# Patient Record
Sex: Male | Born: 1975 | Race: White | Hispanic: No | Marital: Single | Smoking: Former smoker
Health system: Southern US, Community
[De-identification: ages and names within clinical notes are randomized; demographics above are authoritative.]

## PROBLEM LIST (undated history)

## (undated) DIAGNOSIS — T07XXXA Unspecified multiple injuries, initial encounter: Secondary | ICD-10-CM

## (undated) HISTORY — DX: Unspecified multiple injuries, initial encounter: T07.XXXA

---

## 2004-05-05 ENCOUNTER — Ambulatory Visit (HOSPITAL_COMMUNITY): Admission: RE | Admit: 2004-05-05 | Discharge: 2004-05-05 | Payer: Self-pay | Admitting: Neurosurgery

## 2004-05-26 ENCOUNTER — Ambulatory Visit: Payer: Self-pay | Admitting: Cardiovascular Disease

## 2004-06-03 ENCOUNTER — Ambulatory Visit: Payer: Self-pay

## 2004-06-05 ENCOUNTER — Encounter: Admission: RE | Admit: 2004-06-05 | Discharge: 2004-06-05 | Payer: Self-pay | Admitting: Neurosurgery

## 2004-07-02 ENCOUNTER — Ambulatory Visit: Payer: Self-pay | Admitting: Cardiovascular Disease

## 2004-08-05 ENCOUNTER — Ambulatory Visit: Payer: Self-pay | Admitting: Cardiovascular Disease

## 2004-08-05 ENCOUNTER — Encounter: Admission: RE | Admit: 2004-08-05 | Discharge: 2004-08-05 | Payer: Self-pay | Admitting: Neurosurgery

## 2006-11-24 IMAGING — CR DG CERVICAL SPINE 1V
1 series · 1 of 1 positions shown · non-contrast
Comparison: 06/05/04.

CLINICAL DATA: Postop cervical fusion for follow up.
 CERVICAL SPINE, ONE VIEW:

[w c-spine lat]
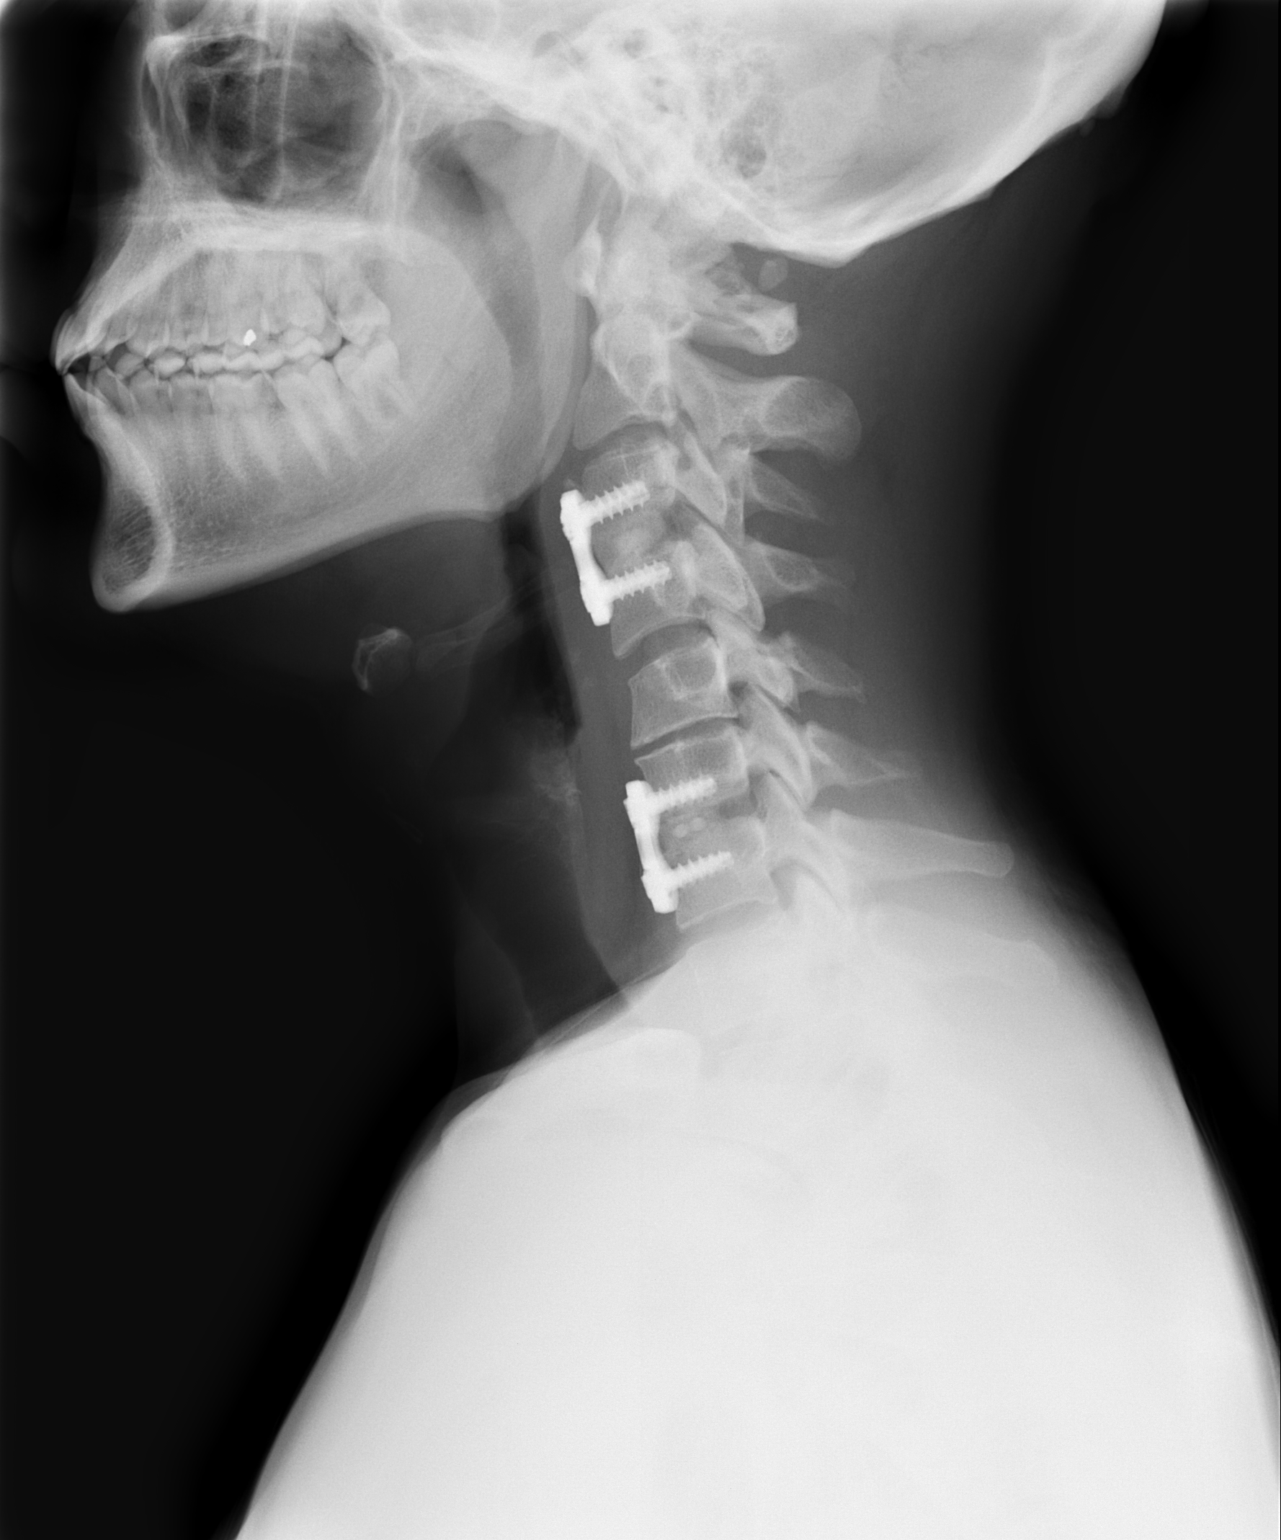

[1 of 1 positions shown; findings below may reference images not displayed]

FINDINGS: The patient is status post anterior cervical diskectomy and fusion at C3-4 and C6-7 with anterior plates and screws and intervertebral bone plugs.  The hardware is intact and remains in stable position.  There is stable straightening of the usual cervical lordosis and stable degenerative disk disease at C5-6.  No acute findings are demonstrated.
IMPRESSION: Intact hardware and stable alignment C3-4 and C6-7 anterior cervical diskectomy and fusion.

## 2013-12-13 ENCOUNTER — Encounter (HOSPITAL_COMMUNITY): Payer: Self-pay

## 2013-12-13 ENCOUNTER — Emergency Department (INDEPENDENT_AMBULATORY_CARE_PROVIDER_SITE_OTHER)
Admission: EM | Admit: 2013-12-13 | Discharge: 2013-12-13 | Disposition: A | Discharge status: Home / Self Care | Payer: PRIVATE HEALTH INSURANCE | Source: Home / Self Care | Attending: Family Medicine | Admitting: Family Medicine

## 2013-12-13 DIAGNOSIS — R109 Unspecified abdominal pain: Secondary | ICD-10-CM

## 2013-12-13 NOTE — Discharge Instructions (Signed)
Dietary changes as discussed, miralax to try, increase fluids and fiber. Return to ER if problem worsens.

## 2013-12-13 NOTE — ED Notes (Signed)
C/o has felt bloated and has had general sensation of discomfort since 11-1. Denies n/v/d/trauma. No one else in home ill. Last BM 2 days ago (normal ). Used pepto and alkaseltzer w minimal relief. Denies changes in stool

## 2013-12-13 NOTE — ED Provider Notes (Signed)
CSN: [id number]     Arrival date & time 12/13/13  1518 History   First MD Initiated Contact with Patient 12/13/13 1604     Chief Complaint  Patient presents with  . Bloated   (Consider location/radiation/quality/duration/timing/severity/associated sxs/prior Treatment) Patient is a 38 y.o. male presenting with abdominal pain. The history is provided by the patient.  Abdominal Pain Pain location:  RLQ Pain quality: bloating   Pain radiates to:  Does not radiate Pain severity:  Mild Onset quality:  Gradual Duration:  3 days Progression:  Unchanged Chronicity:  New Context: diet changes   Relieved by:  None tried Worsened by:  Nothing tried Associated symptoms: no anorexia, no diarrhea, no fever, no nausea and no vomiting     History reviewed. No pertinent past medical history. History reviewed. No pertinent past surgical history. History reviewed. No pertinent family history. History  Substance Use Topics  . Smoking status: Current Every Day Smoker  . Smokeless tobacco: Not on file  . Alcohol Use: Yes    Review of Systems  Constitutional: Positive for appetite change. Negative for fever.  HENT: Negative.   Respiratory: Negative.   Cardiovascular: Negative.   Gastrointestinal: Positive for abdominal pain. Negative for nausea, vomiting, diarrhea and anorexia.    Allergies  Review of patient's allergies indicates no known allergies.  Home Medications   Prior to Admission medications   Not on File   BP 165/93 mmHg  Pulse 90  Temp(Src) 98.3 F (36.8 C) (Oral)  SpO2 100% Physical Exam  Constitutional: He is oriented to person, place, and time. He appears well-developed and well-nourished.  Abdominal: Soft. Bowel sounds are normal. He exhibits no distension and no mass. There is no hepatosplenomegaly. There is tenderness in the right lower quadrant. There is no rigidity, no rebound, no guarding, no CVA tenderness and no tenderness at McBurney's point.     Neurological: He is alert and oriented to person, place, and time.  Skin: Skin is warm and dry.  Nursing note and vitals reviewed.   ED Course  Procedures (including critical care time) Labs Review Labs Reviewed - No data to display  Imaging Review No results found.   MDM   1. Abdominal pain in male        Billy Fischer, MD 12/13/13 [phone]

## 2014-04-14 DIAGNOSIS — T07XXXA Unspecified multiple injuries, initial encounter: Secondary | ICD-10-CM

## 2014-04-14 HISTORY — DX: Unspecified multiple injuries, initial encounter: T07.XXXA

## 2014-04-14 HISTORY — PX: SPLENECTOMY: SUR1306

## 2014-04-15 HISTORY — PX: SPINE SURGERY: SHX786

## 2014-06-04 ENCOUNTER — Encounter: Payer: Self-pay | Admitting: Rehabilitative and Restorative Service Providers"

## 2014-06-04 ENCOUNTER — Ambulatory Visit: Payer: PRIVATE HEALTH INSURANCE | Attending: Psychiatry | Admitting: Rehabilitative and Restorative Service Providers"

## 2014-06-04 ENCOUNTER — Ambulatory Visit: Payer: PRIVATE HEALTH INSURANCE | Admitting: Occupational Therapy

## 2014-06-04 VITALS — BP 138/87 | HR 92

## 2014-06-04 DIAGNOSIS — S22068G Other fracture of T7-T8 thoracic vertebra, subsequent encounter for fracture with delayed healing: Secondary | ICD-10-CM | POA: Diagnosis present

## 2014-06-04 DIAGNOSIS — M6281 Muscle weakness (generalized): Secondary | ICD-10-CM

## 2014-06-04 DIAGNOSIS — R5381 Other malaise: Secondary | ICD-10-CM

## 2014-06-04 DIAGNOSIS — S24153D Other incomplete lesion at T7-T10 level of thoracic spinal cord, subsequent encounter: Secondary | ICD-10-CM | POA: Diagnosis not present

## 2014-06-04 DIAGNOSIS — R269 Unspecified abnormalities of gait and mobility: Secondary | ICD-10-CM

## 2014-06-04 DIAGNOSIS — Z7409 Other reduced mobility: Secondary | ICD-10-CM

## 2014-06-04 DIAGNOSIS — S32485D Nondisplaced dome fracture of left acetabulum, subsequent encounter for fracture with routine healing: Secondary | ICD-10-CM
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
## 2014-06-04 NOTE — Patient Instructions (Signed)
Clam Shell 45 Degrees   Lying with hips and knees bent 45, one pillow between knees and ankles. Lift knee. Be sure pelvis does not roll backward. Do not arch back. Do _10__ times, rest and repeat 10 more on each leg, _1-2__ times per day.  http://ss.exer.us/74   Copyright  VHI. All rights reserved.  Gastroc / Heel Cord Stretch - Seated With Towel   Sit in wheelchair and prop leg on another chair or ottoman.  Place a towel around ball of foot. Gently pull foot in toward body, stretching heel cord and calf. Hold for _20__ seconds. Repeat on both legs. Repeat _3__ times. Do _1-2__ times per day.  Copyright  VHI. All rights reserved.   Continue using leg bike to tolerance 1x each day for activity.
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
## 2014-06-08 ENCOUNTER — Encounter: Payer: Self-pay | Admitting: Rehabilitative and Restorative Service Providers"

## 2014-06-08 ENCOUNTER — Ambulatory Visit: Payer: PRIVATE HEALTH INSURANCE | Admitting: Rehabilitative and Restorative Service Providers"

## 2014-06-08 DIAGNOSIS — S22068G Other fracture of T7-T8 thoracic vertebra, subsequent encounter for fracture with delayed healing: Secondary | ICD-10-CM | POA: Diagnosis not present

## 2014-06-08 DIAGNOSIS — M6281 Muscle weakness (generalized): Secondary | ICD-10-CM

## 2014-06-08 DIAGNOSIS — R5381 Other malaise: Secondary | ICD-10-CM

## 2014-06-08 DIAGNOSIS — Z7409 Other reduced mobility: Secondary | ICD-10-CM

## 2014-06-08 DIAGNOSIS — S24153D Other incomplete lesion at T7-T10 level of thoracic spinal cord, subsequent encounter: Secondary | ICD-10-CM

## 2014-06-08 DIAGNOSIS — R269 Unspecified abnormalities of gait and mobility: Secondary | ICD-10-CM

## 2014-06-08 NOTE — Therapy (Signed)
[facility] [address] [place], [place], [address] Phone: [phone]   Fax:  [phone]  Physical Therapy Treatment  Patient Details  Name: Richard Bridges MRN: [id number] Date of Birth: December 19, 1975 Referring Provider:  Terese Door, MD  Encounter Date: 06/08/2014      PT End of Session - 06/08/14 0948    Visit Number 2   Number of Visits 16   Date for PT Re-Evaluation 08/04/14   PT Start Time 0850   PT Stop Time 0930   PT Time Calculation (min) 40 min   Equipment Utilized During Treatment Gait belt   Activity Tolerance Patient tolerated treatment well   Behavior During Therapy [facility] for tasks assessed/performed      Past Medical History  Diagnosis Date  . Fractures involving multiple body regions 04/14/2014    s/p multi-trauma with pelvic and spinal fractures    Past Surgical History  Procedure Laterality Date  . Spine surgery  04/15/2014    laminectomy s/p multi-trauma  . 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 pt states he can feel the the exercises from his HEP strengthening his hips.    Patient is accompained by: Family member   Currently in Pain? No/denies      Therapeutic Exercise:  Hamstring and plantarflexor passive stretch, holding for 20 seconds X 3 Circle sitting "butterfly stretch" holding 20 seconds with trunk upright Heel slides on sliding  board, requiring assist to lower R LE slowly x 10 reps Hip abduction exercises on sliding board x 10 reps Pelvic tilts x 5 reps with assist to maintain stable knee flexion positon  Gait Training:  10 feet X 3 with +2 mod assist using parallel bars   Therapeutic Activity/Transfer Mobility: WC to mat <>mat to WC with supervison WC to standing at raised mat with +2 assist for safety, performing mini squats in stance x 5 reps                       PT Education - 06/08/14 0946    Education provided Yes   Education Details verbally reviewed HEP, heel slides with sliding board emphasizing slow eccentric lowering of R LE, circle sitting hip abductor stretch with back straight   Person(s) Educated Patient;Caregiver(s)   Methods Explanation;Demonstration;Tactile cues;Verbal cues;Handout   Comprehension Verbalized understanding;Returned demonstration          PT Short Term Goals - 06/04/14 2032    PT SHORT TERM GOAL #1   Title The patient will perform HEP with supervision from family for safety.   Baseline Target date 07/04/2014   Time 4   Period Weeks   PT SHORT TERM GOAL #2   Title The patient will move sit<>stand with CGA to RW.   Baseline Target date 07/04/2014   Time 4   Period Weeks   PT SHORT TERM GOAL #3   Title The patient will ambulate with RW and min A  x 50 feet.   Baseline Target date 07/04/2014   Time 4   Period Weeks   PT SHORT TERM GOAL #4   Title The patient will transfer w/c<>mat modified indep with boost transfer.   Baseline Target date 07/04/2014   Time 4   Period Weeks   PT SHORT TERM GOAL #5   Title The patient will maintain standing x 5 minutes for improved tolerance to LE weight bearing with contact guard assist.   Baseline Target date 07/04/2014   Time 4   Period Weeks   Additional Short Term Goals   Additional Short Term Goals Yes   PT SHORT TERM GOAL #6   Title The patient will perform car transfers without sliding board with CGA.    Baseline Target date 07/04/2014   Time 4   Period Weeks           PT Long Term Goals - 06/04/14 2037    PT LONG TERM GOAL #1   Title The patient will ambulate with RW x 200 ft with supervision for household ambulation.   Baseline Target date 08/04/2014   Time 8   Period Weeks   PT LONG TERM GOAL #2   Title The patient will perform standing activities x 10 minutes nonstop for return to ADL activities.   Baseline Target date 08/04/2014   Time 8   Period Weeks   PT LONG TERM GOAL #3   Title The patient will transfer sit<>stand to RW with supervsion.   Baseline Target date 08/04/2014   Time 8   Period Weeks   PT LONG TERM GOAL #4   Title The patient will have L LE strength 3/5 and R LE strength 2/5 (for hip flexion, bilat knee flexion/ext).    Baseline Target date 08/04/2014   Time 8   Period Weeks               Plan - 06/08/14 0950    Clinical Impression Statement pt tolerated treatment well today and demonstrated improvement with advancing R LE during gait at parallel bars. pt continues to have significant tightness in bilateral plantarflexors, so emphasis was placed on pt lowering heels to ground during stance and gait. During session pt attempted to stand at counter with mod assist for safety, however pt had difficulty fully extending hips at counter to maintain stance due to weakness. pt demonstrated better control with stance using elevated mat and mod assist.  Bilat LE muscles spasms were observed towards end of treatment due to fatigue.    Pt will benefit from skilled therapeutic intervention in order to improve on the following deficits Abnormal gait;Decreased balance;Decreased mobility;Decreased activity tolerance;Decreased range of motion;Difficulty walking;Impaired flexibility;Impaired sensation;Impaired tone;Increased muscle spasms;Decreased strength;Pain   Rehab Potential Good   PT Frequency 2x / week   PT Duration 8 weeks   PT Treatment/Interventions ADLs/Self Care  Home Management;Electrical Stimulation;Functional mobility training;Therapeutic activities;Patient/family education;Passive range of motion;Wheelchair mobility training;Therapeutic exercise;DME Instruction;Gait training;Balance training;Neuromuscular re-education;Stair training   PT Next Visit Plan continue with standing tolerance and  mini squats with assist at elevated mat, gait training +2 for safety, LE streching and strengthening exercises emphasizing heelcord flexibility   Consulted and Agree with Plan of Care Patient;Family member/caregiver        Problem List There are no active problems to display for this patient.  This entire session was performed under direct supervision and direction of a licensed therapist/therapist assistant . I have personally read, edited and approve of the note as  written. WEAVER,CHRISTINA, PT   Fanny Dance, 06/08/2014, 10:01 AM  [facility] [address] [place], [place], [address] Phone: [phone]   Fax:  [phone]

## 2014-06-08 NOTE — Patient Instructions (Signed)
Butterfly, Sitting   Sit straight or with back against wall. Gently push knees toward floor. Hold __20_ seconds. Repeat _3__ times per session. Do _1-2__ sessions per day.  Copyright  VHI. All rights reserved.

## 2014-06-12 ENCOUNTER — Ambulatory Visit: Payer: PRIVATE HEALTH INSURANCE | Attending: Psychiatry | Admitting: Occupational Therapy

## 2014-06-12 ENCOUNTER — Encounter: Payer: Self-pay | Admitting: Occupational Therapy

## 2014-06-12 DIAGNOSIS — M6281 Muscle weakness (generalized): Secondary | ICD-10-CM | POA: Diagnosis present

## 2014-06-12 DIAGNOSIS — R5381 Other malaise: Secondary | ICD-10-CM | POA: Diagnosis not present

## 2014-06-12 NOTE — Patient Instructions (Signed)
  (  Clinic) Retraction: Row - Bilateral (Pulley)   Facing pulley, arms reaching forward, pull hands toward stomach, pinching shoulder blades together. Repeat _10___ times per set. Do __1__ sets per session. Do __2__ sessions per day, every other day.   (Home) Extension / Flexion (Assist)   Face anchor, both arms as far forward and up as is pain free. Pull arms down toward side. Repeat _10___ times per set. Do ___1_ sets per session. Do _2___ sessions per day, every other day.    Resisted Horizontal Abduction: Bilateral   Sit or stand, tubing in both hands, arms out in front. Keeping arms straight, pinch shoulder blades together and stretch arms out. Repeat _10___ times per set. Do _1-2___ sessions per day, every other day.   SHOULDER: Flexion Unilateral (Band)   Start with arm at side. Holding band, raise arm forward and up. Keep elbow straight. Hold ___ seconds. Use ___red_____ band. _10__ reps per set, _2__ sets per day, every other day     Elbow Flexion: Resisted   With tubing held in ______ hand(s) and other end secured under foot, curl arm up as far as possible. Repeat _10___ times per set. Do _1-2___ sessions per day, every other day.    Elbow Extension: Resisted   Sit in chair with resistive band secured at armrest (or hold with other hand) and _______ elbow bent. Straighten elbow. Repeat _10___ times per set.  Do _1-2___ sessions per day, every other day.

## 2014-06-12 NOTE — Therapy (Signed)
[facility] [address] [place], [place], [address] Phone: [phone]   Fax:  [phone]  Occupational Therapy Treatment  Patient Details  Name: Richard Bridges MRN: [id number] Date of Birth: August 25, 1975 Referring Provider:  Terese Door, MD  Encounter Date: 06/12/2014      OT End of Session - 06/12/14 1300    Visit Number 2   Number of Visits 17   Date for OT Re-Evaluation 08/04/14   Authorization Type [employer]   Authorization Time Period no visit limit - week 1/8   OT Start Time 1145   OT Stop Time 1230   OT Time Calculation (min) 45 min   Activity Tolerance Patient tolerated treatment well      Past Medical History  Diagnosis Date  . Fractures involving multiple body regions 04/14/2014    s/p multi-trauma with pelvic and spinal fractures    Past Surgical History  Procedure Laterality Date  . Spine surgery  04/15/2014    laminectomy s/p multi-trauma  . 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  Visit Diagnosis:  Generalized muscle weakness  Physical deconditioning      Subjective Assessment - 06/12/14 1208    Patient is accompained by: Family member  sister   Pertinent History multi-trauma and T11-12 Laminectomy from accident on 04/14/14, spleenectomy   Currently in Pain? No/denies                      OT Treatments/Exercises (OP) - 06/12/14 1258    Exercises   Exercises Shoulder   Shoulder Exercises: ROM/Strengthening   UBE (Upper Arm Bike) UBE x 10 min. Level 3 for strength/endurance   Other ROM/Strengthening Exercises Theraband HEP issued (see pt instructions) to maintain shoulder integrity bilaterally. Pt performed each x 10 reps bilateral UE's                OT Education - 06/12/14 1226    Education provided Yes   Education Details UE Theraband HEP    Person(s) Educated Patient;Caregiver(s)   Methods  Explanation;Demonstration;Handout   Comprehension Verbalized understanding;Returned demonstration          OT Short Term Goals - 06/04/14 1348    OT SHORT TERM GOAL #1   Title independent w/ UE HEP (DUE 07/04/14)   Time 4   Period Weeks   Status New   OT SHORT TERM GOAL #2   Title Independent w/ LE dressing (circle and long sitting to don pants and socks, sitting EOB to don shoes w/ A/E)   Time 4   Period Weeks   Status New   OT SHORT TERM GOAL #3   Title Pt to perform simulated shower transfer with necessary DME with supervison only   Time 4   Period Weeks   Status New           OT Long Term Goals - 06/04/14 1351    OT LONG TERM GOAL #1   Title Pt to perform grooming activities in standing at sink for 10 minutes w/o rest (due 08/04/14 unless placed on hold)   Time 8   Period Weeks   Status New   OT LONG TERM GOAL #2   Title Pt to stand consistently to retrieve objects out of cabinets and stand at stove for cooking tasks using w/c only as needed and countertop support   Time 8   Period Weeks  Status New   OT LONG TERM GOAL #3   Title Pt to perform laundry tasks mod I level in standing   Time 8   Period Weeks   Status New               Plan - 06/12/14 1300    Clinical Impression Statement Pt tolerating UE HEP well with no pain. Pt with increased bilateral LE movement for transfers and LB ADLS   Plan Long sitting and circle sitting for LE dressing in bed (pants and socks), don shoes EOB, simulate shower transfer using tub bench        Problem List There are no active problems to display for this patient.   Carey Bullocks, OTR/L 06/12/2014, 1:04 PM  [facility] [address] [place], [place], [address] Phone: [phone]   Fax:  [phone]

## 2014-06-13 ENCOUNTER — Ambulatory Visit: Payer: PRIVATE HEALTH INSURANCE | Admitting: Physical Therapy

## 2014-06-13 DIAGNOSIS — M6281 Muscle weakness (generalized): Secondary | ICD-10-CM

## 2014-06-13 DIAGNOSIS — Z7409 Other reduced mobility: Secondary | ICD-10-CM

## 2014-06-13 DIAGNOSIS — R269 Unspecified abnormalities of gait and mobility: Secondary | ICD-10-CM

## 2014-06-13 NOTE — Therapy (Addendum)
[facility] [address] [place], [place], [address] Phone: [phone]   Fax:  [phone]  Physical Therapy Treatment  Patient Details  Name: Richard Bridges MRN: [id number] Date of Birth: 08-27-1975 Referring Provider:  Terese Door, MD  Encounter Date: 06/13/2014      PT End of Session - 06/13/14 1408    Visit Number 3   Number of Visits 16   Date for PT Re-Evaluation 08/04/14   Authorization Type $35 copay, no visit limit   PT Start Time 1230   PT Stop Time 1315   PT Time Calculation (min) 45 min   Equipment Utilized During Treatment Gait belt   Activity Tolerance Patient tolerated treatment well   Behavior During Therapy [facility] for tasks assessed/performed      Past Medical History  Diagnosis Date  . Fractures involving multiple body regions 04/14/2014    s/p multi-trauma with pelvic and spinal fractures    Past Surgical History  Procedure Laterality Date  . Spine surgery  04/15/2014    laminectomy s/p multi-trauma  . 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  Visit Diagnosis:  Generalized muscle weakness  Abnormality of gait  Decreased functional mobility and endurance      Subjective Assessment - 06/13/14 1400    Subjective pt presents to clinic today in no pain and reports he has been compliant with HEP. pt states he is excited to stand and walk today during session.    Patient is accompained by: Family member   Patient Stated Goals Getting back to walking.   Currently in Pain? No/denies       TREATMENTS  Gait Training:   Parallel bar walking x 3 laps with +1 Min assist for safety and to keep knees locked into extension. Pt demonstrated increased ability to swing right hip into flexion during gait.     Therapeutic Ex: -PROM supine hamstring/heel cord stretch on mat to improve LE flexibility. 30 second holds x 2 bilaterally. -Heel slides X  10 bilaterally supine on mat with AAROM.  -peanut physioball exercises on mat with CGA. Pt performed maintaining balance in quadriped position with peanut ball under trunk and alternating extending UEs into flexion x 10 reps. Pt also practiced tall kneeling against peanut ball and performing trunk extensions with lateral weight shifting.  -prone AAROM knee flexion exercises x 10 reps bilaterally   Therapeutic Activity: Sit to stand transfer to sink with +1 min assist to build standing endurance and increase LE strength. Pt maintained stance for approx 1 min X 2 reps.         PT Education - 06/13/14 1406    Education provided Yes   Education Details standing at kitchen sink added to HEP- pt and family instructed on how to perform in safe manner for an appropriate amount of time    Person(s) Educated Patient;Caregiver(s)   Methods Explanation;Demonstration;Verbal cues   Comprehension Verbalized understanding;Returned demonstration          PT Short Term Goals - 06/04/14 2032    PT SHORT TERM GOAL #1   Title The patient will perform HEP with supervision from family for safety.   Baseline Target date 07/04/2014   Time 4   Period Weeks   PT SHORT TERM GOAL #2   Title The patient will move sit<>stand with CGA to RW.   Baseline Target date 07/04/2014   Time 4  Period Weeks   PT SHORT TERM GOAL #3   Title The patient will ambulate with RW and min A x 50 feet.   Baseline Target date 07/04/2014   Time 4   Period Weeks   PT SHORT TERM GOAL #4   Title The patient will transfer w/c<>mat modified indep with boost transfer.   Baseline Target date 07/04/2014   Time 4   Period Weeks   PT SHORT TERM GOAL #5   Title The patient will maintain standing x 5 minutes for improved tolerance to LE weight bearing with contact guard assist.   Baseline Target date 07/04/2014   Time 4   Period Weeks   Additional Short Term Goals   Additional Short Term Goals Yes   PT SHORT TERM GOAL #6   Title The  patient will perform car transfers without sliding board with CGA.   Baseline Target date 07/04/2014   Time 4   Period Weeks           PT Long Term Goals - 06/04/14 2037    PT LONG TERM GOAL #1   Title The patient will ambulate with RW x 200 ft with supervision for household ambulation.   Baseline Target date 08/04/2014   Time 8   Period Weeks   PT LONG TERM GOAL #2   Title The patient will perform standing activities x 10 minutes nonstop for return to ADL activities.   Baseline Target date 08/04/2014   Time 8   Period Weeks   PT LONG TERM GOAL #3   Title The patient will transfer sit<>stand to RW with supervsion.   Baseline Target date 08/04/2014   Time 8   Period Weeks   PT LONG TERM GOAL #4   Title The patient will have L LE strength 3/5 and R LE strength 2/5 (for hip flexion, bilat knee flexion/ext).    Baseline Target date 08/04/2014   Time 8   Period Weeks           Plan - 06/13/14 1409    Clinical Impression Statement pt is progressing towards goals. pt demonstrated further improvement with advancing R LE during gait at parallel bars, and only +1 assist was required for stabilization. pt demonstrated decreased difficulty with standing to sink, requiring only +1 assist this time and maintaing stance for approximately 1 minute. pt tolerated strengthening exercises well. Bilat LE muscle spasms still present during treatment session from fatigue.    Pt will benefit from skilled therapeutic intervention in order to improve on the following deficits Abnormal gait;Decreased balance;Decreased mobility;Decreased activity tolerance;Decreased range of motion;Difficulty walking;Impaired flexibility;Impaired sensation;Impaired tone;Increased muscle spasms;Decreased strength;Pain   Rehab Potential Good   PT Frequency 2x / week   PT Duration 8 weeks   PT Treatment/Interventions ADLs/Self Care Home Management;Electrical Stimulation;Functional mobility training;Therapeutic  activities;Patient/family education;Passive range of motion;Wheelchair mobility training;Therapeutic exercise;DME Instruction;Gait training;Balance training;Neuromuscular re-education;Stair training   PT Next Visit Plan continue increasing standing tolerance at sink, gait training +1 at parallel bars, LE flexibility and strengthening exercises, core/trunk stability exercises    Consulted and Agree with Plan of Care Patient;Family member/caregiver   Family Member Consulted sister        Problem List There are no active problems to display for this patient.   Fanny Dance 06/13/2014, 2:15 PM  This entire session was performed under direct supervision and direction of a licensed therapist/therapist assistant . I have personally read, edited and approve of the note as written.  Willow Ora, PTA, CLT  [facility] [address] [place], [place] [address] [phone] 06/14/2014, 2:03 PM

## 2014-06-14 ENCOUNTER — Encounter: Payer: Self-pay | Admitting: Occupational Therapy

## 2014-06-14 ENCOUNTER — Ambulatory Visit: Payer: PRIVATE HEALTH INSURANCE | Admitting: Occupational Therapy

## 2014-06-14 DIAGNOSIS — M6281 Muscle weakness (generalized): Secondary | ICD-10-CM

## 2014-06-14 DIAGNOSIS — Z7409 Other reduced mobility: Secondary | ICD-10-CM

## 2014-06-14 DIAGNOSIS — R5381 Other malaise: Secondary | ICD-10-CM

## 2014-06-14 NOTE — Therapy (Signed)
[facility] [address] [place], [place], [address] Phone: [phone]   Fax:  [phone]  Occupational Therapy Treatment  Patient Details  Name: Richard Bridges MRN: [id number] Date of Birth: 05-19-1975 Referring Provider:  Terese Door, MD  Encounter Date: 06/14/2014      OT End of Session - 06/14/14 0927    Visit Number 3   Number of Visits 17   Date for OT Re-Evaluation 08/04/14   Authorization Type [employer]   Authorization Time Period no visit limit - week 1/8   OT Start Time 0845   OT Stop Time 0930   OT Time Calculation (min) 45 min   Activity Tolerance Patient tolerated treatment well      Past Medical History  Diagnosis Date  . Fractures involving multiple body regions 04/14/2014    s/p multi-trauma with pelvic and spinal fractures    Past Surgical History  Procedure Laterality Date  . Spine surgery  04/15/2014    laminectomy s/p multi-trauma  . 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  Visit Diagnosis:  Generalized muscle weakness  Decreased functional mobility and endurance  Physical deconditioning      Subjective Assessment - 06/14/14 0848    Subjective  "My legs are improving"   Patient is accompained by: Family member  brother-n-law   Pertinent History multi-trauma and T11-12 Laminectomy from accident on 04/14/14, spleenectomy   Currently in Pain? No/denies                      OT Treatments/Exercises (OP) - 06/14/14 8527    Transfers   Comments Simulated shower transfer with set up similar to what he will have to do. Pt able to transfer to bench and back to w/c without assist, but did recommend supervision secondary to space required b/t w/c and bench for home set up   ADLs   LB Dressing Pt able to don/doff shoes, socks and pants at EOB! Pt did not need to perform long sitting or circle sitting (but he has been doing at  home this way independently)!   Shoulder Exercises: ROM/Strengthening   UBE (Upper Arm Bike) UBE x 10 min. Level 5 for strength/endurance   Other ROM/Strengthening Exercises Reviewed all theraband ex's from HEP issued previous session for BUE's. Pt performed each ex x 10 reps bilaterally with occasional min cues for clarification   Other ROM/Strengthening Exercises Wheelchair push ups x 10 reps                  OT Short Term Goals - 06/14/14 0928    OT SHORT TERM GOAL #1   Title independent w/ UE HEP (DUE 07/04/14)   Time 4   Period Weeks   Status Achieved  met 06/14/14   OT SHORT TERM GOAL #2   Title Independent w/ LE dressing (circle and long sitting to don pants and socks, sitting EOB to don shoes w/ A/E)   Time 4   Period Weeks   Status Achieved  Pt can now do all EOB!! (as of 06/14/14)   OT SHORT TERM GOAL #3   Title Pt to perform simulated shower transfer with necessary DME with supervison only   Time 4   Period Weeks   Status Achieved  met 06/14/14           OT Long Term Goals - 06/04/14 1351  OT LONG TERM GOAL #1   Title Pt to perform grooming activities in standing at sink for 10 minutes w/o rest (due 08/04/14 unless placed on hold)   Time 8   Period Weeks   Status New   OT LONG TERM GOAL #2   Title Pt to stand consistently to retrieve objects out of cabinets and stand at stove for cooking tasks using w/c only as needed and countertop support   Time 8   Period Weeks   Status New   OT LONG TERM GOAL #3   Title Pt to perform laundry tasks mod I level in standing   Time 8   Period Weeks   Status New               Plan - 06/14/14 0929    Clinical Impression Statement Pt met all STG's today! Pt with improved mobility, leg management for transfers and LB ADLS!   Plan Begin working on static standing in prep for standing ADLS. May place on hold after next week until further progression can be made with standing tolerance   Consulted and Agree with  Plan of Care Patient;Family member/caregiver        Problem List There are no active problems to display for this patient.   Carey Bullocks, OTR/L 06/14/2014, 9:34 AM  [facility] [address] [place], [place], [address] Phone: [phone]   Fax:  [phone]

## 2014-06-15 ENCOUNTER — Ambulatory Visit: Payer: PRIVATE HEALTH INSURANCE | Admitting: Physical Therapy

## 2014-06-15 DIAGNOSIS — R5381 Other malaise: Secondary | ICD-10-CM

## 2014-06-15 DIAGNOSIS — M6281 Muscle weakness (generalized): Secondary | ICD-10-CM

## 2014-06-15 DIAGNOSIS — R269 Unspecified abnormalities of gait and mobility: Secondary | ICD-10-CM

## 2014-06-15 DIAGNOSIS — Z7409 Other reduced mobility: Secondary | ICD-10-CM

## 2014-06-15 NOTE — Therapy (Signed)
[facility] [address] [place], [place], [address] Phone: [phone]   Fax:  [phone]  Physical Therapy Treatment  Patient Details  Name: Richard Bridges MRN: [id number] Date of Birth: May 01, 1975 Referring Provider:  Terese Door, MD  Encounter Date: 06/15/2014      PT End of Session - 06/15/14 1135    Visit Number 4   Number of Visits 16   Date for PT Re-Evaluation 08/04/14   Authorization Type $35 copay, no visit limit   PT Start Time 0845   PT Stop Time 0930   PT Time Calculation (min) 45 min   Equipment Utilized During Treatment Gait belt   Activity Tolerance Patient tolerated treatment well   Behavior During Therapy [facility] for tasks assessed/performed      Past Medical History  Diagnosis Date  . Fractures involving multiple body regions 04/14/2014    s/p multi-trauma with pelvic and spinal fractures    Past Surgical History  Procedure Laterality Date  . Spine surgery  04/15/2014    laminectomy s/p multi-trauma  . 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  Visit Diagnosis:  Abnormality of gait  Generalized muscle weakness  Decreased independence with transfers  Physical deconditioning  Decreased functional mobility and endurance      Subjective Assessment - 06/15/14 1126    Subjective pt presents to clinic today in no pain and reports no falls. pt states that he has not stood since last PT session due to fatigue and since he had OT yesterday.    Patient is accompained by: Family member   Patient Stated Goals Getting back to walking.   Currently in Pain? No/denies       Treatments  Gait Training: 1 lap at parallel bars with +1 assist for safety/balance emphasizing bearing weight through LEs and full advancements of LEs during swing phase.  Neuromuscular Re-education:  Static balance at parallel bars with +1 assist for safety/balance,  practicing weight shifting laterally and anterior/posteriorly, practicing coordination/weight shifting with UE movements, and practicing maintaining balance with trunk turns.   Balance training/ trunk strengthening sitting on dynamic disc on mat with feet supported. Pt practiced weight shifting laterally and anterior/posterior with CGA.   Therapeutic Exercise:  Longsitting hamstring/heelcord stretch using belt to control amount of stretch. 30 second holds bilat X 2. Pt instructed on how to complete stretch at home.  Prone hip flexor stretch with min assist. Pt's sister instructed how to complete stretch at home with pt.     Therapeutic Activity:  Transfers from raised mat <> RW x 3 with +1 min assist using hip support belt           PT Education - 06/15/14 1134    Education provided Yes   Education Details HEP reviewed: long sitting hamstring strech, prone hip flexor stretch with assist    Person(s) Educated Patient;Caregiver(s)   Methods Explanation;Demonstration;Verbal cues   Comprehension Verbalized understanding;Returned demonstration          PT Short Term Goals - 06/04/14 2032    PT SHORT TERM GOAL #1   Title The patient will perform HEP with supervision from family for safety.   Baseline Target date 07/04/2014   Time 4   Period Weeks   PT SHORT TERM GOAL #2   Title The patient will move sit<>stand with CGA to RW.   Baseline Target date 07/04/2014   Time 4  Period Weeks   PT SHORT TERM GOAL #3   Title The patient will ambulate with RW and min A x 50 feet.   Baseline Target date 07/04/2014   Time 4   Period Weeks   PT SHORT TERM GOAL #4   Title The patient will transfer w/c<>mat modified indep with boost transfer.   Baseline Target date 07/04/2014   Time 4   Period Weeks   PT SHORT TERM GOAL #5   Title The patient will maintain standing x 5 minutes for improved tolerance to LE weight bearing with contact guard assist.   Baseline Target date 07/04/2014   Time 4    Period Weeks   Additional Short Term Goals   Additional Short Term Goals Yes   PT SHORT TERM GOAL #6   Title The patient will perform car transfers without sliding board with CGA.   Baseline Target date 07/04/2014   Time 4   Period Weeks           PT Long Term Goals - 06/04/14 2037    PT LONG TERM GOAL #1   Title The patient will ambulate with RW x 200 ft with supervision for household ambulation.   Baseline Target date 08/04/2014   Time 8   Period Weeks   PT LONG TERM GOAL #2   Title The patient will perform standing activities x 10 minutes nonstop for return to ADL activities.   Baseline Target date 08/04/2014   Time 8   Period Weeks   PT LONG TERM GOAL #3   Title The patient will transfer sit<>stand to RW with supervsion.   Baseline Target date 08/04/2014   Time 8   Period Weeks   PT LONG TERM GOAL #4   Title The patient will have L LE strength 3/5 and R LE strength 2/5 (for hip flexion, bilat knee flexion/ext).    Baseline Target date 08/04/2014   Time 8   Period Weeks            Plan - 06/15/14 1139    Clinical Impression Statement pt is progressing towards goals. pt stood to RW today with +1 min assist with hip support belt and was able to maintain stance for approx 2 minutes. pt demonstrated good trunk stability at parallel bars today. Good response with new hamstring stretch in sitting position with back supported.    Pt will benefit from skilled therapeutic intervention in order to improve on the following deficits Abnormal gait;Decreased balance;Decreased mobility;Decreased activity tolerance;Decreased range of motion;Difficulty walking;Impaired flexibility;Impaired sensation;Impaired tone;Increased muscle spasms;Decreased strength;Pain   Rehab Potential Good   PT Frequency 2x / week   PT Duration 8 weeks   PT Treatment/Interventions ADLs/Self Care Home Management;Electrical Stimulation;Functional mobility training;Therapeutic activities;Patient/family  education;Passive range of motion;Wheelchair mobility training;Therapeutic exercise;DME Instruction;Gait training;Balance training;Neuromuscular re-education;Stair training   PT Next Visit Plan continue increasing standing tolerance at sink, gait training +1 at parallel bars, transfers from raised mat <> RW. LE flexibility and strengthening exercises, core/trunk stability exercises    Consulted and Agree with Plan of Care Patient;Family member/caregiver   Family Member Consulted sister        Problem List There are no active problems to display for this patient.   Fanny Dance 06/15/2014, 11:44 AM  [facility] [address] [place], [place], [address] Phone: [phone]   Fax:  [phone]

## 2014-06-18 ENCOUNTER — Ambulatory Visit: Payer: PRIVATE HEALTH INSURANCE | Admitting: Occupational Therapy

## 2014-06-18 ENCOUNTER — Ambulatory Visit: Payer: PRIVATE HEALTH INSURANCE | Admitting: Rehabilitative and Restorative Service Providers"

## 2014-06-18 DIAGNOSIS — M6281 Muscle weakness (generalized): Secondary | ICD-10-CM

## 2014-06-18 DIAGNOSIS — R269 Unspecified abnormalities of gait and mobility: Secondary | ICD-10-CM

## 2014-06-18 DIAGNOSIS — R5381 Other malaise: Secondary | ICD-10-CM

## 2014-06-18 DIAGNOSIS — Z789 Other specified health status: Secondary | ICD-10-CM

## 2014-06-18 DIAGNOSIS — Z7409 Other reduced mobility: Secondary | ICD-10-CM

## 2014-06-18 NOTE — Therapy (Signed)
[facility] [address] [place], [place], [address] Phone: [phone]   Fax:  [phone]  Physical Therapy Treatment  Patient Details  Name: Richard Bridges MRN: [id number] Date of Birth: 06/14/1975 Referring Provider:  Terese Door, MD  Encounter Date: 06/18/2014      PT End of Session - 06/18/14 1252    Visit Number 5   Number of Visits 16   Date for PT Re-Evaluation 08/04/14   Authorization Type $35 copay, no visit limit   PT Start Time 1100   PT Stop Time 1145   PT Time Calculation (min) 45 min   Equipment Utilized During Treatment Gait belt   Activity Tolerance Patient tolerated treatment well   Behavior During Therapy [facility] for tasks assessed/performed      Past Medical History  Diagnosis Date  . Fractures involving multiple body regions 04/14/2014    s/p multi-trauma with pelvic and spinal fractures    Past Surgical History  Procedure Laterality Date  . Spine surgery  04/15/2014    laminectomy s/p multi-trauma  . 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  Visit Diagnosis:  Generalized muscle weakness  Physical deconditioning  Decreased functional mobility and endurance  Abnormality of gait  Decreased independence with transfers  Impaired mobility and ADLs      Subjective Assessment - 06/18/14 1246    Subjective pt presents to clinic today in no pain and reports no recent falls. pt states he has been standing at counter at home for 30-45 second intervals. pt has been compliant with stretching HEP.   Patient is accompained by: Family member   Patient Stated Goals Getting back to walking.   Currently in Pain? No/denies       Treatment   Gait training: 10 feet with RW and min assist to keep R LE in proper alignment (focus on R knee and ankle control) and to assist with advancing RW. Wheelchair pushed behind patient for safety.   Neuro  Re-Ed: -10 reps of mini squats at counter with min A, with cues for slow, controlled eccentric lowering of hips and slow contraction of hip musculature to prevent knee from "snapping" into extension.  -10 reps of lateral weight shifts at counter with CGA with cues to keep heels fully on the ground and weight fully beared through LEs. - mat <> RW transfer with CGA and cues for correct foot and hand placement. Pt instructed to only place one hand on walker when perfoming transfer.    Therapeutic Exercise: -10 reps of bridges with pelvic tilts on mat with ball between knees and assist to keep feet stabilized on mat -10 reps of supine trunk rotations on mat with knees together -10 reps supine hip ab/adduction in hooklying -P/ROM hamstring/heelcord stretch supine on mat with 20 second holds in each stretch x 2 bilaterally  -stool to push posterior and pull anteriorly with assist working on hamstring and quad control        PT Education - 06/18/14 1249    Education provided Yes   Education Details verbally reviewed HEP   Person(s) Educated Patient;Caregiver(s)   Methods Explanation;Demonstration   Comprehension Verbalized understanding          PT Short Term Goals - 06/04/14 2032    PT SHORT TERM GOAL #1   Title The patient will perform HEP with supervision from family for safety.   Baseline Target date 07/04/2014  Time 4   Period Weeks   PT SHORT TERM GOAL #2   Title The patient will move sit<>stand with CGA to RW.   Baseline Target date 07/04/2014   Time 4   Period Weeks   PT SHORT TERM GOAL #3   Title The patient will ambulate with RW and min A x 50 feet.   Baseline Target date 07/04/2014   Time 4   Period Weeks   PT SHORT TERM GOAL #4   Title The patient will transfer w/c<>mat modified indep with boost transfer.   Baseline Target date 07/04/2014   Time 4   Period Weeks   PT SHORT TERM GOAL #5   Title The patient will maintain standing x 5 minutes for improved tolerance to  LE weight bearing with contact guard assist.   Baseline Target date 07/04/2014   Time 4   Period Weeks   Additional Short Term Goals   Additional Short Term Goals Yes   PT SHORT TERM GOAL #6   Title The patient will perform car transfers without sliding board with CGA.   Baseline Target date 07/04/2014   Time 4   Period Weeks           PT Long Term Goals - 06/04/14 2037    PT LONG TERM GOAL #1   Title The patient will ambulate with RW x 200 ft with supervision for household ambulation.   Baseline Target date 08/04/2014   Time 8   Period Weeks   PT LONG TERM GOAL #2   Title The patient will perform standing activities x 10 minutes nonstop for return to ADL activities.   Baseline Target date 08/04/2014   Time 8   Period Weeks   PT LONG TERM GOAL #3   Title The patient will transfer sit<>stand to RW with supervsion.   Baseline Target date 08/04/2014   Time 8   Period Weeks   PT LONG TERM GOAL #4   Title The patient will have L LE strength 3/5 and R LE strength 2/5 (for hip flexion, bilat knee flexion/ext).    Baseline Target date 08/04/2014   Time 8   Period Weeks               Plan - 06/18/14 1253    Clinical Impression Statement pt is making steady progress towards goals. pt was able to ambulate approximately 10 feet with RW and min assist to keep knees in proper alignment and a chair readily available. pt fatigued with treatment session today, as today's treatment focused on LE strengthening and endurance with stance and gait.    Pt will benefit from skilled therapeutic intervention in order to improve on the following deficits Abnormal gait;Decreased balance;Decreased mobility;Decreased activity tolerance;Decreased range of motion;Difficulty walking;Impaired flexibility;Impaired sensation;Impaired tone;Increased muscle spasms;Decreased strength;Pain   Rehab Potential Good   PT Frequency 2x / week   PT Duration 8 weeks   PT Treatment/Interventions ADLs/Self Care Home  Management;Electrical Stimulation;Functional mobility training;Therapeutic activities;Patient/family education;Passive range of motion;Wheelchair mobility training;Therapeutic exercise;DME Instruction;Gait training;Balance training;Neuromuscular re-education;Stair training   PT Next Visit Plan continue increasing standing tolerance at sink, gait training with RW, transfers from raised mat <> RW. LE flexibility and strengthening exercises, core/trunk stability exercises, balance and weightshiting activities   Consulted and Agree with Plan of Care Patient;Family member/caregiver   Family Member Consulted sister     This entire session was performed under direct supervision and direction of a licensed Chiropractor . I have personally read, edited  and approve of the note as written. WEAVER,CHRISTINA, PT    Problem List There are no active problems to display for this patient.   Fanny Dance 06/18/2014, 12:59 PM  [facility] [address] [place], [place], [address] Phone: [phone]   Fax:  [phone]

## 2014-06-18 NOTE — Therapy (Signed)
[facility] [address] [place], [place], [address] Phone: [phone]   Fax:  [phone]  Occupational Therapy Treatment  Patient Details  Name: Richard Bridges MRN: [id number] Date of Birth: [birth year] Referring Provider:  Terese Door, MD  Encounter Date: 06/18/2014      OT End of Session - 06/18/14 1224    Visit Number 4   Number of Visits 17   Date for OT Re-Evaluation 08/04/14   Authorization Type [employer]   Authorization Time Period no visit limit - week 2/8   OT Start Time 1150   OT Stop Time 1220   OT Time Calculation (min) 30 min   Equipment Utilized During Treatment GAIT BELT   Activity Tolerance Patient tolerated treatment well      Past Medical History  Diagnosis Date  . Fractures involving multiple body regions 04/14/2014    s/p multi-trauma with pelvic and spinal fractures    Past Surgical History  Procedure Laterality Date  . Spine surgery  04/15/2014    laminectomy s/p multi-trauma  . 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  Visit Diagnosis:  Generalized muscle weakness  Physical deconditioning  Decreased functional mobility and endurance      Subjective Assessment - 06/18/14 1210    Patient is accompained by: Family member  sister   Pertinent History multi-trauma and T11-12 Laminectomy from accident on 04/14/14, spleenectomy   Currently in Pain? No/denies                      OT Treatments/Exercises (OP) - 06/18/14 0001    ADLs   Functional Mobility Standing at sink x 2 with fatigue in LE's and clonus (pt tired from P.T. session as well) -  1st trial: pt unable to disengage UE's, 2nd trial: able to stand slightly longer, then disengage UE alternating b/t RUE and LUE  (However cannot reach outside BOS)   Shoulder Exercises: ROM/Strengthening   UBE (Upper Arm Bike) UBE x 10 min. Level 5 for strength/endurance (5 minutes  forward, 5 minutes backwards)                  OT Short Term Goals - 06/14/14 0928    OT SHORT TERM GOAL #1   Title independent w/ UE HEP (DUE 07/04/14)   Time 4   Period Weeks   Status Achieved  met 06/14/14   OT SHORT TERM GOAL #2   Title Independent w/ LE dressing (circle and long sitting to don pants and socks, sitting EOB to don shoes w/ A/E)   Time 4   Period Weeks   Status Achieved  Pt can now do all EOB!! (as of 06/14/14)   OT SHORT TERM GOAL #3   Title Pt to perform simulated shower transfer with necessary DME with supervison only   Time 4   Period Weeks   Status Achieved  met 06/14/14           OT Long Term Goals - 06/04/14 1351    OT LONG TERM GOAL #1   Title Pt to perform grooming activities in standing at sink for 10 minutes w/o rest (due 08/04/14 unless placed on hold)   Time 8   Period Weeks   Status New   OT LONG TERM GOAL #2   Title Pt to stand consistently to retrieve objects out of cabinets and stand at stove for cooking  tasks using w/c only as needed and countertop support   Time 8   Period Weeks   Status New   OT LONG TERM GOAL #3   Title Pt to perform laundry tasks mod I level in standing   Time 8   Period Weeks   Status New               Plan - 06/18/14 1225    Clinical Impression Statement Pt just beginning to static stand at sink but still requires one hand support within BOS.    Plan Place pt on hold for O.T. until pt can perform dynamic standing and reaching outside BOS for IADLS   Consulted and Agree with Plan of Care Patient;Family member/caregiver        Problem List There are no active problems to display for this patient.   Carey Bullocks, OTR/L 06/18/2014, 12:27 PM  [facility] [address] [place], [place], [address] Phone: [phone]   Fax:  [phone]

## 2014-06-20 ENCOUNTER — Ambulatory Visit: Payer: PRIVATE HEALTH INSURANCE | Admitting: Rehabilitative and Restorative Service Providers"

## 2014-06-20 ENCOUNTER — Encounter: Payer: PRIVATE HEALTH INSURANCE | Admitting: Occupational Therapy

## 2014-06-20 DIAGNOSIS — M6281 Muscle weakness (generalized): Secondary | ICD-10-CM | POA: Diagnosis not present

## 2014-06-20 DIAGNOSIS — Z7409 Other reduced mobility: Secondary | ICD-10-CM

## 2014-06-20 DIAGNOSIS — R5381 Other malaise: Secondary | ICD-10-CM

## 2014-06-20 DIAGNOSIS — R269 Unspecified abnormalities of gait and mobility: Secondary | ICD-10-CM

## 2014-06-20 DIAGNOSIS — S24153D Other incomplete lesion at T7-T10 level of thoracic spinal cord, subsequent encounter: Secondary | ICD-10-CM

## 2014-06-20 DIAGNOSIS — S22068G Other fracture of T7-T8 thoracic vertebra, subsequent encounter for fracture with delayed healing: Secondary | ICD-10-CM

## 2014-06-20 DIAGNOSIS — S32485D Nondisplaced dome fracture of left acetabulum, subsequent encounter for fracture with routine healing: Secondary | ICD-10-CM

## 2014-06-20 NOTE — Therapy (Signed)
[facility] [address] [place], [place], [address] Phone: [phone]   Fax:  [phone]  Physical Therapy Treatment  Patient Details  Name: Richard Bridges MRN: [id number] Date of Birth: 1975-07-06 Referring Provider:  Terese Door, MD  Encounter Date: 06/20/2014      PT End of Session - 06/20/14 1618    Visit Number 6   Number of Visits 16   Date for PT Re-Evaluation 08/04/14   Authorization Type $35 copay, no visit limit   PT Start Time 1230   PT Stop Time 1315   PT Time Calculation (min) 45 min   Activity Tolerance Patient tolerated treatment well   Behavior During Therapy [facility] for tasks assessed/performed      Past Medical History  Diagnosis Date  . Fractures involving multiple body regions 04/14/2014    s/p multi-trauma with pelvic and spinal fractures    Past Surgical History  Procedure Laterality Date  . Spine surgery  04/15/2014    laminectomy s/p multi-trauma  . 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 pt has been compliant with stretching HEP, and his sister reports they purchased a better belt to perform hamstring/calf strech with as well as a back support so he can sit in long sitting position more comfortably.   Patient is accompained by: Family member   Patient Stated Goals Getting back to walking.   Currently in Pain? No/denies        Treatments:  Therapeutic Exercise:  -PROM hamstring/heelcord stretch bilaterally -Sidelying clamshells, 5 reps bilaterally  -Sidelying hip abductions with knees extended -AROM ankle exercises sitting on edge of mat. 5 reps of dorsiflexion/plantarflexion movement bilaterally.      Neuro Re-Ed:  Quadriped/supine activities with peanut ball on mat with CGA/Min assist including: -Tall kneeling emphasizing bearing weight through LEs. Pt used peanut ball to help stabilize with UEs.  -Supine with LEs supported on top of peanut ball, while pt performs bridges. 5 reps.  -Supine with LEs supported on top of peanut ball, pt instructed to flex knees to roll ball forwards and backwards for hamstring/quadriceps strengthening -Lateral and anterior/posterior weight shifts in tall kneeling positon with UEs supported on peanut ball -Quadriped UE reaching alternating x 5 reps -Quadriped hip extension x 5 reps with assist and use of mirror for visual cues on leg position           PT Education - 06/20/14 1616    Education provided Yes   Education Details pt instructed on ankle AROM exercises to improve dorsiflexion ROM at home sitting in rocking chair    Person(s) Educated Patient;Caregiver(s)   Methods Explanation;Demonstration   Comprehension Verbalized understanding          PT Short Term Goals - 06/04/14 2032    PT SHORT TERM GOAL #1   Title The patient will perform HEP with supervision from family for safety.   Baseline Target date 07/04/2014   Time 4  Period Weeks   PT SHORT TERM GOAL #2   Title The patient will move sit<>stand with CGA to RW.   Baseline Target date 07/04/2014   Time 4   Period Weeks   PT SHORT TERM GOAL #3   Title The patient will ambulate with RW and min A x 50 feet.   Baseline Target date 07/04/2014   Time 4   Period Weeks   PT SHORT TERM GOAL #4   Title The patient will transfer w/c<>mat modified indep with boost transfer.   Baseline Target date  07/04/2014   Time 4   Period Weeks   PT SHORT TERM GOAL #5   Title The patient will maintain standing x 5 minutes for improved tolerance to LE weight bearing with contact guard assist.   Baseline Target date 07/04/2014   Time 4   Period Weeks   Additional Short Term Goals   Additional Short Term Goals Yes   PT SHORT TERM GOAL #6   Title The patient will perform car transfers without sliding board with CGA.   Baseline Target date 07/04/2014   Time 4   Period Weeks           PT Long Term Goals - 06/04/14 2037    PT LONG TERM GOAL #1   Title The patient will ambulate with RW x 200 ft with supervision for household ambulation.   Baseline Target date 08/04/2014   Time 8   Period Weeks   PT LONG TERM GOAL #2   Title The patient will perform standing activities x 10 minutes nonstop for return to ADL activities.   Baseline Target date 08/04/2014   Time 8   Period Weeks   PT LONG TERM GOAL #3   Title The patient will transfer sit<>stand to RW with supervsion.   Baseline Target date 08/04/2014   Time 8   Period Weeks   PT LONG TERM GOAL #4   Title The patient will have L LE strength 3/5 and R LE strength 2/5 (for hip flexion, bilat knee flexion/ext).    Baseline Target date 08/04/2014   Time 8   Period Weeks               Plan - 06/20/14 1623    Clinical Impression Statement pt tolerated treatment today well and demonstrated improve LE strength with hip extension activities. pt is making steady progress towards goals.    Pt will benefit from skilled therapeutic intervention in order to improve on the following deficits Abnormal gait;Decreased balance;Decreased mobility;Decreased activity tolerance;Decreased range of motion;Difficulty walking;Impaired flexibility;Impaired sensation;Impaired tone;Increased muscle spasms;Decreased strength;Pain   Rehab Potential Good   PT Frequency 2x / week   PT Duration 8 weeks   PT Treatment/Interventions ADLs/Self Care Home  Management;Electrical Stimulation;Functional mobility training;Therapeutic activities;Patient/family education;Passive range of motion;Wheelchair mobility training;Therapeutic exercise;DME Instruction;Gait training;Balance training;Neuromuscular re-education;Stair training   PT Next Visit Plan aerobic exercise machine to build LE endurance and strength, gait training with RW, transfers from raised mat <> RW   Consulted and Agree with Plan of Care Patient;Family member/caregiver   Family Member Consulted sister        Problem List There are no active problems to display for this patient.  This entire session was performed under direct supervision and direction of a licensed therapist/therapist assistant . I have personally read, edited and approve of the note as written. WEAVER,CHRISTINA, PT   Fanny Dance 06/20/2014, 4:27 PM  [facility] [address]  [place], [place], [address] Phone: [phone]   Fax:  [phone]

## 2014-06-25 ENCOUNTER — Encounter: Payer: PRIVATE HEALTH INSURANCE | Admitting: Occupational Therapy

## 2014-06-25 ENCOUNTER — Ambulatory Visit: Payer: PRIVATE HEALTH INSURANCE | Admitting: Rehabilitative and Restorative Service Providers"

## 2014-06-25 DIAGNOSIS — M6281 Muscle weakness (generalized): Secondary | ICD-10-CM | POA: Diagnosis not present

## 2014-06-25 DIAGNOSIS — Z7409 Other reduced mobility: Secondary | ICD-10-CM

## 2014-06-25 DIAGNOSIS — Z789 Other specified health status: Secondary | ICD-10-CM

## 2014-06-25 DIAGNOSIS — R269 Unspecified abnormalities of gait and mobility: Secondary | ICD-10-CM

## 2014-06-25 DIAGNOSIS — R5381 Other malaise: Secondary | ICD-10-CM

## 2014-06-25 NOTE — Therapy (Signed)
[facility] [address] [place], [place], [address] Phone: [phone]   Fax:  [phone]  Physical Therapy Treatment  Patient Details  Name: Richard Bridges MRN: [id number] Date of Birth: June 08, 1975 Referring Provider:  Terese Door, MD  Encounter Date: 06/25/2014      PT End of Session - 06/25/14 1022    Visit Number 7   Number of Visits 16   Date for PT Re-Evaluation 08/04/14   Authorization Type $35 copay, no visit limit   PT Start Time 0931   PT Stop Time 1018   PT Time Calculation (min) 47 min   Equipment Utilized During Treatment Gait belt;Other (comment)  lace-up ankle stabilizer    Activity Tolerance Patient tolerated treatment well   Behavior During Therapy [facility] for tasks assessed/performed      Past Medical History  Diagnosis Date  . Fractures involving multiple body regions 04/14/2014    s/p multi-trauma with pelvic and spinal fractures    Past Surgical History  Procedure Laterality Date  . Spine surgery  04/15/2014    laminectomy s/p multi-trauma  . 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  Visit Diagnosis:  Generalized muscle weakness  Physical deconditioning  Decreased functional mobility and endurance  Abnormality of gait  Decreased independence with transfers  Impaired mobility and ADLs      Subjective Assessment - 06/25/14 0939    Subjective pt states he has been compliant with HEP, and pt is now standing for one minute at the kitchen counter at home with assist from his sister.    Patient is accompained by: Family member   Patient Stated Goals Getting back to walking.   Currently in Pain? No/denies      Treatment   Therapeutic Activity: -Transfers from raised mat <> RW x 3 with +2 min assist for safety to help with knee control, foot placement, and stability. Pt practiced weight shifting anterior/posterior and laterally in  stance, as well as performing UE movement to emphasize balance and core stability with stance.   -standing ball squeeze between knees to facilitate IR of knees (did not help with ankle positioning today)   Gait Training:  -Pt ambulated approximately 16 feet using RW and +2 min assist for safety/stability with trunk, knee, and ankle control. Pt was able to advance both LEs without assistance. Pt then ambulated another 15 ft with a lace-up ankle stabilizer and demonstrated improved stability with his R ankle.           PT Education - 06/25/14 1021    Education provided Yes   Education Details discussed patient the advantages/disadvantages of KAFOs vs AFOs with knee and ankle stability    Person(s) Educated Patient   Methods Explanation;Demonstration   Comprehension Verbalized understanding          PT Short Term Goals - 06/04/14 2032    PT SHORT TERM GOAL #1   Title The patient will perform HEP with supervision from family for safety.   Baseline Target date 07/04/2014   Time 4   Period Weeks   PT SHORT TERM GOAL #2   Title The patient will move sit<>stand with CGA to RW.   Baseline Target date 07/04/2014   Time 4   Period Weeks   PT SHORT TERM GOAL #3   Title The patient will ambulate with RW and min A x 50 feet.   Baseline Target date  07/04/2014   Time 4   Period Weeks   PT SHORT TERM GOAL #4   Title The patient will transfer w/c<>mat modified indep with boost transfer.   Baseline Target date 07/04/2014   Time 4   Period Weeks   PT SHORT TERM GOAL #5   Title The patient will maintain standing x 5 minutes for improved tolerance to LE weight bearing with contact guard assist.   Baseline Target date 07/04/2014   Time 4   Period Weeks   Additional Short Term Goals   Additional Short Term Goals Yes   PT SHORT TERM GOAL #6   Title The patient will perform car transfers without sliding board with CGA.   Baseline Target date 07/04/2014   Time 4   Period Weeks            PT Long Term Goals - 06/04/14 2037    PT LONG TERM GOAL #1   Title The patient will ambulate with RW x 200 ft with supervision for household ambulation.   Baseline Target date 08/04/2014   Time 8   Period Weeks   PT LONG TERM GOAL #2   Title The patient will perform standing activities x 10 minutes nonstop for return to ADL activities.   Baseline Target date 08/04/2014   Time 8   Period Weeks   PT LONG TERM GOAL #3   Title The patient will transfer sit<>stand to RW with supervsion.   Baseline Target date 08/04/2014   Time 8   Period Weeks   PT LONG TERM GOAL #4   Title The patient will have L LE strength 3/5 and R LE strength 2/5 (for hip flexion, bilat knee flexion/ext).    Baseline Target date 08/04/2014   Time 8   Period Weeks               Plan - 06/25/14 1024    Clinical Impression Statement pt is making progress towards goals, and is demonstrating improvement with ambulating further distances and requiring less assistance during gait. pt should increased ankle stability with lace-up ankle stabilizer/splint with gait.    Pt will benefit from skilled therapeutic intervention in order to improve on the following deficits Abnormal gait;Decreased balance;Decreased mobility;Decreased activity tolerance;Decreased range of motion;Difficulty walking;Impaired flexibility;Impaired sensation;Impaired tone;Increased muscle spasms;Decreased strength;Pain   Rehab Potential Good   PT Frequency 2x / week   PT Duration 8 weeks   PT Treatment/Interventions ADLs/Self Care Home Management;Electrical Stimulation;Functional mobility training;Therapeutic activities;Patient/family education;Passive range of motion;Wheelchair mobility training;Therapeutic exercise;DME Instruction;Gait training;Balance training;Neuromuscular re-education;Stair training   PT Next Visit Plan ankle stability/strengthening; gaiting training w/RW and ace-up ankle stabilizer; endurance on aerobic exercise machine to  increase LE endurance, transfer training from raised mat<>RW, LE strengthening    Consulted and Agree with Plan of Care Patient;Family member/caregiver   Family Member Consulted sister     This entire session was performed under direct supervision and direction of a licensed Chiropractor . I have personally read, edited and approve of the note as written. WEAVER,CHRISTINA, PT    Problem List There are no active problems to display for this patient.   Fanny Dance 06/25/2014, 10:30 AM  [facility] [address] [place], [place], [address] Phone: [phone]   Fax:  [phone]

## 2014-06-27 ENCOUNTER — Encounter: Payer: PRIVATE HEALTH INSURANCE | Admitting: Occupational Therapy

## 2014-06-27 ENCOUNTER — Telehealth: Payer: Self-pay | Admitting: Rehabilitative and Restorative Service Providers"

## 2014-06-27 ENCOUNTER — Ambulatory Visit: Payer: PRIVATE HEALTH INSURANCE | Admitting: Rehabilitative and Restorative Service Providers"

## 2014-06-27 DIAGNOSIS — M6281 Muscle weakness (generalized): Secondary | ICD-10-CM | POA: Diagnosis not present

## 2014-06-27 DIAGNOSIS — R269 Unspecified abnormalities of gait and mobility: Secondary | ICD-10-CM

## 2014-06-27 DIAGNOSIS — Z7409 Other reduced mobility: Secondary | ICD-10-CM

## 2014-06-27 DIAGNOSIS — R5381 Other malaise: Secondary | ICD-10-CM

## 2014-06-27 NOTE — Therapy (Signed)
[facility] [address] [place], [place], [address] Phone: [phone]   Fax:  [phone]  Physical Therapy Treatment  Patient Details  Name: Richard Bridges MRN: [id number] Date of Birth: 04-23-1975 Referring Provider:  Terese Door, MD  Encounter Date: 06/27/2014      PT End of Session - 06/27/14 1029    Visit Number 8   Number of Visits 16   Date for PT Re-Evaluation 08/04/14   Authorization Type $35 copay, no visit limit   PT Start Time 0932   PT Stop Time 1018   PT Time Calculation (min) 46 min   Equipment Utilized During Treatment Gait belt   Activity Tolerance Patient tolerated treatment well   Behavior During Therapy [facility] for tasks assessed/performed      Past Medical History  Diagnosis Date  . Fractures involving multiple body regions 04/14/2014    s/p multi-trauma with pelvic and spinal fractures    Past Surgical History  Procedure Laterality Date  . Spine surgery  04/15/2014    laminectomy s/p multi-trauma  . 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  Visit Diagnosis:  Generalized muscle weakness  Physical deconditioning  Decreased functional mobility and endurance  Abnormality of gait  Decreased independence with transfers  Impaired mobility and ADLs      Subjective Assessment - 06/27/14 0932    Subjective pt reports in the last few days he has been able to extend knees independently so the footplact can be retracted for standing. pt reports he stood at home last night for one minute.    Patient is accompained by: Family member   Patient Stated Goals Getting back to walking.   Currently in Pain? No/denies      Treatment:  Therapeutic Exercise:  -10 reps of bridges with pelvic tilts on mat with cues to control knees in neutral position. Assist required to keep feet stabilized on mat.  -Stool to push posterior and pull anteriorly  with assist working on hamstring and quad control -Seated on edge of mat, pt performed 10 reps of hamstring curls against green theraband resistance bilaterally. Cues for slow, controlled movements.  -Prone knee flexion with min assist on R LE to initially engage hamstrings into ROM movement. 5 reps bilaterally. -Sidelying clamshells x 10 reps bilaterally  -Reverse clamshells x 10 reps bilaterally  Neuro-Reeducation:  -Tall kneeling emphasizing bearing weight through LEs and balance control. Pt used peanut ball to help stabilize with UEs, and then encouraged to alternate flexing UEs.   -R LE PNF emphasizing knee flexion and ankle dorsiflexion ROM to help promote ROM and reduce spasticity symptoms. D1 pattern (flexion/adduction/ER to extension/abduction/IR). 10 reps passive ROM and 10 reps active assist ROM.          PT Education - 06/27/14 1024    Education provided Yes   Education Details reverse clamshells added to pt HEP to focus on hip internal rotators/hip extensors, pt encouraged to try standing for 2 minutes at home to increase endurance   Person(s) Educated Patient;Caregiver(s)   Methods Explanation;Demonstration   Comprehension Verbalized understanding;Returned demonstration          PT Short Term Goals - 06/04/14 2032    PT SHORT TERM GOAL #1   Title The patient will perform HEP with supervision from family for safety.   Baseline Target date 07/04/2014   Time 4   Period Weeks   PT SHORT  TERM GOAL #2   Title The patient will move sit<>stand with CGA to RW.   Baseline Target date 07/04/2014   Time 4   Period Weeks   PT SHORT TERM GOAL #3   Title The patient will ambulate with RW and min A x 50 feet.   Baseline Target date 07/04/2014   Time 4   Period Weeks   PT SHORT TERM GOAL #4   Title The patient will transfer w/c<>mat modified indep with boost transfer.   Baseline Target date 07/04/2014   Time 4   Period Weeks   PT SHORT TERM GOAL #5   Title The patient will  maintain standing x 5 minutes for improved tolerance to LE weight bearing with contact guard assist.   Baseline Target date 07/04/2014   Time 4   Period Weeks   Additional Short Term Goals   Additional Short Term Goals Yes   PT SHORT TERM GOAL #6   Title The patient will perform car transfers without sliding board with CGA.   Baseline Target date 07/04/2014   Time 4   Period Weeks           PT Long Term Goals - 06/04/14 2037    PT LONG TERM GOAL #1   Title The patient will ambulate with RW x 200 ft with supervision for household ambulation.   Baseline Target date 08/04/2014   Time 8   Period Weeks   PT LONG TERM GOAL #2   Title The patient will perform standing activities x 10 minutes nonstop for return to ADL activities.   Baseline Target date 08/04/2014   Time 8   Period Weeks   PT LONG TERM GOAL #3   Title The patient will transfer sit<>stand to RW with supervsion.   Baseline Target date 08/04/2014   Time 8   Period Weeks   PT LONG TERM GOAL #4   Title The patient will have L LE strength 3/5 and R LE strength 2/5 (for hip flexion, bilat knee flexion/ext).    Baseline Target date 08/04/2014   Time 8   Period Weeks               Plan - 06/27/14 1030    Clinical Impression Statement pt tolerated treatment session today well, though was fairly fatiuged towards end. pt is making progress towards goals and is demonstrating increased strength with balance and strengthening acitivites. PNF was perfromed today to help reduce symptoms of R LE spasticity.    Pt will benefit from skilled therapeutic intervention in order to improve on the following deficits Abnormal gait;Decreased balance;Decreased mobility;Decreased activity tolerance;Decreased range of motion;Difficulty walking;Impaired flexibility;Impaired sensation;Impaired tone;Increased muscle spasms;Decreased strength;Pain   Rehab Potential Good   PT Frequency 2x / week   PT Duration 8 weeks   PT Treatment/Interventions  ADLs/Self Care Home Management;Electrical Stimulation;Functional mobility training;Therapeutic activities;Patient/family education;Passive range of motion;Wheelchair mobility training;Therapeutic exercise;DME Instruction;Gait training;Balance training;Neuromuscular re-education;Stair training   PT Next Visit Plan ankle stability/strengthening; gaiting training w/RW and ace-up ankle stabilizer; endurance on aerobic exercise machine to increase LE endurance, transfer training from raised mat<>RW, LE strengthening    Consulted and Agree with Plan of Care Patient;Family member/caregiver   Family Member Consulted sister        Problem List There are no active problems to display for this patient.   Fanny Dance 06/27/2014, 10:34 AM  [facility] [address] [place], [place], [address] Phone: [phone]   Fax:  [phone]      

## 2014-06-27 NOTE — Telephone Encounter (Signed)
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  He has shown significant return of motor function in bilateral LEs and is progressing with ambulation.  He would benefit from:  Orthotics consult for bilateral KAFOs to improve functional ambulation.  *With return of motor function greater in the L LE, I am hoping we can use L AFO and R KAFO (built with option to remove knee componentry as patient progresses).  At this time, his R ankle is rolling into inverted position and he needs physical assist to control R knee and ankle to ambulate.  I feel orthotics will allow for household ambulation to get him weight bearing for functional mobility.  Please sign/return order via fax at [phone] or place order in EPIC. Our phone # is [phone] if you have any questions.   Thanks, Rudell Cobb, [place]

## 2014-07-02 ENCOUNTER — Encounter: Payer: PRIVATE HEALTH INSURANCE | Admitting: Occupational Therapy

## 2014-07-02 ENCOUNTER — Ambulatory Visit: Payer: PRIVATE HEALTH INSURANCE | Admitting: Physical Therapy

## 2014-07-02 DIAGNOSIS — M6281 Muscle weakness (generalized): Secondary | ICD-10-CM

## 2014-07-02 DIAGNOSIS — R5381 Other malaise: Secondary | ICD-10-CM

## 2014-07-02 DIAGNOSIS — Z789 Other specified health status: Secondary | ICD-10-CM

## 2014-07-02 DIAGNOSIS — R269 Unspecified abnormalities of gait and mobility: Secondary | ICD-10-CM

## 2014-07-02 DIAGNOSIS — Z7409 Other reduced mobility: Secondary | ICD-10-CM

## 2014-07-02 NOTE — Therapy (Signed)
[facility] [address] [place], [place], [address] Phone: [phone]   Fax:  [phone]  Physical Therapy Treatment  Patient Details  Name: Richard Bridges MRN: [id number] Date of Birth: 12/07/1975 Referring Provider:  Terese Door, MD  Encounter Date: 07/02/2014      PT End of Session - 07/02/14 1220    Visit Number 9   Number of Visits 16   Date for PT Re-Evaluation 08/04/14   Authorization Type $35 copay, no visit limit   PT Start Time 1016   PT Stop Time 1103   PT Time Calculation (min) 47 min   Equipment Utilized During Treatment Gait belt   Activity Tolerance Patient tolerated treatment well   Behavior During Therapy [facility] for tasks assessed/performed      Past Medical History  Diagnosis Date  . Fractures involving multiple body regions 04/14/2014    s/p multi-trauma with pelvic and spinal fractures    Past Surgical History  Procedure Laterality Date  . Spine surgery  04/15/2014    laminectomy s/p multi-trauma  . 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  Visit Diagnosis:  Generalized muscle weakness  Physical deconditioning  Decreased functional mobility and endurance  Abnormality of gait  Decreased independence with transfers  Impaired mobility and ADLs      Subjective Assessment - 07/02/14 1020    Subjective pt reports he is tolerating sitting in the wheelchair longer with appropriate pressure relief techniques. pt is now standing 1 minute and 45 seconds at home with help from his sister. pt states his HEP is going well at home.    Patient is accompained by: Family member   Patient Stated Goals Getting back to walking.   Currently in Pain? No/denies      Treatment  Gait Training: -amb 28 feet with RW and CGA for safety and stability in indoor level surfaces. Cues for appropriate maneuvering of RW  (pt tends to push RW too far out in  front of him during gait). Cues also provided for anterior weight shifting during stance. Wheel chair pushed behind patient for safety. R ankle stability brace provided. Pt required one seated rest break in [facility] after ambulating 15 feet.  Therapeutic Activity:  -5 sit<>stand transfers from mat<> RW with CGA and cues for appropriate positioning and hand placement. Pt encouraged to place one hand on mat when performing transfers for safety.   Therapeutic Exercise: -Seated hamstring curls with green theraband resistance at ankle. 2 sets of 10 reps bilaterally. Cues for appropriate pace and technique.         PT Short Term Goals - 06/04/14 2032    PT SHORT TERM GOAL #1   Title The patient will perform HEP with supervision from family for safety.   Baseline Target date 07/04/2014   Time 4   Period Weeks   PT SHORT TERM GOAL #2   Title The patient will move sit<>stand with CGA to RW.   Baseline Target date 07/04/2014   Time 4   Period Weeks   PT SHORT TERM GOAL #3   Title The patient will ambulate with RW and min A x 50 feet.   Baseline Target date 07/04/2014   Time 4   Period Weeks   PT SHORT TERM GOAL #4   Title The patient will transfer w/c<>mat modified indep with boost transfer.   Baseline Target date 07/04/2014   Time  4   Period Weeks   PT SHORT TERM GOAL #5   Title The patient will maintain standing x 5 minutes for improved tolerance to LE weight bearing with contact guard assist.   Baseline Target date 07/04/2014   Time 4   Period Weeks   Additional Short Term Goals   Additional Short Term Goals Yes   PT SHORT TERM GOAL #6   Title The patient will perform car transfers without sliding board with CGA.   Baseline Target date 07/04/2014   Time 4   Period Weeks           PT Long Term Goals - 06/04/14 2037    PT LONG TERM GOAL #1   Title The patient will ambulate with RW x 200 ft with supervision for household ambulation.   Baseline Target date 08/04/2014   Time 8   Period  Weeks   PT LONG TERM GOAL #2   Title The patient will perform standing activities x 10 minutes nonstop for return to ADL activities.   Baseline Target date 08/04/2014   Time 8   Period Weeks   PT LONG TERM GOAL #3   Title The patient will transfer sit<>stand to RW with supervsion.   Baseline Target date 08/04/2014   Time 8   Period Weeks   PT LONG TERM GOAL #4   Title The patient will have L LE strength 3/5 and R LE strength 2/5 (for hip flexion, bilat knee flexion/ext).    Baseline Target date 08/04/2014   Time 8   Period Weeks           Plan - 07/02/14 1221    Clinical Impression Statement pt is making steady progress towards goals. pt was able to ambulate 28 feet today with one seated rest break. pt demonstrated improved efficency with sit to stand transfers to RW today.    Pt will benefit from skilled therapeutic intervention in order to improve on the following deficits Abnormal gait;Decreased balance;Decreased mobility;Decreased activity tolerance;Decreased range of motion;Difficulty walking;Impaired flexibility;Impaired sensation;Impaired tone;Increased muscle spasms;Decreased strength;Pain   Rehab Potential Good   PT Frequency 2x / week   PT Duration 8 weeks   PT Treatment/Interventions ADLs/Self Care Home Management;Electrical Stimulation;Functional mobility training;Therapeutic activities;Patient/family education;Passive range of motion;Wheelchair mobility training;Therapeutic exercise;DME Instruction;Gait training;Balance training;Neuromuscular re-education;Stair training   PT Next Visit Plan Check STG's. ankle stability/strengthening; gaiting training w/RW and ace-up ankle stabilizer; endurance on aerobic exercise machine to increase LE endurance, transfer training from raised mat<>RW, LE strengthening   Consulted and Agree with Plan of Care Patient;Family member/caregiver   Family Member Consulted sister      Problem List There are no active problems to display for this  patient.   Fanny Dance 07/03/2014, 12:37 PM  [facility] [address] [place], [place], [address] Phone: [phone]   Fax:  [phone]

## 2014-07-04 ENCOUNTER — Ambulatory Visit: Payer: PRIVATE HEALTH INSURANCE | Admitting: Physical Therapy

## 2014-07-04 ENCOUNTER — Encounter: Payer: PRIVATE HEALTH INSURANCE | Admitting: Occupational Therapy

## 2014-07-04 DIAGNOSIS — R269 Unspecified abnormalities of gait and mobility: Secondary | ICD-10-CM

## 2014-07-04 DIAGNOSIS — M6281 Muscle weakness (generalized): Secondary | ICD-10-CM

## 2014-07-04 DIAGNOSIS — R5381 Other malaise: Secondary | ICD-10-CM

## 2014-07-04 DIAGNOSIS — Z7409 Other reduced mobility: Secondary | ICD-10-CM

## 2014-07-04 NOTE — Therapy (Signed)
[facility] [address] [place], [place], [address] Phone: [phone]   Fax:  [phone]  Physical Therapy Treatment  Patient Details  Name: Richard Bridges MRN: [id number] Date of Birth: 1975/10/17 Referring Provider:  Terese Door, MD  Encounter Date: 07/04/2014      PT End of Session - 07/04/14 1204    Visit Number 10   Number of Visits 16   Date for PT Re-Evaluation 08/04/14   Authorization Type $35 copay, no visit limit   PT Start Time 0847   PT Stop Time 0930   PT Time Calculation (min) 43 min   Equipment Utilized During Treatment Gait belt   Activity Tolerance Patient tolerated treatment well   Behavior During Therapy [facility] for tasks assessed/performed      Past Medical History  Diagnosis Date  . Fractures involving multiple body regions 04/14/2014    s/p multi-trauma with pelvic and spinal fractures    Past Surgical History  Procedure Laterality Date  . Spine surgery  04/15/2014    laminectomy s/p multi-trauma  . 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  Visit Diagnosis:  Generalized muscle weakness  Physical deconditioning  Decreased functional mobility and endurance  Abnormality of gait  Decreased independence with transfers  Impaired mobility and ADLs      Subjective Assessment - 07/04/14 0853    Subjective pt reports he stood yesterday 2 minutes and 15 seconds yesterday. He reports no recent falls or changes.    Patient is accompained by: Family member   Patient Stated Goals Getting back to walking.   Currently in Pain? No/denies       Treatment  Therapeutic Activity: -5 reps sit<>stand from mat to RW with CGA for safety.   Neuromuscular Re-education:  -Weight shifting with reaching to targets in stance position with RW. CGA for safety. 10 reps.  -Tall kneeling with peanut ball for support. Cues given for alternating flexing  UEs and then also maintain balance without UE support.   -Tall kneeling <> sit with peanut ball in front for stability/support. Core strengthening and control emphasized. Cues given to maintain balance for movements. Pillow placed under pts hips during movements for comfort. 10 reps with CGA.  -PNF for R LE in D1 direction, emphasizing R knee flexion/extension movement. IT band stretch incorporated.    Therapeutic Exercise: -Supine hamstring curls with peanut ball. 10 reps.  -Supine bridging on peanut ball for core and LE strengthening. 5 reps of 5 second holds.  -Seated hamstring curls with blue theraband. 10 reps bilaterally. Cues for full knee flexion ROM.  -Seated PROM hamstring/heelcord stretch.          PT Short Term Goals - 06/04/14 2032    PT SHORT TERM GOAL #1   Title The patient will perform HEP with supervision from family for safety.   Baseline Target date 07/04/2014   Time 4   Period Weeks   PT SHORT TERM GOAL #2   Title The patient will move sit<>stand with CGA to RW.   Baseline Target date 07/04/2014   Time 4   Period Weeks   PT SHORT TERM GOAL #3   Title The patient will ambulate with RW and min A x 50 feet.   Baseline Target date 07/04/2014   Time 4   Period Weeks   PT SHORT TERM GOAL #4   Title The patient will transfer w/c<>mat modified  indep with boost transfer.   Baseline Target date 07/04/2014   Time 4   Period Weeks   PT SHORT TERM GOAL #5   Title The patient will maintain standing x 5 minutes for improved tolerance to LE weight bearing with contact guard assist.   Baseline Target date 07/04/2014   Time 4   Period Weeks   Additional Short Term Goals   Additional Short Term Goals Yes   PT SHORT TERM GOAL #6   Title The patient will perform car transfers without sliding board with CGA.   Baseline Target date 07/04/2014   Time 4   Period Weeks           PT Long Term Goals - 06/04/14 2037    PT LONG TERM GOAL #1   Title The patient will ambulate  with RW x 200 ft with supervision for household ambulation.   Baseline Target date 08/04/2014   Time 8   Period Weeks   PT LONG TERM GOAL #2   Title The patient will perform standing activities x 10 minutes nonstop for return to ADL activities.   Baseline Target date 08/04/2014   Time 8   Period Weeks   PT LONG TERM GOAL #3   Title The patient will transfer sit<>stand to RW with supervsion.   Baseline Target date 08/04/2014   Time 8   Period Weeks   PT LONG TERM GOAL #4   Title The patient will have L LE strength 3/5 and R LE strength 2/5 (for hip flexion, bilat knee flexion/ext).    Baseline Target date 08/04/2014   Time 8   Period Weeks            Plan - 07/04/14 1205    Clinical Impression Statement pt challeneged today with tall kneeling exercises on peanut ball, but demonstrated good tolerance to activities. pt continues to demo improved efficency with sit<>stand at RW, only requiring CGA. pt still requires assistance to complete supine heel slide motion on left.    Pt will benefit from skilled therapeutic intervention in order to improve on the following deficits Abnormal gait;Decreased balance;Decreased mobility;Decreased activity tolerance;Decreased range of motion;Difficulty walking;Impaired flexibility;Impaired sensation;Impaired tone;Increased muscle spasms;Decreased strength;Pain   Rehab Potential Good   PT Frequency 2x / week   PT Duration 8 weeks   PT Treatment/Interventions ADLs/Self Care Home Management;Electrical Stimulation;Functional mobility training;Therapeutic activities;Patient/family education;Passive range of motion;Wheelchair mobility training;Therapeutic exercise;DME Instruction;Gait training;Balance training;Neuromuscular re-education;Stair training   PT Next Visit Plan ankle stability/strengthening; gaiting training w/RW and ace-up ankle stabilizer; endurance on aerobic exercise machine to increase LE endurance, transfer training from raised mat<>RW, LE  strengthening    Consulted and Agree with Plan of Care Patient;Family member/caregiver   Family Member Consulted sister        Problem List There are no active problems to display for this patient.   Fanny Dance 07/05/2014, 12:08 PM  [facility] [address] [place], [place], [address] Phone: [phone]   Fax:  [phone]

## 2014-07-11 ENCOUNTER — Ambulatory Visit: Payer: PRIVATE HEALTH INSURANCE | Attending: Psychiatry | Admitting: Physical Therapy

## 2014-07-11 DIAGNOSIS — R5381 Other malaise: Secondary | ICD-10-CM | POA: Insufficient documentation

## 2014-07-11 DIAGNOSIS — M6281 Muscle weakness (generalized): Secondary | ICD-10-CM

## 2014-07-11 DIAGNOSIS — Z7409 Other reduced mobility: Secondary | ICD-10-CM | POA: Diagnosis present

## 2014-07-11 DIAGNOSIS — Z658 Other specified problems related to psychosocial circumstances: Secondary | ICD-10-CM | POA: Insufficient documentation

## 2014-07-11 DIAGNOSIS — R269 Unspecified abnormalities of gait and mobility: Secondary | ICD-10-CM | POA: Insufficient documentation

## 2014-07-11 NOTE — Therapy (Signed)
[facility] [address] [place], [place], [address] Phone: [phone]   Fax:  [phone]  Physical Therapy Treatment  Patient Details  Name: Richard Bridges MRN: [id number] Date of Birth: Nov 17, 1975 Referring Provider:  Terese Door, MD  Encounter Date: 07/11/2014      PT End of Session - 07/11/14 1455    Visit Number 11   Number of Visits 16   Date for PT Re-Evaluation 08/04/14   Authorization Type $35 copay, no visit limit   PT Start Time 1102   PT Stop Time 1145   PT Time Calculation (min) 43 min   Equipment Utilized During Treatment Gait belt   Activity Tolerance Patient tolerated treatment well   Behavior During Therapy [facility] for tasks assessed/performed      Past Medical History  Diagnosis Date  . Fractures involving multiple body regions 04/14/2014    s/p multi-trauma with pelvic and spinal fractures    Past Surgical History  Procedure Laterality Date  . Spine surgery  04/15/2014    laminectomy s/p multi-trauma  . 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  Visit Diagnosis:  Generalized muscle weakness  Physical deconditioning  Decreased functional mobility and endurance  Abnormality of gait  Decreased independence with transfers      Subjective Assessment - 07/11/14 1105    Subjective pt went to MD yesterday and received baclofen prescription which he took his first dose was taken last night.  MD requested no brace/AFO for pt due to presence of spasms. pt's sister reports pt is standing for 2 min and 15 seconds at counter for two reps (second rep is usually just short of this time). No falls to report.     Patient is accompained by: Family member   Patient Stated Goals Getting back to walking.   Currently in Pain? No/denies      Treatment   Therapeutic Exercise: -Sci fit x 5 minutes to promote endurance, reciprocal motor programming, and  LE strengthening. L LE required stabilization with theraband to keep foot in place on pedal. Legs only with no resistance.   Gait Training: -amb 30 feet with RW and CGA for safety and stability in indoor level surfaces. Cues for appropriate step length with the maneuvering of RW. Cues also provided for anterior weight shifting during stance and equal weight bearing through LEs. Wheel chair pushed behind patient for safety. R ankle stability brace provided. Pt required two seated rest breaks in WC when ambulating (one after 5 feet and one after 15 feet).   Therapeutic Activity: -Lateral transfer from [facility] <> sci fit with SBA -Sit/stand transfers from [facility] <> RW with CGA and cues for hand placement, anterior weight shifting, and weightbearing equally through LEs. Pt cued to place R plantar surface of foot fully on the floor in standing. 3 reps.          PT Short Term Goals - 06/04/14 2032    PT SHORT TERM GOAL #1   Title The patient will perform HEP with supervision from family for safety.   Baseline Target date 07/04/2014   Time 4   Period Weeks   PT SHORT TERM GOAL #2   Title The patient will move sit<>stand with CGA to RW.   Baseline Target date 07/04/2014   Time 4   Period Weeks   PT SHORT TERM GOAL #3   Title The patient will ambulate with  RW and min A x 50 feet.   Baseline Target date 07/04/2014   Time 4   Period Weeks   PT SHORT TERM GOAL #4   Title The patient will transfer w/c<>mat modified indep with boost transfer.   Baseline Target date 07/04/2014   Time 4   Period Weeks   PT SHORT TERM GOAL #5   Title The patient will maintain standing x 5 minutes for improved tolerance to LE weight bearing with contact guard assist.   Baseline Target date 07/04/2014   Time 4   Period Weeks   Additional Short Term Goals   Additional Short Term Goals Yes   PT SHORT TERM GOAL #6   Title The patient will perform car transfers without sliding board with CGA.   Baseline Target date 07/04/2014    Time 4   Period Weeks           PT Long Term Goals - 06/04/14 2037    PT LONG TERM GOAL #1   Title The patient will ambulate with RW x 200 ft with supervision for household ambulation.   Baseline Target date 08/04/2014   Time 8   Period Weeks   PT LONG TERM GOAL #2   Title The patient will perform standing activities x 10 minutes nonstop for return to ADL activities.   Baseline Target date 08/04/2014   Time 8   Period Weeks   PT LONG TERM GOAL #3   Title The patient will transfer sit<>stand to RW with supervsion.   Baseline Target date 08/04/2014   Time 8   Period Weeks   PT LONG TERM GOAL #4   Title The patient will have L LE strength 3/5 and R LE strength 2/5 (for hip flexion, bilat knee flexion/ext).    Baseline Target date 08/04/2014   Time 8   Period Weeks           Plan - 07/11/14 1456    Clinical Impression Statement pt reported good response today with Sci fit endurance/strengthening training, stating he could feel his legs working hard during the movements. pt was able to ambulate 30 feet today with two rest breaks (one after 5 feet to adjust ankle stabilizer and another after 10 feet for rest). pt is progressing towards goals.    Pt will benefit from skilled therapeutic intervention in order to improve on the following deficits Abnormal gait;Decreased balance;Decreased mobility;Decreased activity tolerance;Decreased range of motion;Difficulty walking;Impaired flexibility;Impaired sensation;Impaired tone;Increased muscle spasms;Decreased strength;Pain   Rehab Potential Good   PT Frequency 2x / week   PT Duration 8 weeks   PT Treatment/Interventions ADLs/Self Care Home Management;Electrical Stimulation;Functional mobility training;Therapeutic activities;Patient/family education;Passive range of motion;Wheelchair mobility training;Therapeutic exercise;DME Instruction;Gait training;Balance training;Neuromuscular re-education;Stair training   PT Next Visit Plan ankle  stability/strengthening; gaiting training w/RW and ace-up ankle stabilizer; endurance on aerobic exercise machine to increase LE endurance, trunk and LE strengthening    Consulted and Agree with Plan of Care Patient;Family member/caregiver   Family Member Consulted sister        Problem List There are no active problems to display for this patient.   Fanny Dance 07/11/2014, 3:00 PM  [facility] [address] [place], [place], [address] Phone: [phone]   Fax:  [phone]

## 2014-07-12 ENCOUNTER — Ambulatory Visit: Payer: PRIVATE HEALTH INSURANCE | Admitting: Physical Therapy

## 2014-07-12 DIAGNOSIS — R269 Unspecified abnormalities of gait and mobility: Secondary | ICD-10-CM

## 2014-07-12 DIAGNOSIS — R5381 Other malaise: Secondary | ICD-10-CM

## 2014-07-12 DIAGNOSIS — M6281 Muscle weakness (generalized): Secondary | ICD-10-CM

## 2014-07-12 DIAGNOSIS — Z7409 Other reduced mobility: Secondary | ICD-10-CM

## 2014-07-12 NOTE — Therapy (Signed)
[facility] [address] [place], [place], [address] Phone: [phone]   Fax:  [phone]  Physical Therapy Treatment  Patient Details  Name: Richard Bridges MRN: [id number] Date of Birth: [birth year] Referring Provider:  Terese Door, MD  Encounter Date: 07/12/2014      PT End of Session - 07/12/14 1627    Visit Number 12   Number of Visits 16   Date for PT Re-Evaluation 08/04/14   Authorization Type $35 copay, no visit limit   PT Start Time 1531   PT Stop Time 1615   PT Time Calculation (min) 44 min   Equipment Utilized During Treatment Gait belt   Activity Tolerance Patient tolerated treatment well   Behavior During Therapy [facility] for tasks assessed/performed      Past Medical History  Diagnosis Date  . Fractures involving multiple body regions 04/14/2014    s/p multi-trauma with pelvic and spinal fractures    Past Surgical History  Procedure Laterality Date  . Spine surgery  04/15/2014    laminectomy s/p multi-trauma  . 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  Visit Diagnosis:  Generalized muscle weakness  Physical deconditioning  Decreased functional mobility and endurance  Abnormality of gait  Decreased independence with transfers      Subjective Assessment - 07/12/14 1540    Subjective pt reports he has noticed some decline in his spasms since starting his baclofen priscription. pt states he was fatigued after yesterday's treatment but is not sore today.    Patient is accompained by: Family member   Patient Stated Goals Getting back to walking.   Currently in Pain? No/denies       Treatment   Therapeutic Exercise: -Sci fit aerobic exercise machine for 5 minutes and level 1 resistance to build endurance and promote reciprocal gait pattern. Green theraband used around R ankle to secure position on pedal.  -Sidelying on L side: reverse clams  for hip strengthening and to promote pronation movement with ankle (active assist require for ankle motion). 10 reps. -Supine bridges on mat with assistance to maintain ankles in neutral alignment on mat. 10 reps.   Neuromuscular Reeducation:  -supine isotonic reverals PNF to R leg flexion/DF<>extension/PF. 10 reps with cues on resistance and movement. Pt with decreased spasms during and after this. Unable to achieve full PNF pattern planes due to tone.   Balance activities in parallel bars to promote LE weight bearing and static balance: Pt practiced maintaining static balance with CGA as necessary, and then practiced alternating arm flexion movements releasing support from parallel bars and promoting LE weightbearing. 5 reps of alternating movements. Pt was only able to maintain static balance without any UE support for a few seconds.  Pt instructed to maintain stance and toss bean bags into cornhole on other side of parallel bars. Assistance required to place foot flat on the floor and decrease spasms. Cues provided for anterior weight shifting at hips to maintain balance. Pt tossed approximately 10 bean bags before requring rest break (after about 5 minutes of standing). Pt then tossed another 5 bean bags in stance. CGA for safety.          PT Short Term Goals - 06/04/14 2032    PT SHORT TERM GOAL #1   Title The patient will perform HEP with supervision from family for safety.   Baseline Target date 07/04/2014   Time 4  Period Weeks   PT SHORT TERM GOAL #2   Title The patient will move sit<>stand with CGA to RW.   Baseline Target date 07/04/2014   Time 4   Period Weeks   PT SHORT TERM GOAL #3   Title The patient will ambulate with RW and min A x 50 feet.   Baseline Target date 07/04/2014   Time 4   Period Weeks   PT SHORT TERM GOAL #4   Title The patient will transfer w/c<>mat modified indep with boost transfer.   Baseline Target date 07/04/2014   Time 4   Period Weeks   PT SHORT  TERM GOAL #5   Title The patient will maintain standing x 5 minutes for improved tolerance to LE weight bearing with contact guard assist.   Baseline Target date 07/04/2014   Time 4   Period Weeks   Additional Short Term Goals   Additional Short Term Goals Yes   PT SHORT TERM GOAL #6   Title The patient will perform car transfers without sliding board with CGA.   Baseline Target date 07/04/2014   Time 4   Period Weeks           PT Long Term Goals - 06/04/14 2037    PT LONG TERM GOAL #1   Title The patient will ambulate with RW x 200 ft with supervision for household ambulation.   Baseline Target date 08/04/2014   Time 8   Period Weeks   PT LONG TERM GOAL #2   Title The patient will perform standing activities x 10 minutes nonstop for return to ADL activities.   Baseline Target date 08/04/2014   Time 8   Period Weeks   PT LONG TERM GOAL #3   Title The patient will transfer sit<>stand to RW with supervsion.   Baseline Target date 08/04/2014   Time 8   Period Weeks   PT LONG TERM GOAL #4   Title The patient will have L LE strength 3/5 and R LE strength 2/5 (for hip flexion, bilat knee flexion/ext).    Baseline Target date 08/04/2014   Time 8   Period Weeks           Plan - 07/12/14 1629    Clinical Impression Statement patient fatigued after treatment session today, but is continuing to make progress towards goals. Good response to R ankle PNF activation to promote pronation movement. pt stood for approx 15 minutes today at parallel bars (with two rest breaks) for balance activities.    Pt will benefit from skilled therapeutic intervention in order to improve on the following deficits Abnormal gait;Decreased balance;Decreased mobility;Decreased activity tolerance;Decreased range of motion;Difficulty walking;Impaired flexibility;Impaired sensation;Impaired tone;Increased muscle spasms;Decreased strength;Pain   Rehab Potential Good   PT Frequency 2x / week   PT Duration 8 weeks    PT Treatment/Interventions ADLs/Self Care Home Management;Electrical Stimulation;Functional mobility training;Therapeutic activities;Patient/family education;Passive range of motion;Wheelchair mobility training;Therapeutic exercise;DME Instruction;Gait training;Balance training;Neuromuscular re-education;Stair training   PT Next Visit Plan balance training emphasizing LE weight bearing without arm support; ankle stability/strengthening; gaiting training w/RW and ace-up ankle stabilizer; endurance on aerobic exercise machine to increase LE endurance, trunk and LE strengthening    Consulted and Agree with Plan of Care Patient;Family member/caregiver   Family Member Consulted sister        Problem List There are no active problems to display for this patient.   Fanny Dance 07/12/2014, 4:51 PM  [facility] [address]  [place], [place], [address] Phone: [phone]   Fax:  [phone]

## 2014-07-16 ENCOUNTER — Encounter: Payer: Self-pay | Admitting: Physical Therapy

## 2014-07-16 ENCOUNTER — Ambulatory Visit: Payer: PRIVATE HEALTH INSURANCE | Admitting: Physical Therapy

## 2014-07-16 DIAGNOSIS — M6281 Muscle weakness (generalized): Secondary | ICD-10-CM | POA: Diagnosis not present

## 2014-07-16 DIAGNOSIS — Z7409 Other reduced mobility: Secondary | ICD-10-CM

## 2014-07-16 DIAGNOSIS — R5381 Other malaise: Secondary | ICD-10-CM

## 2014-07-18 ENCOUNTER — Ambulatory Visit: Payer: PRIVATE HEALTH INSURANCE | Admitting: Physical Therapy

## 2014-07-18 ENCOUNTER — Telehealth: Payer: Self-pay | Admitting: Rehabilitative and Restorative Service Providers"

## 2014-07-18 DIAGNOSIS — R5381 Other malaise: Secondary | ICD-10-CM

## 2014-07-18 DIAGNOSIS — M6281 Muscle weakness (generalized): Secondary | ICD-10-CM

## 2014-07-18 DIAGNOSIS — R269 Unspecified abnormalities of gait and mobility: Secondary | ICD-10-CM

## 2014-07-18 DIAGNOSIS — Z7409 Other reduced mobility: Secondary | ICD-10-CM

## 2014-07-18 NOTE — Therapy (Signed)
[facility] [address] [place], [place], [address] Phone: [phone]   Fax:  [phone]  Physical Therapy Treatment  Patient Details  Name: Richard Bridges MRN: [id number] Date of Birth: February 02, 1976 Referring Provider:  Terese Door, MD  Encounter Date: 07/18/2014      PT End of Session - 07/18/14 1456    Visit Number 14   Number of Visits 16   Date for PT Re-Evaluation 08/04/14   Authorization Type $35 copay, no visit limit   PT Start Time 1316   PT Stop Time 1401   PT Time Calculation (min) 45 min   Equipment Utilized During Treatment Gait belt   Activity Tolerance Patient tolerated treatment well   Behavior During Therapy [facility] for tasks assessed/performed      Past Medical History  Diagnosis Date  . Fractures involving multiple body regions 04/14/2014    s/p multi-trauma with pelvic and spinal fractures    Past Surgical History  Procedure Laterality Date  . Spine surgery  04/15/2014    laminectomy s/p multi-trauma  . 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  Visit Diagnosis:  Generalized muscle weakness  Physical deconditioning  Decreased functional mobility and endurance  Abnormality of gait  Impaired mobility and ADLs      Subjective Assessment - 07/18/14 1314    Subjective pt reports he is able to flex his right knee in supine position now independently through full ROM. Reports spasms are getting somewhat better. pt is now standing 6 minutes total with one rest break.    Currently in Pain? No/denies      Treatment:  Gait Training: amb 23 feet with RW and CGA for safety and stability in indoor level surfaces. Cues for appropriate step length with the maneuvering of RW. Cues also provided for anterior weight shifting during stance and equal weight bearing through LEs. Wheel chair pushed behind patient for safety. R ankle stability brace  provided. Pt required one seated rest break in [facility] when ambulating (break after 3 feet/turning walker to align on indoor track)  Therapeutic Activity: -Sit/stand transfers from [facility] <> RW with CGA and cues for hand placement, anterior weight shifting, and weightbearing equally through LEs. Pt cued to place R plantar surface of foot fully on the floor in standing. Pt's feet blocked in front for stability when completing transfer.  3 reps.   Therapeutic Exercise: -passive supine heelcord/hamstring stretch. 20 second holds bilaterally x 2.  -Supine marching: 10 reps with cues to place feet flat on mat when lowering LEs during march.  -Supine SLRs. 5 reps bilaterally. Appropriate cues provided for form. Exercise added to pt's HEP.            PT Education - 07/18/14 1454    Education provided Yes   Education Details SLRs added to pt's HEP    Person(s) Educated Patient;Caregiver(s)   Methods Explanation;Demonstration   Comprehension Verbalized understanding;Returned demonstration          PT Short Term Goals - 07/18/14 1321    PT SHORT TERM GOAL #1   Title The patient will perform HEP with supervision from family for safety.   Baseline met   Status Achieved   PT SHORT TERM GOAL #2   Title The patient will move sit<>stand with CGA to RW.  with pt's feet bocked by provider to prevent feet from sliding out. Cues provided.    Baseline  Target date 07/04/2014   Status Achieved   PT SHORT TERM GOAL #3   Title The patient will ambulate with RW and min A x 50 feet.  today pt amb 20 feet with RW and Min A.    Baseline Target date 07/04/2014   Status Partially Met   PT SHORT TERM GOAL #4   Title The patient will transfer w/c<>mat modified indep with boost transfer.   Baseline met   Status Achieved   PT SHORT TERM GOAL #5   Title The patient will maintain standing x 5 minutes for improved tolerance to LE weight bearing with contact guard assist.  pt is now standing 2 x 3 minutes    Baseline  Target date 07/04/2014   Status Partially Met   PT SHORT TERM GOAL #6   Title The patient will perform car transfers without sliding board with CGA.  pt reports this is not a meaningful goal for him to meet due to the layout of the car he uses (too big of gap between w/c and carseat)    Baseline Target date 07/04/2014   Status Deferred           PT Long Term Goals - 06/04/14 2037    PT LONG TERM GOAL #1   Title The patient will ambulate with RW x 200 ft with supervision for household ambulation.   Baseline Target date 08/04/2014   Time 8   Period Weeks   PT LONG TERM GOAL #2   Title The patient will perform standing activities x 10 minutes nonstop for return to ADL activities.   Baseline Target date 08/04/2014   Time 8   Period Weeks   PT LONG TERM GOAL #3   Title The patient will transfer sit<>stand to RW with supervsion.   Baseline Target date 08/04/2014   Time 8   Period Weeks   PT LONG TERM GOAL #4   Title The patient will have L LE strength 3/5 and R LE strength 2/5 (for hip flexion, bilat knee flexion/ext).    Baseline Target date 08/04/2014   Time 8   Period Weeks            Plan - 07/18/14 1527    Clinical Impression Statement pt was somewhat limited today during activities to due bilateral LE spasms. It should also be noted that pt's LLE was swollen today, which he claims happens intermittently and generally resolves on it's own with elevation techniques. pt instructed to perfrom ankle pumps to help minimize swelling. STG's were reviewed today with pt, many of which were met or partially met. pt demo significant improvements in LE strength with therapeutic exercises today. Pt is continuing to progress towards goals.     Pt will benefit from skilled therapeutic intervention in order to improve on the following deficits Abnormal gait;Decreased balance;Decreased mobility;Decreased activity tolerance;Decreased range of motion;Difficulty walking;Impaired flexibility;Impaired  sensation;Impaired tone;Increased muscle spasms;Decreased strength;Pain   Rehab Potential Good   PT Frequency 2x / week   PT Duration 8 weeks   PT Treatment/Interventions ADLs/Self Care Home Management;Electrical Stimulation;Functional mobility training;Therapeutic activities;Patient/family education;Passive range of motion;Wheelchair mobility training;Therapeutic exercise;DME Instruction;Gait training;Balance training;Neuromuscular re-education;Stair training   PT Next Visit Plan Neuromuscular ESTIM to promote ankle control during gait; balance training emphasizing LE weight bearing without arm support; ankle stability/strengthening; gaiting training w/RW and ace-up ankle stabilizer; endurance on aerobic exercise machine to increase LE endurance, trunk and LE strengthening    Consulted and Agree with Plan of Care Patient;Family member/caregiver  Family Member Consulted sister        Problem List There are no active problems to display for this patient. Fanny Dance, Student-PT  [place], Student-PT   Fanny Dance 07/18/2014, 3:34 PM  [facility] [address] [place], [place], [address] Phone: [phone]   Fax:  [phone]

## 2014-07-18 NOTE — Telephone Encounter (Signed)
Attempted to leave phone message for Dr. Bella Kennedy on nurse line re: spasticity management R ankle for improved functional standing.    No answer on this line for >8 minutes.   Therefore, will route via EPIC for response  *Pt may benefit from: 1) R AFO to control ankle during stance phase of gait (custom orthotic so strap or molded inner boot can be used to position ankle in such a way to minimize tone-tone decreases when foot held in subtalar neutral position with weight bearing) 2) Wonder if patient would be a candidate for botox R ankle invertors to decrease ankle inversion in standing  Please submit order in epic for R custom AFO and route to : Rudell Cobb, PT if able or fax to [phone]  If agree.  I can be reached at [phone] for further questions.  We have noticed decreased muscle tone since beginning oral tone meds, however ankle position still limiting standing/weight bearing and gait activities.

## 2014-07-18 NOTE — Therapy (Signed)
[facility] [address] [place], [place], [address] Phone: [phone]   Fax:  [phone]  Physical Therapy Treatment  Patient Details  Name: Richard Bridges MRN: [id number] Date of Birth: [birth year] Referring Provider:  Terese Door, MD  Encounter Date: 07/16/2014     07/16/14 1317  PT Visits / Re-Eval  Visit Number 13  Number of Visits 16  Date for PT Re-Evaluation 08/04/14  Authorization  Authorization Type $35 copay, no visit limit  PT Time Calculation  PT Start Time 7672  PT Stop Time 1400  PT Time Calculation (min) 43 min  PT - End of Session  Equipment Utilized During Treatment Gait belt  Activity Tolerance Patient tolerated treatment well  Behavior During Therapy [facility] for tasks assessed/performed     07/16/14 1317  Symptoms/Limitations  Subjective Pt/sister both feel the spasms are getting better. No falls or pain to report.  Pain Assessment  Currently in Pain? No/denies    Past Medical History  Diagnosis Date  . Fractures involving multiple body regions 04/14/2014    s/p multi-trauma with pelvic and spinal fractures    Past Surgical History  Procedure Laterality Date  . Spine surgery  04/15/2014    laminectomy s/p multi-trauma  . 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  Visit Diagnosis:  Generalized muscle weakness  Physical deconditioning  Decreased functional mobility and endurance   Treatment Supervision for wheelchair to mat table transfer. Min assist with sit<>stand transfers mat<>walker  Standing balance activities: min assist to mod assist for balance with second person to stabilize right ankle - alternating UE raises x 10 each side - bil UE raises x 10 - with dowel rod UE raises and upper trunk rotation x 10 each - ball toss fwd, progressing to around [place] x 3-4 minutes total - bean bag toss fwd to basket with pt  reaching laterally/back to mat to retrieve bags  Standing with walker support Worked on pt self rolling ankle back into neutral with minimal results Attempted multiple positions with a wedge under his right shoe to attempt to hold ankle in neutral without success.   Pt's right ankle tends to roll out to the side with his heel off the ground. Assist needed to correct this and then spasms are more controlled. Pt able to self correct the ankle roll on his left ankle and control spasms.         PT Short Term Goals - 07/18/14 0818    PT SHORT TERM GOAL #1   Title The patient will perform HEP with supervision from family for safety.   Baseline met   Status Achieved   PT SHORT TERM GOAL #2   Title The patient will move sit<>stand with CGA to RW.   Baseline Target date 07/04/2014   Status On-going   PT SHORT TERM GOAL #3   Title The patient will ambulate with RW and min A x 50 feet.   Baseline Target date 07/04/2014   Status On-going   PT SHORT TERM GOAL #4   Title The patient will transfer w/c<>mat modified indep with boost transfer.   Baseline met   Status Achieved   PT SHORT TERM GOAL #5   Title The patient will maintain standing x 5 minutes for improved tolerance to LE weight bearing with contact guard assist.   Baseline Target date 07/04/2014   Status On-going   PT SHORT  TERM GOAL #6   Title The patient will perform car transfers without sliding board with CGA.   Baseline Target date 07/04/2014   Status On-going           PT Long Term Goals - 06/04/14 2037    PT LONG TERM GOAL #1   Title The patient will ambulate with RW x 200 ft with supervision for household ambulation.   Baseline Target date 08/04/2014   Time 8   Period Weeks   PT LONG TERM GOAL #2   Title The patient will perform standing activities x 10 minutes nonstop for return to ADL activities.   Baseline Target date 08/04/2014   Time 8   Period Weeks   PT LONG TERM GOAL #3   Title The patient will transfer  sit<>stand to RW with supervsion.   Baseline Target date 08/04/2014   Time 8   Period Weeks   PT LONG TERM GOAL #4   Title The patient will have L LE strength 3/5 and R LE strength 2/5 (for hip flexion, bilat knee flexion/ext).    Baseline Target date 08/04/2014   Time 8   Period Weeks       07/16/14 1318  Plan  Clinical Impression Statement Pt needing mod to max assist for right ankle stability with standing activities. Able to maintain balance without UE support with activity for short periods before needing min assist. Progressing toward goals.  Pt will benefit from skilled therapeutic intervention in order to improve on the following deficits Abnormal gait;Decreased balance;Decreased mobility;Decreased activity tolerance;Decreased range of motion;Difficulty walking;Impaired flexibility;Impaired sensation;Impaired tone;Increased muscle spasms;Decreased strength;Pain  Rehab Potential Good  PT Frequency 2x / week  PT Duration 8 weeks  PT Treatment/Interventions ADLs/Self Care Home Management;Electrical Stimulation;Functional mobility training;Therapeutic activities;Patient/family education;Passive range of motion;Wheelchair mobility training;Therapeutic exercise;DME Instruction;Gait training;Balance training;Neuromuscular re-education;Stair training  PT Next Visit Plan balance training emphasizing LE weight bearing without arm support; ankle stability/strengthening; gaiting training w/RW and ace-up ankle stabilizer; endurance on aerobic exercise machine to increase LE endurance, trunk and LE strengthening   Consulted and Agree with Plan of Care Patient;Family member/caregiver  Family Member Consulted sister     Problem List There are no active problems to display for this patient.   Willow Ora 07/18/2014, 8:19 AM  Willow Ora, PTA, [facility] [address] [place], [place] [address] [phone] 07/18/2014, 8:20 AM

## 2014-07-23 ENCOUNTER — Ambulatory Visit: Payer: PRIVATE HEALTH INSURANCE | Admitting: Physical Therapy

## 2014-07-23 DIAGNOSIS — R5381 Other malaise: Secondary | ICD-10-CM

## 2014-07-23 DIAGNOSIS — M6281 Muscle weakness (generalized): Secondary | ICD-10-CM | POA: Diagnosis not present

## 2014-07-23 DIAGNOSIS — Z7409 Other reduced mobility: Secondary | ICD-10-CM

## 2014-07-23 DIAGNOSIS — R269 Unspecified abnormalities of gait and mobility: Secondary | ICD-10-CM

## 2014-07-24 NOTE — Therapy (Signed)
[facility] [address] [place], [place], [address] Phone: [phone]   Fax:  [phone]  Physical Therapy Treatment  Patient Details  Name: Richard Bridges MRN: [id number] Date of Birth: 09/06/1975 Referring Provider:  Terese Door, MD  Encounter Date: 07/23/2014      PT End of Session - 07/24/14 1026    Visit Number 15   Number of Visits 16   Date for PT Re-Evaluation 08/04/14   Authorization Type $35 copay, no visit limit   PT Start Time 1102   PT Stop Time 1147   PT Time Calculation (min) 45 min   Equipment Utilized During Treatment Gait belt   Activity Tolerance Patient tolerated treatment well   Behavior During Therapy [facility] for tasks assessed/performed      Past Medical History  Diagnosis Date  . Fractures involving multiple body regions 04/14/2014    s/p multi-trauma with pelvic and spinal fractures    Past Surgical History  Procedure Laterality Date  . Spine surgery  04/15/2014    laminectomy s/p multi-trauma  . 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  Visit Diagnosis:  Generalized muscle weakness  Physical deconditioning  Decreased functional mobility and endurance  Abnormality of gait  Impaired mobility and ADLs      Subjective Assessment - 07/23/14 1108    Subjective pt reports decreased swelling, believes it may be related to sodium intake. pt's caregiver reports he has been standing for 5 minutes at home and has been completing stretches.    Patient is accompained by: Family member   Patient Stated Goals Getting back to walking.   Currently in Pain? No/denies      Treatment   Gait Training -amb 30 feet with RW and CGA/MIN assist with wheelchair pushed behind for safety. Pt wore R AFO during gait training for ankle control/stability. Cues provided for foot placement and posture.   Neuromuscular Re-education:  Static balance  training with RW and CGA/MIN assist. Cues for LE placement in line with hips, ankle positioning into neutral, and overall posture. Pt practiced maintaining balance without UE support.   Therapeutic Exercise:  -Long sitting with low back supported against back of chair, pt performed trunk strengthening exercises with overhead ball press. Cues for pt to "look up at ceiling" when coming into long sitting position   to promote anterior pelvic tilt, scapular retraction, and lower core musculature contraction/control. 20 reps.  -5 reps of SLRs on mat bilaterally   Therapeutic Activity: Mat <> RW and wheel<>RW transfers with cues for head to hips ratio and foot placement.           PT Short Term Goals - 07/18/14 1321    PT SHORT TERM GOAL #1   Title The patient will perform HEP with supervision from family for safety.   Baseline met   Status Achieved   PT SHORT TERM GOAL #2   Title The patient will move sit<>stand with CGA to RW.  with pt's feet bocked by provider to prevent feet from sliding out. Cues provided.    Baseline Target date 07/04/2014   Status Achieved   PT SHORT TERM GOAL #3   Title The patient will ambulate with RW and min A x 50 feet.  today pt amb 20 feet with RW and Min A.    Baseline Target date 07/04/2014   Status Partially Met   PT SHORT TERM GOAL #  4   Title The patient will transfer w/c<>mat modified indep with boost transfer.   Baseline met   Status Achieved   PT SHORT TERM GOAL #5   Title The patient will maintain standing x 5 minutes for improved tolerance to LE weight bearing with contact guard assist.  pt is now standing 2 x 3 minutes    Baseline Target date 07/04/2014   Status Partially Met   PT SHORT TERM GOAL #6   Title The patient will perform car transfers without sliding board with CGA.  pt reports this is not a meaningful goal for him to meet due to the layout of the car he uses (too big of gap between w/c and carseat)    Baseline Target date  07/04/2014   Status Deferred           PT Long Term Goals - 06/04/14 2037    PT LONG TERM GOAL #1   Title The patient will ambulate with RW x 200 ft with supervision for household ambulation.   Baseline Target date 08/04/2014   Time 8   Period Weeks   PT LONG TERM GOAL #2   Title The patient will perform standing activities x 10 minutes nonstop for return to ADL activities.   Baseline Target date 08/04/2014   Time 8   Period Weeks   PT LONG TERM GOAL #3   Title The patient will transfer sit<>stand to RW with supervsion.   Baseline Target date 08/04/2014   Time 8   Period Weeks   PT LONG TERM GOAL #4   Title The patient will have L LE strength 3/5 and R LE strength 2/5 (for hip flexion, bilat knee flexion/ext).    Baseline Target date 08/04/2014   Time 8   Period Weeks            Plan - 07/24/14 1027    Clinical Impression Statement pt tolerated treatment session well today and reported improved efficency and stabilization with ambulation when gait training with R AFO. Decreased spasms with use of R AFO as well. pt continues to be challenged with static standing balance without UE support. Good response with trunk strengthening exercises at end of session today. pt is progressing towards goals.    Pt will benefit from skilled therapeutic intervention in order to improve on the following deficits Abnormal gait;Decreased balance;Decreased mobility;Decreased activity tolerance;Decreased range of motion;Difficulty walking;Impaired flexibility;Impaired sensation;Impaired tone;Increased muscle spasms;Decreased strength;Pain   Rehab Potential Good   PT Frequency 2x / week   PT Duration 8 weeks   PT Treatment/Interventions ADLs/Self Care Home Management;Electrical Stimulation;Functional mobility training;Therapeutic activities;Patient/family education;Passive range of motion;Wheelchair mobility training;Therapeutic exercise;DME Instruction;Gait training;Balance training;Neuromuscular  re-education;Stair training   PT Next Visit Plan Neuromuscular ESTIM to promote ankle control during gait; balance training emphasizing LE weight bearing without arm support; ankle stability/strengthening; gaiting training w/RW and ace-up ankle stabilizer; endurance on aerobic exercise machine to increase LE endurance, trunk and LE strengthening    Consulted and Agree with Plan of Care Patient;Family member/caregiver   Family Member Consulted sister     Fanny Dance, [place]    Problem List There are no active problems to display for this patient.   Fanny Dance 07/24/2014, 10:32 AM  [facility] [address] [place], [place], [address] Phone: [phone]   Fax:  [phone]

## 2014-07-25 ENCOUNTER — Telehealth: Payer: Self-pay | Admitting: Rehabilitative and Restorative Service Providers"

## 2014-07-25 ENCOUNTER — Ambulatory Visit: Payer: PRIVATE HEALTH INSURANCE | Admitting: Physical Therapy

## 2014-07-25 DIAGNOSIS — Z7409 Other reduced mobility: Secondary | ICD-10-CM

## 2014-07-25 DIAGNOSIS — M6281 Muscle weakness (generalized): Secondary | ICD-10-CM

## 2014-07-25 DIAGNOSIS — R269 Unspecified abnormalities of gait and mobility: Secondary | ICD-10-CM

## 2014-07-25 DIAGNOSIS — R5381 Other malaise: Secondary | ICD-10-CM

## 2014-07-25 NOTE — Telephone Encounter (Signed)
Left message on nurse line for Dr. Silvestre Mesi nurse to please return a call re: recommendations for AFOs and tone mgmt.  Requested they review notes faxed on 5/18 and 6/8 re: recommendations. Richard Bridges, PT

## 2014-07-26 NOTE — Therapy (Signed)
[facility] [address] [place], [place], [address] Phone: [phone]   Fax:  [phone]  Physical Therapy Treatment  Patient Details  Name: Richard Bridges MRN: [id number] Date of Birth: [birth year] Referring Provider:  Terese Door, MD  Encounter Date: 07/25/2014      PT End of Session - 07/26/14 0801    Visit Number 16   Number of Visits 16   Date for PT Re-Evaluation 08/04/14   Authorization Type $35 copay, no visit limit   PT Start Time 1104   PT Stop Time 1146   PT Time Calculation (min) 42 min   Equipment Utilized During Treatment Gait belt   Activity Tolerance Patient tolerated treatment well   Behavior During Therapy [facility] for tasks assessed/performed      Past Medical History  Diagnosis Date  . Fractures involving multiple body regions 04/14/2014    s/p multi-trauma with pelvic and spinal fractures    Past Surgical History  Procedure Laterality Date  . Spine surgery  04/15/2014    laminectomy s/p multi-trauma  . 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  Visit Diagnosis:  Generalized muscle weakness  Physical deconditioning  Decreased functional mobility and endurance  Abnormality of gait  Impaired mobility and ADLs  Decreased independence with transfers      Subjective Assessment - 07/25/14 1105    Subjective pt reports no changes since last treatment and that he has been doing his leg strengthening exercises at home.    Patient is accompained by: Family member   Patient Stated Goals Getting back to walking.   Currently in Pain? No/denies      Treatment  Therapeutic Exercise: -Seated hamstring/heelcord stretch on edge of mat with cues for stretch duration and placement of gait belt to promote ankle dorsiflexion. 20 second holds bilaterally x 2 reps. -3-way hip strengthening exercise on mat: prone straight leg hip extensions, side  lying straight leg hip abductions, and supine SLRs. 10 reps in each direction bilaterally. Cues for controlled eccentric lowering of LEs to mat.. -Seated long arc quads with 4 lb weights on ankles bilaterally. 20 reps.    Neuromuscular Re-education: -Quadruped strengthening and coordination exercise: pt instructed to maintain position and alternate extending UEs while maintaining balance. CGA for safety.  -Balance training/motor control training in tall kneeling position with green (medium sized) physioball for stability as needed. Pt practiced maintaining static balance in position on mat with cues to engage hip extensors to maintain upright position. Pt also cued to incorportate UE balance in exercises (shoulder flexion and abduction) while maintaining upright positioning and balance. Pt also practiced advancing hips forward to replicate ambulation movement but had difficulty advancing R LE against friction of mat. Pt was able to maintain static tall kneeling balance without UE for approximately 45 seconds to 1 minute today x 3 reps.         PT Education - 07/26/14 0752    Education provided Yes   Education Details sitting on edge of bed/met hamstring stretch added to HEP. pt instructed on how to complete stretch appropriately.    Person(s) Educated Patient;Caregiver(s)   Methods Explanation;Demonstration   Comprehension Verbalized understanding;Returned demonstration          PT Short Term Goals - 07/18/14 1321    PT SHORT TERM GOAL #1   Title The patient will perform HEP with supervision from family for safety.   Baseline  met   Status Achieved   PT SHORT TERM GOAL #2   Title The patient will move sit<>stand with CGA to RW.  with pt's feet bocked by provider to prevent feet from sliding out. Cues provided.    Baseline Target date 07/04/2014   Status Achieved   PT SHORT TERM GOAL #3   Title The patient will ambulate with RW and min A x 50 feet.  today pt amb 20 feet with RW and Min  A.    Baseline Target date 07/04/2014   Status Partially Met   PT SHORT TERM GOAL #4   Title The patient will transfer w/c<>mat modified indep with boost transfer.   Baseline met   Status Achieved   PT SHORT TERM GOAL #5   Title The patient will maintain standing x 5 minutes for improved tolerance to LE weight bearing with contact guard assist.  pt is now standing 2 x 3 minutes    Baseline Target date 07/04/2014   Status Partially Met   PT SHORT TERM GOAL #6   Title The patient will perform car transfers without sliding board with CGA.  pt reports this is not a meaningful goal for him to meet due to the layout of the car he uses (too big of gap between w/c and carseat)    Baseline Target date 07/04/2014   Status Deferred           PT Long Term Goals - 06/04/14 2037    PT LONG TERM GOAL #1   Title The patient will ambulate with RW x 200 ft with supervision for household ambulation.   Baseline Target date 08/04/2014   Time 8   Period Weeks   PT LONG TERM GOAL #2   Title The patient will perform standing activities x 10 minutes nonstop for return to ADL activities.   Baseline Target date 08/04/2014   Time 8   Period Weeks   PT LONG TERM GOAL #3   Title The patient will transfer sit<>stand to RW with supervsion.   Baseline Target date 08/04/2014   Time 8   Period Weeks   PT LONG TERM GOAL #4   Title The patient will have L LE strength 3/5 and R LE strength 2/5 (for hip flexion, bilat knee flexion/ext).    Baseline Target date 08/04/2014   Time 8   Period Weeks           Plan - 07/26/14 0813    Clinical Impression Statement Today pt demonstrated progress with tall kneeling balance on mat with the ability to maintain balance without UE support for approximately 45 seconds to 1 minute. By end of session pt was fatigued and LE spasms were present. pt is continuing to respond well to PT intervention and is progressing towards goals.    Pt will benefit from skilled therapeutic  intervention in order to improve on the following deficits Abnormal gait;Decreased balance;Decreased mobility;Decreased activity tolerance;Decreased range of motion;Difficulty walking;Impaired flexibility;Impaired sensation;Impaired tone;Increased muscle spasms;Decreased strength;Pain   Rehab Potential Good   PT Frequency 2x / week   PT Duration 8 weeks   PT Treatment/Interventions ADLs/Self Care Home Management;Electrical Stimulation;Functional mobility training;Therapeutic activities;Patient/family education;Passive range of motion;Wheelchair mobility training;Therapeutic exercise;DME Instruction;Gait training;Balance training;Neuromuscular re-education;Stair training   PT Next Visit Plan Neuromuscular ESTIM to promote ankle control during gait; tall kneeling balance and activities; ankle stability/strengthening; gaiting training w/ ground reaction AFO; endurance on aerobic exercise machine to increase LE endurance, trunk and LE strengthening  Consulted and Agree with Plan of Care Patient;Family member/caregiver   Family Member Consulted sister        Problem List There are no active problems to display for this patient.  Fanny Dance, Student-PT   Fanny Dance 07/26/2014, 8:18 AM  [facility] [address] [place], [place], [address] Phone: [phone]   Fax:  [phone]

## 2014-07-30 ENCOUNTER — Encounter: Payer: Self-pay | Admitting: Physical Therapy

## 2014-07-30 ENCOUNTER — Ambulatory Visit: Payer: PRIVATE HEALTH INSURANCE | Admitting: Physical Therapy

## 2014-07-30 DIAGNOSIS — R5381 Other malaise: Secondary | ICD-10-CM

## 2014-07-30 DIAGNOSIS — Z7409 Other reduced mobility: Secondary | ICD-10-CM

## 2014-07-30 DIAGNOSIS — M6281 Muscle weakness (generalized): Secondary | ICD-10-CM | POA: Diagnosis not present

## 2014-07-30 NOTE — Therapy (Signed)
[facility] [address] [place], [place], [address] Phone: [phone]   Fax:  [phone]  Physical Therapy Treatment  Patient Details  Name: Richard Bridges MRN: [id number] Date of Birth: July 10, 1975 Referring Provider:  Terese Door, MD  Encounter Date: 07/30/2014      PT End of Session - 07/30/14 1106    Visit Number 17   Number of Visits 16   Date for PT Re-Evaluation 08/04/14   Authorization Type $35 copay, no visit limit   PT Start Time 1102   PT Stop Time 1145   PT Time Calculation (min) 43 min   Equipment Utilized During Treatment Gait belt   Activity Tolerance Patient tolerated treatment well   Behavior During Therapy [facility] for tasks assessed/performed      Past Medical History  Diagnosis Date  . Fractures involving multiple body regions 04/14/2014    s/p multi-trauma with pelvic and spinal fractures    Past Surgical History  Procedure Laterality Date  . Spine surgery  04/15/2014    laminectomy s/p multi-trauma  . 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  Visit Diagnosis:  Generalized muscle weakness  Physical deconditioning  Decreased functional mobility and endurance      Subjective Assessment - 07/30/14 1106    Subjective No new complaints. No falls or pain to report.   Currently in Pain? No/denies     Treatment Long sitting with inverted chair behind him with pillow/s: - with back supported partial curl up 2 sets of 10 reps without UE assist - with back support Lateral curl ups x 10-12 toward each side with hand targets and cues for increased trunk rotaiton - without back support: 2# weight ball over head press x 10 reps with cues on abdominal bracing and posture - without back support: 2# weight ball with arm extended and held at should level, upper trunk rotation left/right x 10 each way, 2 sets  Quadruped: without p-roll support -  alternating UE raises x 10 each side - fwd/bwd weight shifting with emphasis on elbow extension and scapular depression/stabilization  Quadruped with p-roll support - alternating leg kick outs (hip/knee extension) x 10 reps each side, assist to return to knee flexion due to tone.  Tall kneeling with p-roll in front of pt for support as needed: cues on knee/foot position for base of support and cues/assist for posture and ex form - alternating UE raises x 10 each side with emphasis on full extension and maintaining hip extension - attempted [facility] press with 2# ball, pt with low back pain - partial sit backs to feet and then back up into tall kneeling, 2 sets of 10 reps        PT Short Term Goals - 07/18/14 1321    PT SHORT TERM GOAL #1   Title The patient will perform HEP with supervision from family for safety.   Baseline met   Status Achieved   PT SHORT TERM GOAL #2   Title The patient will move sit<>stand with CGA to RW.  with pt's feet bocked by provider to prevent feet from sliding out. Cues provided.    Baseline Target date 07/04/2014   Status Achieved   PT SHORT TERM GOAL #3   Title The patient will ambulate with RW and min A x 50 feet.  today pt amb 20 feet with RW and Min A.    Baseline Target  date 07/04/2014   Status Partially Met   PT SHORT TERM GOAL #4   Title The patient will transfer w/c<>mat modified indep with boost transfer.   Baseline met   Status Achieved   PT SHORT TERM GOAL #5   Title The patient will maintain standing x 5 minutes for improved tolerance to LE weight bearing with contact guard assist.  pt is now standing 2 x 3 minutes    Baseline Target date 07/04/2014   Status Partially Met   PT SHORT TERM GOAL #6   Title The patient will perform car transfers without sliding board with CGA.  pt reports this is not a meaningful goal for him to meet due to the layout of the car he uses (too big of gap between w/c and carseat)    Baseline Target date 07/04/2014    Status Deferred           PT Long Term Goals - 06/04/14 2037    PT LONG TERM GOAL #1   Title The patient will ambulate with RW x 200 ft with supervision for household ambulation.   Baseline Target date 08/04/2014   Time 8   Period Weeks   PT LONG TERM GOAL #2   Title The patient will perform standing activities x 10 minutes nonstop for return to ADL activities.   Baseline Target date 08/04/2014   Time 8   Period Weeks   PT LONG TERM GOAL #3   Title The patient will transfer sit<>stand to RW with supervsion.   Baseline Target date 08/04/2014   Time 8   Period Weeks   PT LONG TERM GOAL #4   Title The patient will have L LE strength 3/5 and R LE strength 2/5 (for hip flexion, bilat knee flexion/ext).    Baseline Target date 08/04/2014   Time 8   Period Weeks           Plan - 07/30/14 1107    Clinical Impression Statement Pt challenged by core and mat activities today with notable increase in stability in quadruped and tall kneeling positions. Pt making progress toward goals.    Pt will benefit from skilled therapeutic intervention in order to improve on the following deficits Abnormal gait;Decreased balance;Decreased mobility;Decreased activity tolerance;Decreased range of motion;Difficulty walking;Impaired flexibility;Impaired sensation;Impaired tone;Increased muscle spasms;Decreased strength;Pain   Rehab Potential Good   PT Frequency 2x / week   PT Duration 8 weeks   PT Treatment/Interventions ADLs/Self Care Home Management;Electrical Stimulation;Functional mobility training;Therapeutic activities;Patient/family education;Passive range of motion;Wheelchair mobility training;Therapeutic exercise;DME Instruction;Gait training;Balance training;Neuromuscular re-education;Stair training   PT Next Visit Plan assess goals    Consulted and Agree with Plan of Care Patient;Family member/caregiver   Family Member Consulted sister      Problem List There are no active problems to  display for this patient.   Willow Ora 07/30/2014, 12:31 PM  Willow Ora, PTA, [facility] [address], [place], [place] [address] [phone] 07/30/2014, 12:31 PM

## 2014-08-01 ENCOUNTER — Ambulatory Visit: Payer: PRIVATE HEALTH INSURANCE | Admitting: Rehabilitative and Restorative Service Providers"

## 2014-08-01 ENCOUNTER — Telehealth: Payer: Self-pay | Admitting: Rehabilitative and Restorative Service Providers"

## 2014-08-01 DIAGNOSIS — R269 Unspecified abnormalities of gait and mobility: Secondary | ICD-10-CM

## 2014-08-01 DIAGNOSIS — M6281 Muscle weakness (generalized): Secondary | ICD-10-CM | POA: Diagnosis not present

## 2014-08-01 NOTE — Telephone Encounter (Signed)
PT called [facility] to speak with [name] re: order for R ankle foot orthoses.  PT to fax via paper fax to ensure they receive order request.  Completed today @ 934.

## 2014-08-01 NOTE — Therapy (Signed)
[facility] [address] [place], [place], [address] Phone: [phone]   Fax:  [phone]  Physical Therapy Treatment  Patient Details  Name: Richard Bridges MRN: [id number] Date of Birth: 12-Oct-1975 Referring Provider:  Terese Door, MD  Encounter Date: 08/01/2014      PT End of Session - 08/01/14 1413    Visit Number 18   Number of Visits 32   Date for PT Re-Evaluation 09/29/14   Authorization Type $35 copay, no visit limit   PT Start Time 1103   PT Stop Time 1148   PT Time Calculation (min) 45 min   Equipment Utilized During Treatment Gait belt   Activity Tolerance Patient tolerated treatment well   Behavior During Therapy [facility] for tasks assessed/performed      Past Medical History  Diagnosis Date  . Fractures involving multiple body regions 04/14/2014    s/p multi-trauma with pelvic and spinal fractures    Past Surgical History  Procedure Laterality Date  . Spine surgery  04/15/2014    laminectomy s/p multi-trauma  . 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  Visit Diagnosis:  Abnormality of gait  Generalized muscle weakness      Subjective Assessment - 08/01/14 1109    Subjective The patient has occasional tightness in back.     Gait: Ambulation x 25 feet x 2 repetitions with min A with RW with tactile cues for R ankle positioning + use of ace wrap to maintain DF/eversion of ankle + hip joint approximation to decrease muscle spasticity.  THERAPEUTIC EXERCISE: MMT: R hip flexion 3/5, L hip flexion 4/5, R hip abduction 3/5, L hip abduction 4/5, R hip extension 2/5, L hip extension 2+/5, R knee flexion 2/5, L knee flexion 3/5, R knee extension 4/5, L knee extension 5/5 *still need to test ankle strength (anticipate 3/5 due to moving against gravity in seated position) Seated ankle ROM activities and ankle eversion R side Seated tennis ball roll  attempting to move toes around ball into flexion/extension  THERAPEUTIC ACTIVITIES: Sit<>stand and standing static attempting to decrease weight through UEs and working on hip positioning for upright posture. Pt requires CGA for sit<>stand for safety.           PT Short Term Goals - 08/01/14 1413    PT SHORT TERM GOAL #1   Title The patient will perform HEP progression with assist from family as needed.   Baseline Target date 08/30/2014   Time 4   Period Weeks   Status New   PT SHORT TERM GOAL #2   Title The patient will move sit<>stand to RW with close supervision (requires CGA to min A 6/22)   Baseline Target date 08/30/2014   Time 4   Period Weeks   Status New   PT SHORT TERM GOAL #3   Title The patient will ambulate with RW and min A x 75 feet (25 ft on 6/22/).   Baseline Target date 08/30/2014   Status New   PT SHORT TERM GOAL #4   Title The patient will have orthotic needs for ankle control met.   Baseline Target date 08/30/2014   Time 4   Period Weeks   Status New   PT SHORT TERM GOAL #5   Title The patient will maintain standing x 5 minutes for improved tolerance to LE weight bearing with SBA.  pt is now standing 2 x  3 minutes    Baseline Target date 08/30/2014   Time 4   Period Weeks   Status New   PT SHORT TERM GOAL #6   Title --   Baseline --   Status --           PT Long Term Goals - 08/01/14 1413    PT LONG TERM GOAL #1   Title The patient will ambulate with RW x 200 ft with supervision for household ambulation.   Baseline Modified target date to 09/29/2014 (on 6/22, patient ambulates 25 feet with min A of 1 person with RW)   Time 8   Period Weeks   Status Revised   PT LONG TERM GOAL #2   Title The patient will perform standing activities x 10 minutes nonstop for return to ADL activities.   Baseline Modified target date to 09/29/2014 (on 6/22, patient/family report standing x 3-4 minutes 1x/day)   Time 8   Period Weeks   Status Revised   PT LONG  TERM GOAL #3   Title The patient will transfer sit<>stand to RW or countertop modified indep.   Baseline Target date 09/29/2014   Time 8   Period Weeks   Status Revised   PT LONG TERM GOAL #4   Title The patient will have L LE strength 3/5 and R LE strength 2/5 (for hip flexion, bilat knee flexion/ext).    Baseline Target date 08/04/2014- see MMT in notes 08/01/2014   Time 8   Period Weeks   Status Achieved               Plan - 08/01/14 1421    Clinical Impression Statement The patient has partially met LTGs. The patient is most limited in PT by LE muscle spasms hindering tolerance to weight bearing activities.  PT has been working to get AFOs to assist with ankle positioning.  The patient has made significant progress in LE strength, and should be able to use strength gains to increase functional independence as his muscle tone is managed medically (pt started on baclofen and PT recommends to f/u with MD re: continued spasms).     Pt will benefit from skilled therapeutic intervention in order to improve on the following deficits Abnormal gait;Decreased balance;Decreased mobility;Decreased activity tolerance;Decreased range of motion;Difficulty walking;Impaired flexibility;Impaired sensation;Impaired tone;Increased muscle spasms;Decreased strength;Pain   Rehab Potential Good   PT Frequency 2x / week   PT Duration 8 weeks   PT Treatment/Interventions ADLs/Self Care Home Management;Electrical Stimulation;Functional mobility training;Therapeutic activities;Patient/family education;Passive range of motion;Wheelchair mobility training;Therapeutic exercise;DME Instruction;Gait training;Balance training;Neuromuscular re-education;Stair training   PT Next Visit Plan Gait training with ace wrap, core and LE strengthening, ankle isolated motor control and stretching    Consulted and Agree with Plan of Care Patient;Family member/caregiver   Family Member Consulted sister        Problem  List There are no active problems to display for this patient.   [place], [place] 08/02/2014, 9:09 AM  [facility] [address] [place], [place], [address] Phone: [phone]   Fax:  [phone]

## 2014-08-06 ENCOUNTER — Ambulatory Visit: Payer: PRIVATE HEALTH INSURANCE | Admitting: Rehabilitative and Restorative Service Providers"

## 2014-08-06 DIAGNOSIS — M6281 Muscle weakness (generalized): Secondary | ICD-10-CM

## 2014-08-06 DIAGNOSIS — R269 Unspecified abnormalities of gait and mobility: Secondary | ICD-10-CM

## 2014-08-06 NOTE — Therapy (Signed)
[facility] [address] [place], [place], [address] Phone: [phone]   Fax:  [phone]  Physical Therapy Treatment  Patient Details  Name: Richard Bridges MRN: [id number] Date of Birth: 1975/12/16 Referring Provider:  Terese Door, MD  Encounter Date: 08/06/2014      PT End of Session - 08/06/14 1342    Visit Number 19   Number of Visits 32   Date for PT Re-Evaluation 09/29/14   Authorization Type $35 copay, no visit limit   PT Start Time 1235   PT Stop Time 1325   PT Time Calculation (min) 50 min   Equipment Utilized During Treatment Gait belt   Activity Tolerance Patient tolerated treatment well   Behavior During Therapy [facility] for tasks assessed/performed      Past Medical History  Diagnosis Date  . Fractures involving multiple body regions 04/14/2014    s/p multi-trauma with pelvic and spinal fractures    Past Surgical History  Procedure Laterality Date  . Spine surgery  04/15/2014    laminectomy s/p multi-trauma  . 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  Visit Diagnosis:  Abnormality of gait  Generalized muscle weakness      Subjective Assessment - 08/06/14 1340    Subjective Patient standing at home for 5 minutes 2x/day and now is transferring into/out of car and SUV without sliding board bearing weight through LEs.   Patient is accompained by: Family member   Currently in Pain? No/denies     Gait: Ambulation x 10 ft x 2 reps with min A first with ottobock brace with ankle inversion control taking extensive time to donn due to muscle spasms and then with ace wrap.  Patient's muscle spasms are worse today and patient attributes this to already being out running errands and feeling more tired today.  THERAPEUTIC ACTIVITIES: Standing weight bearing activities used for tone reduction to attempt to donn AFO.  Tried standing hip approximation and  PT weight bearing through patient's hips to achieve decreased muscle tone.  SELF CARE/HOME MANAGEMENT: Discussed bracing options R AFO, L AFO need, L orthotic needs and consulted with Malena Edman from [facility] [facility] [facility].  He recommends a lace up AFO for ankle stability control and will see if they have one to trial and drop off to PT.  PT and patient discussed limitations with AFO due to muscle spasms and ankle positions.  Also discussed L ankle and possible tone reducing shoe modifications.   PT to attempt to get AFO prescription as it is not currently returned [facility] at this time.         PT Short Term Goals - 08/06/14 1344    PT SHORT TERM GOAL #1   Title The patient will perform HEP progression with assist from family as needed.   Baseline Target date 08/30/2014   Time 4   Period Weeks   Status On-going   PT SHORT TERM GOAL #2   Title The patient will move sit<>stand to RW with close supervision (requires CGA to min A 6/22)   Baseline Target date 08/30/2014   Time 4   Period Weeks   Status On-going   PT SHORT TERM GOAL #3   Title The patient will ambulate with RW and min A x 75 feet (25 ft on 6/22/).   Baseline Target date 08/30/2014   Status On-going   PT SHORT TERM GOAL #  4   Title The patient will have orthotic needs for ankle control met.   Baseline Target date 08/30/2014   Time 4   Period Weeks   Status On-going   PT SHORT TERM GOAL #5   Title The patient will maintain standing x 5 minutes for improved tolerance to LE weight bearing with SBA.  pt is now standing 2 x 3 minutes    Baseline Met on 08/06/2014 with patient increasing standing time when therapist encouraged > standing time   Time 4   Period Weeks   Status Achieved           PT Long Term Goals - 08/01/14 1413    PT LONG TERM GOAL #1   Title The patient will ambulate with RW x 200 ft with supervision for household ambulation.   Baseline Modified target date to 09/29/2014 (on 6/22,  patient ambulates 25 feet with min A of 1 person with RW)   Time 8   Period Weeks   Status Revised   PT LONG TERM GOAL #2   Title The patient will perform standing activities x 10 minutes nonstop for return to ADL activities.   Baseline Modified target date to 09/29/2014 (on 6/22, patient/family report standing x 3-4 minutes 1x/day)   Time 8   Period Weeks   Status Revised   PT LONG TERM GOAL #3   Title The patient will transfer sit<>stand to RW or countertop modified indep.   Baseline Target date 09/29/2014   Time 8   Period Weeks   Status Revised   PT LONG TERM GOAL #4   Title The patient will have L LE strength 3/5 and R LE strength 2/5 (for hip flexion, bilat knee flexion/ext).    Baseline Target date 08/04/2014- see MMT in notes 08/01/2014   Time 8   Period Weeks   Status Achieved               Plan - 08/06/14 1345    Clinical Impression Statement The patient met STG for standing x 5 minutes and increased frequency performing a couple of times each day.  Patient still most limited by muscle spasms and tone.  At today's session discussed lace up AFO for proprioception and tone mgmt to improve tolerance to weight bearing through LEs.   PT Next Visit Plan Core and LE strengthening, isolated ankle motor control and stretching.   Consulted and Agree with Plan of Care Patient        Problem List There are no active problems to display for this patient.   [place], [place] 08/06/2014, 1:47 PM  [facility] [address] [place], [place], [address] Phone: [phone]   Fax:  [phone]

## 2014-08-08 ENCOUNTER — Ambulatory Visit: Payer: PRIVATE HEALTH INSURANCE | Admitting: Rehabilitative and Restorative Service Providers"

## 2014-08-08 DIAGNOSIS — M6281 Muscle weakness (generalized): Secondary | ICD-10-CM | POA: Diagnosis not present

## 2014-08-08 DIAGNOSIS — R269 Unspecified abnormalities of gait and mobility: Secondary | ICD-10-CM

## 2014-08-08 NOTE — Therapy (Signed)
[facility] [address] [place], [place], [address] Phone: [phone]   Fax:  [phone]  Physical Therapy Treatment  Patient Details  Name: Richard Bridges MRN: [id number] Date of Birth: [birth year] Referring Provider:  Terese Door, MD  Encounter Date: 08/08/2014      PT End of Session - 08/08/14 1413    Visit Number 20   Number of Visits 32   Date for PT Re-Evaluation 09/29/14   Authorization Type $35 copay, no visit limit   PT Start Time 1015   PT Stop Time 1110   PT Time Calculation (min) 55 min   Equipment Utilized During Treatment Gait belt   Activity Tolerance Patient tolerated treatment well   Behavior During Therapy [facility] for tasks assessed/performed      Past Medical History  Diagnosis Date  . Fractures involving multiple body regions 04/14/2014    s/p multi-trauma with pelvic and spinal fractures    Past Surgical History  Procedure Laterality Date  . Spine surgery  04/15/2014    laminectomy s/p multi-trauma  . 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  Visit Diagnosis:  Abnormality of gait  Generalized muscle weakness      Subjective Assessment - 08/08/14 1108    Subjective Patient pulled a muscle in L groin last night after transferring out of the car.  He reports pain has improved today.   Patient is accompained by: Family member   Patient Stated Goals Getting back to walking.   Currently in Pain? No/denies     THERAPEUTIC EXERCISE: Prone on elbows and knees plank position for core x 5 reps Heel sitting for toe stretch and quad stretch P/ROM supine hip flexion, knee flexion/extension, ankle DF Quadriped hip extension R and L x 5 reps each, R hip hike/L hip hike x 5 reps each, R knee to L shoulder x 5 reps and L knee to R shoulder x 5 reps Diagonal resisted D1/ D2 patterns in sidelying for LE positioning emphasizing ankle DF and  PF  Gait: Ambulation with RW and min A with R toe off brace, R air cast with cues on R knee flexion to initiate swing phase of gait x 50 ft x 2 with chair following behind patient for safety Standing/ pre gait working on heels down on floor and increasing weight shifting through feet       PT Short Term Goals - 08/08/14 1418    PT SHORT TERM GOAL #1   Title The patient will perform HEP progression with assist from family as needed.   Baseline Target date 08/30/2014   Time 4   Period Weeks   Status On-going   PT SHORT TERM GOAL #2   Title The patient will move sit<>stand to RW with close supervision (requires CGA to min A 6/22)   Baseline Target date 08/30/2014   Time 4   Period Weeks   Status On-going   PT SHORT TERM GOAL #3   Title The patient will ambulate with RW and min A x 75 feet (25 ft on 6/22/).   Baseline Target date 08/30/2014   Status On-going   PT SHORT TERM GOAL #4   Title The patient will have orthotic needs for ankle control met.   Baseline Target date 08/30/2014   Time 4   Period Weeks   Status On-going   PT SHORT TERM GOAL #5   Title The  patient will maintain standing x 5 minutes for improved tolerance to LE weight bearing with SBA.  pt is now standing 2 x 3 minutes    Baseline Met on 08/06/2014 with patient increasing standing time when therapist encouraged > standing time   Time 4   Period Weeks   Status Achieved           PT Long Term Goals - 08/01/14 1413    PT LONG TERM GOAL #1   Title The patient will ambulate with RW x 200 ft with supervision for household ambulation.   Baseline Modified target date to 09/29/2014 (on 6/22, patient ambulates 25 feet with min A of 1 person with RW)   Time 8   Period Weeks   Status Revised   PT LONG TERM GOAL #2   Title The patient will perform standing activities x 10 minutes nonstop for return to ADL activities.   Baseline Modified target date to 09/29/2014 (on 6/22, patient/family report standing x 3-4 minutes  1x/day)   Time 8   Period Weeks   Status Revised   PT LONG TERM GOAL #3   Title The patient will transfer sit<>stand to RW or countertop modified indep.   Baseline Target date 09/29/2014   Time 8   Period Weeks   Status Revised   PT LONG TERM GOAL #4   Title The patient will have L LE strength 3/5 and R LE strength 2/5 (for hip flexion, bilat knee flexion/ext).    Baseline Target date 08/04/2014- see MMT in notes 08/01/2014   Time 8   Period Weeks   Status Achieved               Plan - 08/08/14 1423    Clinical Impression Statement The patient had good response today to air cast R ankle and R toe off brace with new balance shoes. PT called orthotist and recommended we pursue this set-up vs. fabricated AFO.  Patient doubled walking distance today when R ankle supported with orthotics.   PT Next Visit Plan Core and LE strengthening, isolated ankle motor control and stretching. Gait training with R air cast and toe off brace.   Consulted and Agree with Plan of Care Patient        Problem List There are no active problems to display for this patient.   [place], [place] 08/08/2014, 2:25 PM  [facility] [address] [place], [place], [address] Phone: [phone]   Fax:  [phone]

## 2014-08-15 ENCOUNTER — Encounter (HOSPITAL_COMMUNITY): Payer: Self-pay | Admitting: Emergency Medicine

## 2014-08-15 ENCOUNTER — Emergency Department (HOSPITAL_COMMUNITY)
Admission: EM | Admit: 2014-08-15 | Discharge: 2014-08-15 | Disposition: A | Discharge status: Home / Self Care | Payer: PRIVATE HEALTH INSURANCE | Attending: Emergency Medicine | Admitting: Emergency Medicine

## 2014-08-15 ENCOUNTER — Ambulatory Visit: Payer: PRIVATE HEALTH INSURANCE | Admitting: Rehabilitative and Restorative Service Providers"

## 2014-08-15 ENCOUNTER — Emergency Department (HOSPITAL_COMMUNITY): Payer: PRIVATE HEALTH INSURANCE

## 2014-08-15 DIAGNOSIS — Z79899 Other long term (current) drug therapy: Secondary | ICD-10-CM | POA: Insufficient documentation

## 2014-08-15 DIAGNOSIS — N508 Other specified disorders of male genital organs: Secondary | ICD-10-CM | POA: Diagnosis present

## 2014-08-15 DIAGNOSIS — Z7982 Long term (current) use of aspirin: Secondary | ICD-10-CM | POA: Insufficient documentation

## 2014-08-15 DIAGNOSIS — Z87891 Personal history of nicotine dependence: Secondary | ICD-10-CM | POA: Diagnosis not present

## 2014-08-15 DIAGNOSIS — N5089 Other specified disorders of the male genital organs: Secondary | ICD-10-CM

## 2014-08-15 DIAGNOSIS — Z8781 Personal history of (healed) traumatic fracture: Secondary | ICD-10-CM | POA: Diagnosis not present

## 2014-08-15 DIAGNOSIS — Z9889 Other specified postprocedural states: Secondary | ICD-10-CM | POA: Insufficient documentation

## 2014-08-15 DIAGNOSIS — N453 Epididymo-orchitis: Secondary | ICD-10-CM | POA: Insufficient documentation

## 2014-08-15 LAB — CBC WITH DIFFERENTIAL/PLATELET
Basophils Absolute: 0 10*3/uL (ref 0.0–0.1)
Basophils Relative: 0 % (ref 0–1)
Eosinophils Absolute: 0 10*3/uL (ref 0.0–0.7)
Eosinophils Relative: 0 % (ref 0–5)
HCT: 43.3 % (ref 39.0–52.0)
Hemoglobin: 14.1 g/dL (ref 13.0–17.0)
Lymphocytes Relative: 8 % — ABNORMAL LOW (ref 12–46)
Lymphs Abs: 2.1 10*3/uL (ref 0.7–4.0)
MCH: 26 pg (ref 26.0–34.0)
MCHC: 32.6 g/dL (ref 30.0–36.0)
MCV: 79.7 fL (ref 78.0–100.0)
Monocytes Absolute: 2.1 10*3/uL — ABNORMAL HIGH (ref 0.1–1.0)
Monocytes Relative: 9 % (ref 3–12)
Neutro Abs: 20.3 10*3/uL — ABNORMAL HIGH (ref 1.7–7.7)
Neutrophils Relative %: 83 % — ABNORMAL HIGH (ref 43–77)
Platelets: 548 10*3/uL — ABNORMAL HIGH (ref 150–400)
RBC: 5.43 MIL/uL (ref 4.22–5.81)
RDW: 16 % — ABNORMAL HIGH (ref 11.5–15.5)
WBC: 24.5 10*3/uL — ABNORMAL HIGH (ref 4.0–10.5)

## 2014-08-15 LAB — URINALYSIS, ROUTINE W REFLEX MICROSCOPIC
Bilirubin Urine: NEGATIVE
Glucose, UA: NEGATIVE mg/dL
Ketones, ur: 15 mg/dL — AB
Nitrite: NEGATIVE
Protein, ur: NEGATIVE mg/dL
Specific Gravity, Urine: 1.017 (ref 1.005–1.030)
Urobilinogen, UA: 0.2 mg/dL (ref 0.0–1.0)
pH: 7 (ref 5.0–8.0)

## 2014-08-15 LAB — BASIC METABOLIC PANEL
Anion gap: 12 (ref 5–15)
BUN: 11 mg/dL (ref 6–20)
CO2: 23 mmol/L (ref 22–32)
Calcium: 9 mg/dL (ref 8.9–10.3)
Chloride: 101 mmol/L (ref 101–111)
Creatinine, Ser: 0.94 mg/dL (ref 0.61–1.24)
GFR calc Af Amer: >60 mL/min (ref >60)
GFR calc non Af Amer: >60 mL/min (ref >60)
Glucose, Bld: 106 mg/dL — ABNORMAL HIGH (ref 65–99)
Potassium: 4.1 mmol/L (ref 3.5–5.1)
Sodium: 136 mmol/L (ref 135–145)

## 2014-08-15 LAB — URINE MICROSCOPIC-ADD ON

## 2014-08-15 MED ORDER — LEVOFLOXACIN 750 MG PO TABS: 750.0000 mg | ORAL_TABLET | Freq: Every day | ORAL | Status: DC

## 2014-08-15 MED ORDER — HYDROMORPHONE HCL 1 MG/ML IJ SOLN
1.0000 mg | Freq: Once | INTRAMUSCULAR | Status: AC
Start: 2014-08-15 — End: 2014-08-15
  Administered 2014-08-15: 1 mg via INTRAVENOUS
  Filled 2014-08-15: qty 1

## 2014-08-15 MED ORDER — SODIUM CHLORIDE 0.9 % IV BOLUS (SEPSIS)
1000.0000 mL | Freq: Once | INTRAVENOUS | Status: AC
  Administered 2014-08-15: 1000 mL via INTRAVENOUS

## 2014-08-15 MED ORDER — OXYCODONE-ACETAMINOPHEN 5-325 MG PO TABS
1.0000 | ORAL_TABLET | ORAL | Status: DC | PRN
Start: 2014-08-15 — End: 2015-03-13

## 2014-08-15 MED ORDER — LEVOFLOXACIN IN D5W 750 MG/150ML IV SOLN
750.0000 mg | Freq: Once | INTRAVENOUS | Status: AC
  Administered 2014-08-15: 750 mg via INTRAVENOUS
  Filled 2014-08-15: qty 150

## 2014-08-15 NOTE — Discharge Instructions (Signed)
Return here as needed.  Follow-up with the urologist provided. 

## 2014-08-15 NOTE — ED Notes (Signed)
Chris PA at bedside   

## 2014-08-15 NOTE — ED Notes (Signed)
Family reports that pt had UTI symptoms since last Tuesday. Pt seen at Urgent care and given medication for UTI. This morning pt noticed that right testicle is swollen.

## 2014-08-15 NOTE — ED Notes (Signed)
Pt left for US

## 2014-08-15 NOTE — ED Provider Notes (Signed)
CSN: [id number]     Arrival date & time 08/15/14  2505 History   First MD Initiated Contact with Patient 08/15/14 [phone]     Chief Complaint  Patient presents with  . Urinary Tract Infection  . Groin Swelling     (Consider location/radiation/quality/duration/timing/severity/associated sxs/prior Treatment) HPI Patient presents to the emergency department with swelling and pain to the right scrotal area and testicle.  The patient states that he has had swelling, pain in the right testicle over the last 2 days.  Patient states that he was treated for UTI after starting with symptoms on Tuesday.  The patient was seen in urgent care and given medications for this.  Patient states that he noticed the main part of the swelling to the testicle and scrotum this morning, but did notice some pain Previously.  Patient denies chest pain, shortness of breath, bloody stool, hematemesis, dysuria, hematuria, cough, abdominal pain, nausea, vomiting, diarrhea, headache, blurred vision, cough, or syncope.  The patient states that palpation makes the pain worse Past Medical History  Diagnosis Date  . Fractures involving multiple body regions 04/14/2014    s/p multi-trauma with pelvic and spinal fractures   Past Surgical History  Procedure Laterality Date  . Spine surgery  04/15/2014    laminectomy s/p multi-trauma  . 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 History  Substance Use Topics  . Smoking status: Former Research scientist (life sciences)  . Smokeless tobacco: Not on file  . Alcohol Use: Yes    Review of Systems  All other systems negative except as documented in the HPI. All pertinent positives and negatives as reviewed in the HPI.  Allergies  Review of patient's allergies indicates no known allergies.  Home Medications   Prior to Admission medications   Medication Sig Start Date End Date Taking? Authorizing Provider  acetaminophen (TYLENOL) 500 MG tablet Take 1,000 mg by mouth every 6  (six) hours as needed for fever.   Yes Historical Provider, MD  baclofen (LIORESAL) 10 MG tablet Take 10 mg by mouth 3 (three) times daily.   Yes Historical Provider, MD  CALCIUM-VITAMIN D PO Take 1 tablet by mouth daily.   Yes Historical Provider, MD  ciprofloxacin (CIPRO) 500 MG tablet Take 500 mg by mouth 2 (two) times daily. 7 day course started on 08/14/14   Yes Historical Provider, MD  oxyCODONE-acetaminophen (PERCOCET) 10-325 MG per tablet Take 1 tablet by mouth every 4 (four) hours as needed for pain. Dose unknown at evaluation   Yes Historical Provider, MD  aspirin 81 MG tablet Take 81 mg by mouth daily.    Historical Provider, MD   BP 127/77 mmHg  Pulse 79  Temp(Src) 98.4 F (36.9 C) (Oral)  Resp 16  Ht 5\' 4"  (1.626 m)  Wt 130 lb (58.968 kg)  BMI 22.30 kg/m2  SpO2 95% Physical Exam  Constitutional: He is oriented to person, place, and time. He appears well-developed and well-nourished. No distress.  HENT:  Head: Normocephalic and atraumatic.  Mouth/Throat: Oropharynx is clear and moist.  Eyes: Pupils are equal, round, and reactive to light.  Neck: Normal range of motion. Neck supple. No thyromegaly present.  Cardiovascular: Normal rate, regular rhythm and normal heart sounds.  Exam reveals no gallop and no friction rub.   No murmur heard. Pulmonary/Chest: Effort normal and breath sounds normal. No respiratory distress.  Abdominal: Hernia confirmed negative in the right inguinal area and confirmed negative in the left inguinal area.  Genitourinary: Penis normal.    Right testis shows swelling and tenderness.  Neurological: He is alert and oriented to person, place, and time. He exhibits normal muscle tone. Coordination normal.  Skin: Skin is warm and dry. No rash noted. No erythema.  Psychiatric: He has a normal mood and affect. His behavior is normal.  Nursing note and vitals reviewed.   ED Course  Procedures (including critical care time) Labs Review Labs Reviewed    BASIC METABOLIC PANEL - Abnormal; Notable for the following:    Glucose, Bld 106 (*)    All other components within normal limits  CBC WITH DIFFERENTIAL/PLATELET - Abnormal; Notable for the following:    WBC 24.5 (*)    RDW 16.0 (*)    Platelets 548 (*)    Neutrophils Relative % 83 (*)    Neutro Abs 20.3 (*)    Lymphocytes Relative 8 (*)    Monocytes Absolute 2.1 (*)    All other components within normal limits  URINALYSIS, ROUTINE W REFLEX MICROSCOPIC (NOT AT [facility]) - Abnormal; Notable for the following:    Hgb urine dipstick TRACE (*)    Ketones, ur 15 (*)    Leukocytes, UA SMALL (*)    All other components within normal limits  URINE CULTURE  URINE MICROSCOPIC-ADD ON    Imaging Review US Scrotum  08/15/2014   CLINICAL DATA:  39 year old male with scrotal swelling on the right for 1 day. Initial encounter.  The patient self caths his bladder.  Personal history of UTI.  EXAM: SCROTAL ULTRASOUND  DOPPLER ULTRASOUND OF THE TESTICLES  TECHNIQUE: Complete ultrasound examination of the testicles, epididymis, and other scrotal structures was performed. Color and spectral Doppler ultrasound were also utilized to evaluate blood flow to the testicles.  COMPARISON:  None.  FINDINGS: Right testicle  Measurements: 3.8 x 1.9 x 2.2 cm. 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 No mass or microlithiasis visualized.  Left testicle  Measurements: 3.6 x 1.4 x 1.8 cm. No mass or microlithiasis visualized.  Right epididymis:  Heterogeneous and hypervascular (images 26-28).  Left epididymis:  Normal in size and appearance.  Hydrocele:  Small simple appearing right hydrocele.  Varicocele:  None visualized.  Pulsed Doppler interrogation of both testes demonstrates low resistance arterial and venous waveforms bilaterally. The right testis appears moderately hypervascular compared to the left.  IMPRESSION: 1. Acute right epididymo-orchitis: Heterogeneous enlargement and  hypervascularity of the right epididymis. Mild to moderate hypervascularity of the right testis. 2. Small simple appearing right hydrocele.   Electronically Signed   By: Genevie Ann M.D.   On: 08/15/2014 11:46   [place] Art/ven Flow Abd Pelv Doppler  08/15/2014   CLINICAL DATA:  39 year old male with scrotal swelling on the right for 1 day. Initial encounter.  The patient self caths his bladder.  Personal history of UTI.  EXAM: SCROTAL ULTRASOUND  DOPPLER ULTRASOUND OF THE TESTICLES  TECHNIQUE: Complete ultrasound examination of the testicles, epididymis, and other scrotal structures was performed. Color and spectral Doppler ultrasound were also utilized to evaluate blood flow to the testicles.  COMPARISON:  None.  FINDINGS: Right testicle  Measurements: 3.8 x 1.9 x 2.2 cm. 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 No mass or microlithiasis visualized.  Left testicle  Measurements: 3.6 x 1.4 x 1.8 cm. No mass or microlithiasis visualized.  Right epididymis:  Heterogeneous and hypervascular (images 26-28).  Left epididymis:  Normal in size and appearance.  Hydrocele:  Small simple appearing right hydrocele.  Varicocele:  None visualized.  Pulsed Doppler interrogation of both testes demonstrates low resistance arterial and venous waveforms bilaterally. The right testis appears moderately hypervascular compared to the left.  IMPRESSION: 1. Acute right epididymo-orchitis: Heterogeneous enlargement and hypervascularity of the right epididymis. Mild to moderate hypervascularity of the right testis. 2. Small simple appearing right hydrocele.   Electronically Signed   By: Genevie Ann M.D.   On: 08/15/2014 11:46   Patient be treated for orchitis and epididymitis.  He will be referred to urology for recheck.  There is no signs of Fournier gangrene at this time.  Patient is advised return here as needed.  He is given IV Levaquin, states that he is not currently sexually active that is why  Levaquin was chosen  [employer], PA-C 08/15/14 1452  Pattricia Boss, MD 08/15/14 [phone]

## 2014-08-16 LAB — URINE CULTURE: Culture: NO GROWTH

## 2014-08-17 ENCOUNTER — Ambulatory Visit: Payer: PRIVATE HEALTH INSURANCE | Admitting: Rehabilitative and Restorative Service Providers"

## 2014-08-20 ENCOUNTER — Ambulatory Visit: Payer: PRIVATE HEALTH INSURANCE | Attending: Psychiatry | Admitting: Rehabilitative and Restorative Service Providers"

## 2014-08-20 ENCOUNTER — Telehealth: Payer: Self-pay | Admitting: Rehabilitative and Restorative Service Providers"

## 2014-08-20 DIAGNOSIS — R269 Unspecified abnormalities of gait and mobility: Secondary | ICD-10-CM | POA: Diagnosis present

## 2014-08-20 DIAGNOSIS — Z7409 Other reduced mobility: Secondary | ICD-10-CM | POA: Insufficient documentation

## 2014-08-20 DIAGNOSIS — M6281 Muscle weakness (generalized): Secondary | ICD-10-CM | POA: Insufficient documentation

## 2014-08-20 DIAGNOSIS — R5381 Other malaise: Secondary | ICD-10-CM | POA: Insufficient documentation

## 2014-08-20 NOTE — Telephone Encounter (Signed)
Left message for [name] to call back re: awaiting order from Dr. Bella Kennedy for AFO.

## 2014-08-20 NOTE — Therapy (Signed)
[facility] [address] [place], [place], [address] Phone: [phone]   Fax:  [phone]  Physical Therapy Treatment  Patient Details  Name: Richard Bridges MRN: [id number] Date of Birth: 07-30-1975 Referring Provider:  Terese Door, MD  Encounter Date: 08/20/2014      PT End of Session - 08/20/14 1420    Visit Number 21   Number of Visits 32   Date for PT Re-Evaluation 09/29/14   Authorization Type $35 copay, no visit limit   PT Start Time 1150   PT Stop Time 1230   PT Time Calculation (min) 40 min   Equipment Utilized During Treatment Gait belt   Activity Tolerance Patient tolerated treatment well   Behavior During Therapy [facility] for tasks assessed/performed      Past Medical History  Diagnosis Date  . Fractures involving multiple body regions 04/14/2014    s/p multi-trauma with pelvic and spinal fractures    Past Surgical History  Procedure Laterality Date  . Spine surgery  04/15/2014    laminectomy s/p multi-trauma  . 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  Visit Diagnosis:  Abnormality of gait  Generalized muscle weakness      Subjective Assessment - 08/20/14 1157    Subjective The patient reports that he was seen at ED last week for UTI, and had a groin infection.    Patient is accompained by: Family member   Currently in Pain? Yes   Pain Score 2    Pain Location Groin     Gait: 75 feet , rest, then 50 feet with RW with CGA with cues on L heel coming down to floor at initial stance phase using R toe off brace + air cast and w/c following for safety.  SELF CARE/HOME MANAGEMENT: Discussed home progression once we receive order for AFO and time to obtain braces (lower due to off the shelf).  Also discussed L air cast possibly to work on improved stability while still allowing patient to feel ankle movement for return of motor control.  Patient  has returned to standing at home for exercise after a set back last week due to illness.  PT encouraged him to continue progressing his activity level in the home.       PT Short Term Goals - 08/20/14 1421    PT SHORT TERM GOAL #1   Title The patient will perform HEP progression with assist from family as needed.   Baseline Target date 08/30/2014   Time 4   Period Weeks   Status On-going   PT SHORT TERM GOAL #2   Title The patient will move sit<>stand to RW with close supervision (requires CGA to min A 6/22)   Baseline Target date 08/30/2014   Time 4   Period Weeks   Status On-going   PT SHORT TERM GOAL #3   Title The patient will ambulate with RW and min A x 75 feet (25 ft on 6/22/).   Baseline Patient ambulated CGA with RW x 75 feet on 08/20/2014   Status Achieved   PT SHORT TERM GOAL #4   Title The patient will have orthotic needs for ankle control met.   Baseline Target date 08/30/2014   Time 4   Period Weeks   Status On-going   PT SHORT TERM GOAL #5   Title The patient will maintain standing x 5 minutes for improved tolerance to  LE weight bearing with SBA.  pt is now standing 2 x 3 minutes    Baseline Met on 08/06/2014 with patient increasing standing time when therapist encouraged > standing time   Time 4   Period Weeks   Status Achieved           PT Long Term Goals - 08/01/14 1413    PT LONG TERM GOAL #1   Title The patient will ambulate with RW x 200 ft with supervision for household ambulation.   Baseline Modified target date to 09/29/2014 (on 6/22, patient ambulates 25 feet with min A of 1 person with RW)   Time 8   Period Weeks   Status Revised   PT LONG TERM GOAL #2   Title The patient will perform standing activities x 10 minutes nonstop for return to ADL activities.   Baseline Modified target date to 09/29/2014 (on 6/22, patient/family report standing x 3-4 minutes 1x/day)   Time 8   Period Weeks   Status Revised   PT LONG TERM GOAL #3   Title The patient  will transfer sit<>stand to RW or countertop modified indep.   Baseline Target date 09/29/2014   Time 8   Period Weeks   Status Revised   PT LONG TERM GOAL #4   Title The patient will have L LE strength 3/5 and R LE strength 2/5 (for hip flexion, bilat knee flexion/ext).    Baseline Target date 08/04/2014- see MMT in notes 08/01/2014   Time 8   Period Weeks   Status Achieved               Plan - 08/20/14 1422    Clinical Impression Statement The patient met STG for ambulation x 75 feet.  He has improved ankle control R side with air cast + toe off.  Patient still continues with some med/lateral instability L ankle due to tone, however it improves with weight bearing and PT monitoring.   PT Next Visit Plan Core and LE strengthening, isolated ankle motor control and stretching. Gait training with R air cast and toe off brace.   Consulted and Agree with Plan of Care Patient;Family member/caregiver   Family Member Consulted sister        Problem List There are no active problems to display for this patient.   [place], [place] 08/20/2014, 2:23 PM  [facility] [address] [place], [place], [address] Phone: [phone]   Fax:  [phone]

## 2014-08-21 ENCOUNTER — Ambulatory Visit: Payer: PRIVATE HEALTH INSURANCE | Admitting: Physical Therapy

## 2014-08-21 ENCOUNTER — Encounter: Payer: Self-pay | Admitting: Physical Therapy

## 2014-08-21 DIAGNOSIS — R269 Unspecified abnormalities of gait and mobility: Secondary | ICD-10-CM

## 2014-08-21 DIAGNOSIS — Z7409 Other reduced mobility: Secondary | ICD-10-CM

## 2014-08-21 DIAGNOSIS — R5381 Other malaise: Secondary | ICD-10-CM

## 2014-08-21 DIAGNOSIS — M6281 Muscle weakness (generalized): Secondary | ICD-10-CM

## 2014-08-21 NOTE — Therapy (Signed)
[facility] [address] [place], [place], [address] Phone: [phone]   Fax:  [phone]  Physical Therapy Treatment  Patient Details  Name: Richard Bridges MRN: [id number] Date of Birth: 1975-10-20 Referring Provider:  Terese Door, MD  Encounter Date: 08/21/2014      PT End of Session - 08/21/14 1237    Visit Number 22   Number of Visits 32   Date for PT Re-Evaluation 09/29/14   Authorization Type $35 copay, no visit limit   PT Start Time 8850   PT Stop Time 1315   PT Time Calculation (min) 40 min   Equipment Utilized During Treatment Gait belt   Activity Tolerance Patient tolerated treatment well   Behavior During Therapy [facility] for tasks assessed/performed      Past Medical History  Diagnosis Date  . Fractures involving multiple body regions 04/14/2014    s/p multi-trauma with pelvic and spinal fractures    Past Surgical History  Procedure Laterality Date  . Spine surgery  04/15/2014    laminectomy s/p multi-trauma  . 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  Visit Diagnosis:  Abnormality of gait  Generalized muscle weakness  Physical deconditioning  Decreased functional mobility and endurance      Subjective Assessment - 08/21/14 1237    Subjective No new complaints. No falls or pain to report. Back up to standing for 5 minutes 1x a day   Currently in Pain? No/denies   Pain Score 0-No pain      Treatment: Gait:  75 ft x 1, 50 ft x1 with walker and toe off with air cast to right foot/ankle. Cues on posture and for left ankle control in stance with gait. Min guard assist to min assist as gait progressed due to fatigue. Seated rest break between gait trial due to fatigue as well.  Exercise: Seated edge of mat with inverted chair with pillow behind pt: cues on ex form and technique. Partial curl ups x 10 reps Lateral side bends for elbow tap to  mat and back up, alternating sides, x 10 each side  Performed above while seated on green air disc x 10 each  Long siting on mat with inverted chair and pillow behind pt: Forward sit/curl ups 2 sets of 10 reps Lateral reaching across body with curl ups x 10 each side with rest breaks needed through out.         PT Short Term Goals - 08/20/14 1421    PT SHORT TERM GOAL #1   Title The patient will perform HEP progression with assist from family as needed.   Baseline Target date 08/30/2014   Time 4   Period Weeks   Status On-going   PT SHORT TERM GOAL #2   Title The patient will move sit<>stand to RW with close supervision (requires CGA to min A 6/22)   Baseline Target date 08/30/2014   Time 4   Period Weeks   Status On-going   PT SHORT TERM GOAL #3   Title The patient will ambulate with RW and min A x 75 feet (25 ft on 6/22/).   Baseline Patient ambulated CGA with RW x 75 feet on 08/20/2014   Status Achieved   PT SHORT TERM GOAL #4   Title The patient will have orthotic needs for ankle control met.   Baseline Target date 08/30/2014   Time 4   Period  Weeks   Status On-going   PT SHORT TERM GOAL #5   Title The patient will maintain standing x 5 minutes for improved tolerance to LE weight bearing with SBA.  pt is now standing 2 x 3 minutes    Baseline Met on 08/06/2014 with patient increasing standing time when therapist encouraged > standing time   Time 4   Period Weeks   Status Achieved           PT Long Term Goals - 08/01/14 1413    PT LONG TERM GOAL #1   Title The patient will ambulate with RW x 200 ft with supervision for household ambulation.   Baseline Modified target date to 09/29/2014 (on 6/22, patient ambulates 25 feet with min A of 1 person with RW)   Time 8   Period Weeks   Status Revised   PT LONG TERM GOAL #2   Title The patient will perform standing activities x 10 minutes nonstop for return to ADL activities.   Baseline Modified target date to 09/29/2014  (on 6/22, patient/family report standing x 3-4 minutes 1x/day)   Time 8   Period Weeks   Status Revised   PT LONG TERM GOAL #3   Title The patient will transfer sit<>stand to RW or countertop modified indep.   Baseline Target date 09/29/2014   Time 8   Period Weeks   Status Revised   PT LONG TERM GOAL #4   Title The patient will have L LE strength 3/5 and R LE strength 2/5 (for hip flexion, bilat knee flexion/ext).    Baseline Target date 08/04/2014- see MMT in notes 08/01/2014   Time 8   Period Weeks   Status Achieved               Plan - 08/22/14 0854    Clinical Impression Statement Pt quick to fatigue today and challenged significantly with core strengthening exercises/ Pt makins steady progress toward goals.    PT Next Visit Plan Gait training , core and LE strengthening, ankle isolated motor control and stretching         Problem List There are no active problems to display for this patient.   Willow Ora 08/22/2014, 8:54 AM  Willow Ora, PTA, [facility] [address] [place], [place] [address] [phone] 08/22/2014, 8:55 AM

## 2014-08-24 ENCOUNTER — Ambulatory Visit: Payer: PRIVATE HEALTH INSURANCE | Admitting: Physical Therapy

## 2014-08-27 ENCOUNTER — Telehealth: Payer: Self-pay | Admitting: Rehabilitative and Restorative Service Providers"

## 2014-08-27 NOTE — Telephone Encounter (Signed)
Lehman Prom, clinic manager called to discuss issue with receiving brace.  Uncertain of current contact # in EPIC fax info for Dr. Bella Kennedy.  Recommended PT fax order to [phone].  PT did on 08/27/2014.

## 2014-08-29 ENCOUNTER — Encounter: Payer: Self-pay | Admitting: Physical Therapy

## 2014-08-29 ENCOUNTER — Ambulatory Visit: Payer: PRIVATE HEALTH INSURANCE | Admitting: Physical Therapy

## 2014-08-29 DIAGNOSIS — R269 Unspecified abnormalities of gait and mobility: Secondary | ICD-10-CM

## 2014-08-29 DIAGNOSIS — M6281 Muscle weakness (generalized): Secondary | ICD-10-CM

## 2014-08-29 DIAGNOSIS — Z7409 Other reduced mobility: Secondary | ICD-10-CM

## 2014-08-29 DIAGNOSIS — R5381 Other malaise: Secondary | ICD-10-CM

## 2014-08-29 NOTE — Therapy (Signed)
[facility] [address] [place], [place], [address] Phone: [phone]   Fax:  [phone]  Physical Therapy Treatment  Patient Details  Name: Richard Bridges MRN: [id number] Date of Birth: [birth year] Referring Provider:  Terese Door, MD  Encounter Date: 08/29/2014   08/29/14 1236  PT Visits / Re-Eval  Visit Number 23  Number of Visits 32  Date for PT Re-Evaluation 09/29/14  Authorization  Authorization Type $35 copay, no visit limit  PT Time Calculation  PT Start Time 1232  PT Stop Time 1315  PT Time Calculation (min) 43 min  PT - End of Session  Equipment Utilized During Treatment Gait belt  Activity Tolerance Patient tolerated treatment well  Behavior During Therapy [name] for tasks assessed/performed     Past Medical History  Diagnosis Date  . Fractures involving multiple body regions 04/14/2014    s/p multi-trauma with pelvic and spinal fractures    Past Surgical History  Procedure Laterality Date  . Spine surgery  04/15/2014    laminectomy s/p multi-trauma  . 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   08/29/14 1235  Symptoms/Limitations  Subjective No new complaints. No falls or pain to report.   Pain Assessment  Currently in Pain? No/denies  Pain Score 0   Visit Diagnosis:  Abnormality of gait  Generalized muscle weakness  Physical deconditioning  Decreased functional mobility and endurance   Treatment: Gait 60 feet x 2 reps with walker, air cast/toe off brace to left ankle/foot and air cast to right ankle (on second walk only). Min assist for balance. Cues on posture and right ankle control with 1st rep. Improved control noted on second rep after air cast applied.   Neuro re-ed: On mat with blue p-roll: frequent rest breaks needed due to fatigue Tall kneeling: partial sit backs 2 sets of 10 reps Tall kneeling: with dowel rod UE raises x  10 reps Tall kneeling: ball toss x 2-3 minutes total        PT Short Term Goals - 08/20/14 1421    PT SHORT TERM GOAL #1   Title The patient will perform HEP progression with assist from family as needed.   Baseline Target date 08/30/2014   Time 4   Period Weeks   Status On-going   PT SHORT TERM GOAL #2   Title The patient will move sit<>stand to RW with close supervision (requires CGA to min A 6/22)   Baseline Target date 08/30/2014   Time 4   Period Weeks   Status On-going   PT SHORT TERM GOAL #3   Title The patient will ambulate with RW and min A x 75 feet (25 ft on 6/22/).   Baseline Patient ambulated CGA with RW x 75 feet on 08/20/2014   Status Achieved   PT SHORT TERM GOAL #4   Title The patient will have orthotic needs for ankle control met.   Baseline Target date 08/30/2014   Time 4   Period Weeks   Status On-going   PT SHORT TERM GOAL #5   Title The patient will maintain standing x 5 minutes for improved tolerance to LE weight bearing with SBA.  pt is now standing 2 x 3 minutes    Baseline Met on 08/06/2014 with patient increasing standing time when therapist encouraged > standing time   Time 4   Period Weeks   Status Achieved  PT Long Term Goals - 08/01/14 1413    PT LONG TERM GOAL #1   Title The patient will ambulate with RW x 200 ft with supervision for household ambulation.   Baseline Modified target date to 09/29/2014 (on 6/22, patient ambulates 25 feet with min A of 1 person with RW)   Time 8   Period Weeks   Status Revised   PT LONG TERM GOAL #2   Title The patient will perform standing activities x 10 minutes nonstop for return to ADL activities.   Baseline Modified target date to 09/29/2014 (on 6/22, patient/family report standing x 3-4 minutes 1x/day)   Time 8   Period Weeks   Status Revised   PT LONG TERM GOAL #3   Title The patient will transfer sit<>stand to RW or countertop modified indep.   Baseline Target date 09/29/2014   Time 8    Period Weeks   Status Revised   PT LONG TERM GOAL #4   Title The patient will have L LE strength 3/5 and R LE strength 2/5 (for hip flexion, bilat knee flexion/ext).    Baseline Target date 08/04/2014- see MMT in notes 08/01/2014   Time 8   Period Weeks   Status Achieved        08/29/14 1236  Plan  Clinical Impression Statement Pt with increased gait tolerance today. Used air cast on left leg in addition to the air cast/toe off brace on the right leg with pt reporting increased bil ankle control and less energy needed with gait. Pt fatigued after gait, needed frequent rest breaks with mat activities for core and postural strengthening.                      Pt will benefit from skilled therapeutic intervention in order to improve on the following deficits Abnormal gait;Decreased balance;Decreased mobility;Decreased activity tolerance;Decreased range of motion;Difficulty walking;Impaired flexibility;Impaired sensation;Impaired tone;Increased muscle spasms;Decreased strength;Pain  Rehab Potential Good  PT Frequency 2x / week  PT Duration 8 weeks  PT Treatment/Interventions ADLs/Self Care Home Management;Electrical Stimulation;Functional mobility training;Therapeutic activities;Patient/family education;Passive range of motion;Wheelchair mobility training;Therapeutic exercise;DME Instruction;Gait training;Balance training;Neuromuscular re-education;Stair training  PT Next Visit Plan Gait training , core and LE strengthening, ankle isolated motor control and stretching   Consulted and Agree with Plan of Care Patient;Family member/caregiver  Family Member Consulted sister       Problem List There are no active problems to display for this patient.   Willow Ora 08/31/2014, 8:25 AM  Willow Ora, PTA, [facility] [address] [place], [place] [address] [phone] 08/31/2014, 8:25 AM

## 2014-08-31 ENCOUNTER — Ambulatory Visit: Payer: PRIVATE HEALTH INSURANCE | Admitting: Physical Therapy

## 2014-08-31 ENCOUNTER — Encounter: Payer: Self-pay | Admitting: Physical Therapy

## 2014-08-31 DIAGNOSIS — Z7409 Other reduced mobility: Secondary | ICD-10-CM

## 2014-08-31 DIAGNOSIS — R269 Unspecified abnormalities of gait and mobility: Secondary | ICD-10-CM

## 2014-08-31 DIAGNOSIS — M6281 Muscle weakness (generalized): Secondary | ICD-10-CM

## 2014-08-31 DIAGNOSIS — R5381 Other malaise: Secondary | ICD-10-CM

## 2014-08-31 DIAGNOSIS — Z789 Other specified health status: Secondary | ICD-10-CM

## 2014-08-31 NOTE — Therapy (Signed)
[facility] [address] [place], [place], [address] Phone: [phone]   Fax:  [phone]  Physical Therapy Treatment  Patient Details  Name: Richard Bridges MRN: [id number] Date of Birth: 11/28/1975 Referring Provider:  Terese Door, MD  Encounter Date: 08/31/2014      PT End of Session - 08/31/14 1235    Visit Number 24   Number of Visits 32   Date for PT Re-Evaluation 09/29/14   Authorization Type $35 copay, no visit limit   PT Start Time 1230   PT Stop Time 1315   PT Time Calculation (min) 45 min   Equipment Utilized During Treatment Gait belt   Activity Tolerance Patient tolerated treatment well   Behavior During Therapy [facility] for tasks assessed/performed      Past Medical History  Diagnosis Date  . Fractures involving multiple body regions 04/14/2014    s/p multi-trauma with pelvic and spinal fractures    Past Surgical History  Procedure Laterality Date  . Spine surgery  04/15/2014    laminectomy s/p multi-trauma  . 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  Visit Diagnosis:  Abnormality of gait  Generalized muscle weakness  Physical deconditioning  Decreased functional mobility and endurance  Impaired mobility and ADLs      Subjective Assessment - 08/31/14 1235    Subjective No new complaints. No falls or pain to report.    Currently in Pain? No/denies   Pain Score 0-No pain     Treatment: Gait: 115 feet x 1 with walker, air cast to bil ankles and toe off brace to right foot. Min assist to min guard assist. Cues on posture, for right knee flexion with swing phase and for step length.   Neuro re-ed: Long sitting on mat Curl ups (with inverted chair behind pt) x 10 reps Curl ups with lateral rotation, alternating sides, x 10 reps to each side  Seated edge of mat with blue bolster/pillow behind pt: curl ups with OH press with 1#  weighted ball, pt lying back with ball overhead (OH) and bringing ball down toward knees as he sit up. 10 reps with rest breaks needed.        PT Short Term Goals - 08/20/14 1421    PT SHORT TERM GOAL #1   Title The patient will perform HEP progression with assist from family as needed.   Baseline Target date 08/30/2014   Time 4   Period Weeks   Status On-going   PT SHORT TERM GOAL #2   Title The patient will move sit<>stand to RW with close supervision (requires CGA to min A 6/22)   Baseline Target date 08/30/2014   Time 4   Period Weeks   Status On-going   PT SHORT TERM GOAL #3   Title The patient will ambulate with RW and min A x 75 feet (25 ft on 6/22/).   Baseline Patient ambulated CGA with RW x 75 feet on 08/20/2014   Status Achieved   PT SHORT TERM GOAL #4   Title The patient will have orthotic needs for ankle control met.   Baseline Target date 08/30/2014   Time 4   Period Weeks   Status On-going   PT SHORT TERM GOAL #5   Title The patient will maintain standing x 5 minutes for improved tolerance to LE weight bearing with SBA.  pt is now standing 2 x 3  minutes    Baseline Met on 08/06/2014 with patient increasing standing time when therapist encouraged > standing time   Time 4   Period Weeks   Status Achieved           PT Long Term Goals - 08/01/14 1413    PT LONG TERM GOAL #1   Title The patient will ambulate with RW x 200 ft with supervision for household ambulation.   Baseline Modified target date to 09/29/2014 (on 6/22, patient ambulates 25 feet with min A of 1 person with RW)   Time 8   Period Weeks   Status Revised   PT LONG TERM GOAL #2   Title The patient will perform standing activities x 10 minutes nonstop for return to ADL activities.   Baseline Modified target date to 09/29/2014 (on 6/22, patient/family report standing x 3-4 minutes 1x/day)   Time 8   Period Weeks   Status Revised   PT LONG TERM GOAL #3   Title The patient will transfer sit<>stand  to RW or countertop modified indep.   Baseline Target date 09/29/2014   Time 8   Period Weeks   Status Revised   PT LONG TERM GOAL #4   Title The patient will have L LE strength 3/5 and R LE strength 2/5 (for hip flexion, bilat knee flexion/ext).    Baseline Target date 08/04/2014- see MMT in notes 08/01/2014   Time 8   Period Weeks   Status Achieved           Plan - 08/31/14 1235    Clinical Impression Statement Pt with increased gait distance today and less overall assistance needed. Making great progress toward gait goals.    Pt will benefit from skilled therapeutic intervention in order to improve on the following deficits Abnormal gait;Decreased balance;Decreased mobility;Decreased activity tolerance;Decreased range of motion;Difficulty walking;Impaired flexibility;Impaired sensation;Impaired tone;Increased muscle spasms;Decreased strength;Pain   Rehab Potential Good   PT Frequency 2x / week   PT Duration 8 weeks   PT Treatment/Interventions ADLs/Self Care Home Management;Electrical Stimulation;Functional mobility training;Therapeutic activities;Patient/family education;Passive range of motion;Wheelchair mobility training;Therapeutic exercise;DME Instruction;Gait training;Balance training;Neuromuscular re-education;Stair training   PT Next Visit Plan Gait training , core and LE strengthening, ankle isolated motor control and stretching    Consulted and Agree with Plan of Care Patient;Family member/caregiver   Family Member Consulted sister        Problem List There are no active problems to display for this patient.   Willow Ora 09/01/2014, 12:31 PM  Willow Ora, PTA, [facility] [address], [place], [place] [address] [phone] 09/01/2014, 12:31 PM

## 2014-09-04 ENCOUNTER — Ambulatory Visit: Payer: PRIVATE HEALTH INSURANCE | Admitting: Rehabilitative and Restorative Service Providers"

## 2014-09-04 DIAGNOSIS — M6281 Muscle weakness (generalized): Secondary | ICD-10-CM

## 2014-09-04 DIAGNOSIS — R269 Unspecified abnormalities of gait and mobility: Secondary | ICD-10-CM | POA: Diagnosis not present

## 2014-09-04 NOTE — Patient Instructions (Signed)
Mini Squat: Double Leg   With feet shoulder width apart HOLD ONTO SINK FOR SUPPORT, do a mini squat. Keep knees in line with second toe. Knees do not go past toes. Repeat _10__ times per set. 1-2 times per day when standing. Have help initially to watch ankles to ensure good alignment.  http://plyo.exer.us/70   Copyright  VHI. All rights reserved.

## 2014-09-04 NOTE — Therapy (Signed)
[facility] [address] [place], [place], [address] Phone: [phone]   Fax:  [phone]  Physical Therapy Treatment  Patient Details  Name: Richard Bridges MRN: [id number] Date of Birth: 1975-08-15 Referring Provider:  Terese Door, MD  Encounter Date: 09/04/2014      PT End of Session - 09/04/14 1413    Visit Number 25   Number of Visits 32   Date for PT Re-Evaluation 09/29/14   Authorization Type $35 copay, no visit limit   PT Start Time 1240   PT Stop Time 1320   PT Time Calculation (min) 40 min   Equipment Utilized During Treatment Gait belt   Activity Tolerance Patient tolerated treatment well   Behavior During Therapy [facility] for tasks assessed/performed      Past Medical History  Diagnosis Date  . Fractures involving multiple body regions 04/14/2014    s/p multi-trauma with pelvic and spinal fractures    Past Surgical History  Procedure Laterality Date  . Spine surgery  04/15/2014    laminectomy s/p multi-trauma  . 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  Visit Diagnosis:  Abnormality of gait  Generalized muscle weakness      Subjective Assessment - 09/04/14 1304    Subjective Patient standing at home 3x 6 minutes.      NEUROMUSCULAR RE-EDUCATION: Standing in the parallel bars decreasing UE support and using muscle contraction to attempt to control muscle spasms Mini squats decreasing dependence on UEs with CGA x 10 reps Standing with feet in stride position working on anterior/posterior weight shift with min A due to R knee occasionally giving way needing assist to recover Standing weight shift emphasizing anterior weight shift Sit<>stand x 10 reps without using RW upon rising with CGA to min A for tactile cues on anterior translation of hips over knees/ankles  Gait: Ambulation with RW and R toe off + air cast x 115 feet and L air cast with  CGA and cues on hip initiation        PT Education - 09/04/14 1411    Education provided Yes   Education Details HEP: mini squats, recommended more regular heel cord stretching.   Person(s) Educated Patient;Caregiver(s)   Methods Demonstration;Explanation   Comprehension Verbalized understanding;Returned demonstration          PT Short Term Goals - 09/04/14 1414    PT SHORT TERM GOAL #1   Title The patient will perform HEP progression with assist from family as needed.   Baseline Met on 09/04/2014 with standing and stretching currently main components of HEP.   Time 4   Period Weeks   Status Achieved   PT SHORT TERM GOAL #2   Title The patient will move sit<>stand to RW with close supervision (requires CGA to min A 6/22)   Baseline Pt can move w/c>stand at sink modified indep at home.  He can do with supervision with occasional CGA in the clinic on 09/04/2014   Time 4   Period Weeks   Status Partially Met   PT SHORT TERM GOAL #3   Title The patient will ambulate with RW and min A x 75 feet (25 ft on 6/22/).   Baseline Patient ambulated CGA with RW x 75 feet on 08/20/2014   Status Achieved   PT SHORT TERM GOAL #4   Title The patient will have orthotic needs for ankle control met.  Baseline Met, awaiting AFO and air cast for R LE, may assess further for L ankle needs once ambulating more in home.   Time 4   Period Weeks   Status Achieved   PT SHORT TERM GOAL #5   Title The patient will maintain standing x 5 minutes for improved tolerance to LE weight bearing with SBA.  pt is now standing 2 x 3 minutes    Baseline Met on 08/06/2014 with patient increasing standing time when therapist encouraged > standing time   Time 4   Period Weeks   Status Achieved           PT Long Term Goals - 08/01/14 1413    PT LONG TERM GOAL #1   Title The patient will ambulate with RW x 200 ft with supervision for household ambulation.   Baseline Modified target date to 09/29/2014 (on 6/22,  patient ambulates 25 feet with min A of 1 person with RW)   Time 8   Period Weeks   Status Revised   PT LONG TERM GOAL #2   Title The patient will perform standing activities x 10 minutes nonstop for return to ADL activities.   Baseline Modified target date to 09/29/2014 (on 6/22, patient/family report standing x 3-4 minutes 1x/day)   Time 8   Period Weeks   Status Revised   PT LONG TERM GOAL #3   Title The patient will transfer sit<>stand to RW or countertop modified indep.   Baseline Target date 09/29/2014   Time 8   Period Weeks   Status Revised   PT LONG TERM GOAL #4   Title The patient will have L LE strength 3/5 and R LE strength 2/5 (for hip flexion, bilat knee flexion/ext).    Baseline Target date 08/04/2014- see MMT in notes 08/01/2014   Time 8   Period Weeks   Status Achieved               Plan - 09/04/14 1415    Clinical Impression Statement Patient continuing to make progress towards STG meeting HEP goal, orthotic goal, and sit<>stand goal partially.  PT to progress to standing decreasing UE support to patient tolerance.   PT Next Visit Plan Gait training , core and LE strengthening, ankle isolated motor control and stretching   Standing activities decreasing UE support.  Add wall bumps to home.   Consulted and Agree with Plan of Care Patient;Family member/caregiver   Family Member Consulted sister        Problem List There are no active problems to display for this patient.   [place], [place] 09/04/2014, 2:22 PM  [facility] [address] [place], [place], [address] Phone: [phone]   Fax:  [phone]

## 2014-09-06 ENCOUNTER — Ambulatory Visit: Payer: PRIVATE HEALTH INSURANCE | Admitting: Rehabilitative and Restorative Service Providers"

## 2014-09-06 DIAGNOSIS — R269 Unspecified abnormalities of gait and mobility: Secondary | ICD-10-CM

## 2014-09-06 DIAGNOSIS — M6281 Muscle weakness (generalized): Secondary | ICD-10-CM

## 2014-09-07 NOTE — Therapy (Signed)
[facility] [address] [place], [place], [address] Phone: [phone]   Fax:  [phone]  Physical Therapy Treatment  Patient Details  Name: Richard Bridges MRN: [id number] Date of Birth: [birth year] Referring Provider:  Terese Door, MD  Encounter Date: 09/06/2014      PT End of Session - 09/06/14 1449    Visit Number 26   Number of Visits 32   Date for PT Re-Evaluation 09/29/14   Authorization Type $35 copay, no visit limit   PT Start Time 1100   PT Stop Time 1150   PT Time Calculation (min) 50 min   Equipment Utilized During Treatment Gait belt   Activity Tolerance Patient tolerated treatment well   Behavior During Therapy [facility] for tasks assessed/performed      Past Medical History  Diagnosis Date  . Fractures involving multiple body regions 04/14/2014    s/p multi-trauma with pelvic and spinal fractures    Past Surgical History  Procedure Laterality Date  . Spine surgery  04/15/2014    laminectomy s/p multi-trauma  . 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  Visit Diagnosis:  Abnormality of gait  Generalized muscle weakness      Subjective Assessment - 09/06/14 1443    Subjective Patient brought walker borrowed from home to begin standing      ORTHOTICS/PROSTHETICS CHECKOUT: Patient initially ambulated x 115 feet with ottobock R AFO + air cast R ankle.  He had increased R ankle inversion/supination. PT switched braces to R toe off brace + air cast.  Due to support bar on toe off coming laterally around ankle, it helps control some of the patient's supination/inversion of the ankle.  Patient ambulated 50 feet with RW and R toe off + air cast with CGA.  NEUROMUSCULAR RE-EDUCATION: Performed standing balance activities decreasing reliance on UEs for support Sit<>stand with SBA for safety  SELF CARE/HOME MANAGEMENT: Discussed home walking and safety  with family members verbalizing understanding of need to watch L ankle for positioning to avoid ankle injury.  Discussed having one person CGA and one to follow with wheelchair initially due to LE muscle spasms.  THERAPEUTIC EXERCISE: Seated L heel cord stretch and hamstring stretch Standing L heel cord stretch with R foot anteriorly positioned in stride with tactile cues for ankle positioning       PT Short Term Goals - 09/04/14 1414    PT SHORT TERM GOAL #1   Title The patient will perform HEP progression with assist from family as needed.   Baseline Met on 09/04/2014 with standing and stretching currently main components of HEP.   Time 4   Period Weeks   Status Achieved   PT SHORT TERM GOAL #2   Title The patient will move sit<>stand to RW with close supervision (requires CGA to min A 6/22)   Baseline Pt can move w/c>stand at sink modified indep at home.  He can do with supervision with occasional CGA in the clinic on 09/04/2014   Time 4   Period Weeks   Status Partially Met   PT SHORT TERM GOAL #3   Title The patient will ambulate with RW and min A x 75 feet (25 ft on 6/22/).   Baseline Patient ambulated CGA with RW x 75 feet on 08/20/2014   Status Achieved   PT SHORT TERM GOAL #4   Title The patient will have orthotic needs for  ankle control met.   Baseline Met, awaiting AFO and air cast for R LE, may assess further for L ankle needs once ambulating more in home.   Time 4   Period Weeks   Status Achieved   PT SHORT TERM GOAL #5   Title The patient will maintain standing x 5 minutes for improved tolerance to LE weight bearing with SBA.  pt is now standing 2 x 3 minutes    Baseline Met on 08/06/2014 with patient increasing standing time when therapist encouraged > standing time   Time 4   Period Weeks   Status Achieved           PT Long Term Goals - 08/01/14 1413    PT LONG TERM GOAL #1   Title The patient will ambulate with RW x 200 ft with supervision for household  ambulation.   Baseline Modified target date to 09/29/2014 (on 6/22, patient ambulates 25 feet with min A of 1 person with RW)   Time 8   Period Weeks   Status Revised   PT LONG TERM GOAL #2   Title The patient will perform standing activities x 10 minutes nonstop for return to ADL activities.   Baseline Modified target date to 09/29/2014 (on 6/22, patient/family report standing x 3-4 minutes 1x/day)   Time 8   Period Weeks   Status Revised   PT LONG TERM GOAL #3   Title The patient will transfer sit<>stand to RW or countertop modified indep.   Baseline Target date 09/29/2014   Time 8   Period Weeks   Status Revised   PT LONG TERM GOAL #4   Title The patient will have L LE strength 3/5 and R LE strength 2/5 (for hip flexion, bilat knee flexion/ext).    Baseline Target date 08/04/2014- see MMT in notes 08/01/2014   Time 8   Period Weeks   Status Achieved          Plan - 09/06/14 1407    Clinical Impression Statement Orthotist delivered Ottobock AFO (was brace we initially trialed before toe off brace) and patient benefits greater from toe off brace due to lateral placement of support bar vs. medial placement (which allows for ankle inversion).  PT f/u with brace company to ensure we switch braces based on patient need.  They agree to order R toe off brace.     PT Next Visit Plan Gait training , core and LE strengthening, ankle isolated motor control and stretching   Standing activities decreasing UE support.  Add wall bumps to home.   Consulted and Agree with Plan of Care Patient;Family member/caregiver   Family Member Consulted sister        Problem List There are no active problems to display for this patient.   [place], [place] 09/07/2014, 9:11 AM  [facility] [address] [place], [place], [address] Phone: [phone]   Fax:  [phone]

## 2014-09-10 ENCOUNTER — Ambulatory Visit: Payer: PRIVATE HEALTH INSURANCE | Attending: Psychiatry | Admitting: Rehabilitative and Restorative Service Providers"

## 2014-09-10 DIAGNOSIS — Z7409 Other reduced mobility: Secondary | ICD-10-CM | POA: Diagnosis present

## 2014-09-10 DIAGNOSIS — Z658 Other specified problems related to psychosocial circumstances: Secondary | ICD-10-CM | POA: Insufficient documentation

## 2014-09-10 DIAGNOSIS — M6281 Muscle weakness (generalized): Secondary | ICD-10-CM | POA: Diagnosis present

## 2014-09-10 DIAGNOSIS — R5381 Other malaise: Secondary | ICD-10-CM | POA: Diagnosis present

## 2014-09-10 DIAGNOSIS — R269 Unspecified abnormalities of gait and mobility: Secondary | ICD-10-CM

## 2014-09-10 NOTE — Therapy (Signed)
[facility] [address] [place], [place], [address] Phone: [phone]   Fax:  [phone]  Physical Therapy Treatment  Patient Details  Name: Richard Bridges MRN: [id number] Date of Birth: [birth year] Referring Provider:  Terese Door, MD  Encounter Date: 09/10/2014      PT End of Session - 09/10/14 1321    Visit Number 26   Number of Visits 32   Date for PT Re-Evaluation 09/29/14   Authorization Type $35 copay, no visit limit   PT Start Time 1150   PT Stop Time 1245   PT Time Calculation (min) 55 min   Activity Tolerance Patient tolerated treatment well   Behavior During Therapy [facility] for tasks assessed/performed      Past Medical History  Diagnosis Date  . Fractures involving multiple body regions 04/14/2014    s/p multi-trauma with pelvic and spinal fractures    Past Surgical History  Procedure Laterality Date  . Spine surgery  04/15/2014    laminectomy s/p multi-trauma  . 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  Visit Diagnosis:  Abnormality of gait  Generalized muscle weakness      Subjective Assessment - 09/10/14 1235    Subjective Patient did not get brace yet (no phone call) from [employer]  PT called @ end of session and patient to be seen Thursday @ 2pm.   Patient is accompained by: Family member   Patient Stated Goals Getting back to walking.   Currently in Pain? No/denies      THERAPEUTIC EXERCISE: Prone hip flexor stretch, prone quad stretch stabilizing R hip at neutral for deeper stretch Prone knee flexion using mirror for visual cues on foot position/knee position x 5 reps each side Prone hip extension with ankle supported on bolster lifting knee from mat Long sitting with hip adductor stretch, hamstring stretch, heel cord stretch, and toe flexion/extension  Quadriped knee to opposite elbow x 5 reps each side with min A for balance  support Quadriped UE/LE extension contralateral extremities with min A for balance support Prone on elbows up to hands and knees x 5 reps Heel sitting for quad stretch  Sci-fit x 8 minutes  THERAPEUTIC ACTIVITIES: Flooor<>wheelchair transfers x 2 reps first demonstrating 2 options (rotating trunk to do tall kneeling facing w/c or triceps dip to the floor facing away from chair).  Patient tried both ways with min A from PT and was able to return to w/c with CGA for safety.  Discussed bumping up steps on his bottom as quicker alternative to get him upstairs to access shower/tub for bathing.        PT Education - 09/10/14 1320    Education provided Yes   Education Details HEP: mini squats, long sitting, toe flexion/extension, prone lying   Person(s) Educated Patient;Caregiver(s)   Methods Explanation;Demonstration;Handout   Comprehension Returned demonstration;Verbalized understanding          PT Short Term Goals - 09/04/14 1414    PT SHORT TERM GOAL #1   Title The patient will perform HEP progression with assist from family as needed.   Baseline Met on 09/04/2014 with standing and stretching currently main components of HEP.   Time 4   Period Weeks   Status Achieved   PT SHORT TERM GOAL #2   Title The patient will move sit<>stand to RW with close supervision (requires CGA to min A 6/22)   Baseline  Pt can move w/c>stand at sink modified indep at home.  He can do with supervision with occasional CGA in the clinic on 09/04/2014   Time 4   Period Weeks   Status Partially Met   PT SHORT TERM GOAL #3   Title The patient will ambulate with RW and min A x 75 feet (25 ft on 6/22/).   Baseline Patient ambulated CGA with RW x 75 feet on 08/20/2014   Status Achieved   PT SHORT TERM GOAL #4   Title The patient will have orthotic needs for ankle control met.   Baseline Met, awaiting AFO and air cast for R LE, may assess further for L ankle needs once ambulating more in home.   Time 4    Period Weeks   Status Achieved   PT SHORT TERM GOAL #5   Title The patient will maintain standing x 5 minutes for improved tolerance to LE weight bearing with SBA.  pt is now standing 2 x 3 minutes    Baseline Met on 08/06/2014 with patient increasing standing time when therapist encouraged > standing time   Time 4   Period Weeks   Status Achieved           PT Long Term Goals - 08/01/14 1413    PT LONG TERM GOAL #1   Title The patient will ambulate with RW x 200 ft with supervision for household ambulation.   Baseline Modified target date to 09/29/2014 (on 6/22, patient ambulates 25 feet with min A of 1 person with RW)   Time 8   Period Weeks   Status Revised   PT LONG TERM GOAL #2   Title The patient will perform standing activities x 10 minutes nonstop for return to ADL activities.   Baseline Modified target date to 09/29/2014 (on 6/22, patient/family report standing x 3-4 minutes 1x/day)   Time 8   Period Weeks   Status Revised   PT LONG TERM GOAL #3   Title The patient will transfer sit<>stand to RW or countertop modified indep.   Baseline Target date 09/29/2014   Time 8   Period Weeks   Status Revised   PT LONG TERM GOAL #4   Title The patient will have L LE strength 3/5 and R LE strength 2/5 (for hip flexion, bilat knee flexion/ext).    Baseline Target date 08/04/2014- see MMT in notes 08/01/2014   Time 8   Period Weeks   Status Achieved               Plan - 09/10/14 1325    Clinical Impression Statement Patient still awaiting AFO- has appt for Thursday @ 2pm with [employer] [facility]  PT began initiating floor<>w/c transfers in preparation for patient bumping up steps on his bottom with assist from family.  PT to progress/practice and then demo with family to ensure safe before performing in his home environment.   PT Next Visit Plan Gait training , core and LE strengthening, ankle isolated motor control and stretching   Standing activities decreasing UE support.  Add wall  bumps to home.   Consulted and Agree with Plan of Care Patient;Family member/caregiver   Family Member Consulted sister        Problem List There are no active problems to display for this patient.   [place] , PT  09/10/2014, 1:42 PM  [facility] [address] [place], [place], [address] Phone: [phone]   Fax:  [phone]

## 2014-09-10 NOTE — Patient Instructions (Addendum)
Hamstring: Stretch (Long Sitting)   With left leg straight, tuck opposite foot near groin. Reach down until stretch is felt in back of thigh. Hold _30__ seconds. Relax. Repeat _2__ times. Do _1-2__ times a day. Repeat with other leg.  1) Then put R ankle on left knee and interlace fingers between toes, roll toes up/down for stretch 2) Switch sides and repeat.  Copyright  VHI. All rights reserved.   Mini Squat: Double Leg   With feet shoulder width apart HOLD ONTO SINK FOR SUPPORT, do a mini squat. Keep knees in line with second toe. Knees do not go past toes. Repeat _10__ times per set. 1-2 times per day when standing. Have help initially to watch ankles to ensure good alignment.  http://plyo.exer.us/70   Copyright  VHI. All rights reserved.    Flexion: Stretch - Quadriceps (Prone)  *Lie on your stomach to stretch front of hips and abdomen.  Can try to bring heel towards your bottom for strengthening legs x 5 reps each.  Copyright  VHI. All rights reserved.

## 2014-09-14 ENCOUNTER — Ambulatory Visit: Payer: PRIVATE HEALTH INSURANCE | Admitting: Rehabilitative and Restorative Service Providers"

## 2014-09-14 DIAGNOSIS — M6281 Muscle weakness (generalized): Secondary | ICD-10-CM

## 2014-09-14 DIAGNOSIS — R269 Unspecified abnormalities of gait and mobility: Secondary | ICD-10-CM

## 2014-09-14 NOTE — Therapy (Signed)
[facility] [address] [place], [place], [address] Phone: [phone]   Fax:  [phone]  Physical Therapy Treatment  Patient Details  Name: Richard Bridges MRN: [id number] Date of Birth: 10/15/1975 Referring Provider:  Lacey, David, MD  Encounter Date: 09/14/2014      PT End of Session - 09/14/14 1621    Visit Number 27   Number of Visits 32   Date for PT Re-Evaluation 09/29/14   Authorization Type $35 copay, no visit limit   PT Start Time 1110   PT Stop Time 1155   PT Time Calculation (min) 45 min   Activity Tolerance Patient tolerated treatment well   Behavior During Therapy [facility] for tasks assessed/performed      Past Medical History  Diagnosis Date  . Fractures involving multiple body regions 04/14/2014    s/p multi-trauma with pelvic and spinal fractures    Past Surgical History  Procedure Laterality Date  . Spine surgery  04/15/2014    laminectomy s/p multi-trauma  . 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  Visit Diagnosis:  Abnormality of gait  Generalized muscle weakness      Subjective Assessment - 09/14/14 1112    Subjective The patient got AFO yesterday and has spasms since putting toe off brace on this morning.   Patient is accompained by: Family member   Patient Stated Goals Getting back to walking.   Currently in Pain? No/denies      Gait: Ambulation with RW x 115 feet nonstop with cues on knee flexion to initiate swing phase bilaterally and cues on L ankle position + CGA Placed air cast on L ankle and ambulated x 40 feet with RW with CGA  Stair negotiation x 4 steps with L foot leading up and R foot leading down with step to pattern + bilateral HRs + min A for safety Discussed goal for home stairs recommending waiting until patient can perform 12-16 steps + ambulate 20 feet.  In patient's home, the ideal would be to leave w/c at first  floor, ascend steps, use RW to walk into bathroom.  Rest upstairs, then descend steps to return to w/c.  PT to work towards this goal for patient to access shower.  THERAPEUTIC EXERCISE: Patient performed standing step-ups to 4" block working on translating weight anteriorly before locking knee on L LE x 5 reps L and 3 steps R with min A in parallel bars.  Nu-step x 8 minutes level 2 with LEs only.       PT Short Term Goals - 09/04/14 1414    PT SHORT TERM GOAL #1   Title The patient will perform HEP progression with assist from family as needed.   Baseline Met on 09/04/2014 with standing and stretching currently main components of HEP.   Time 4   Period Weeks   Status Achieved   PT SHORT TERM GOAL #2   Title The patient will move sit<>stand to RW with close supervision (requires CGA to min A 6/22)   Baseline Pt can move w/c>stand at sink modified indep at home.  He can do with supervision with occasional CGA in the clinic on 09/04/2014   Time 4   Period Weeks   Status Partially Met   PT SHORT TERM GOAL #3   Title The patient will ambulate with RW and min A x 75 feet (25 ft on 6/22/).     Baseline Patient ambulated CGA with RW x 75 feet on 08/20/2014   Status Achieved   PT SHORT TERM GOAL #4   Title The patient will have orthotic needs for ankle control met.   Baseline Met, awaiting AFO and air cast for R LE, may assess further for L ankle needs once ambulating more in home.   Time 4   Period Weeks   Status Achieved   PT SHORT TERM GOAL #5   Title The patient will maintain standing x 5 minutes for improved tolerance to LE weight bearing with SBA.  pt is now standing 2 x 3 minutes    Baseline Met on 08/06/2014 with patient increasing standing time when therapist encouraged > standing time   Time 4   Period Weeks   Status Achieved           PT Long Term Goals - 08/01/14 1413    PT LONG TERM GOAL #1   Title The patient will ambulate with RW x 200 ft with supervision for  household ambulation.   Baseline Modified target date to 09/29/2014 (on 6/22, patient ambulates 25 feet with min A of 1 person with RW)   Time 8   Period Weeks   Status Revised   PT LONG TERM GOAL #2   Title The patient will perform standing activities x 10 minutes nonstop for return to ADL activities.   Baseline Modified target date to 09/29/2014 (on 6/22, patient/family report standing x 3-4 minutes 1x/day)   Time 8   Period Weeks   Status Revised   PT LONG TERM GOAL #3   Title The patient will transfer sit<>stand to RW or countertop modified indep.   Baseline Target date 09/29/2014   Time 8   Period Weeks   Status Revised   PT LONG TERM GOAL #4   Title The patient will have L LE strength 3/5 and R LE strength 2/5 (for hip flexion, bilat knee flexion/ext).    Baseline Target date 08/04/2014- see MMT in notes 08/01/2014   Time 8   Period Weeks   Status Achieved           Plan - 09/14/14 1623    Clinical Impression Statement The patient obtained AFO. Patient able to ambulate in clinic x 115 feet.  Recommend patient begin walking in the home with RW And AFO this weekend to begin improving household mobility.  PT beginning work on the steps to allow patient access to top level of home and therefore, access to a shower.   PT Next Visit Plan Stair training, gait training progressing distance ? treadmill for reciprocal pattern?, standing weight shifting and knee control for stairs.   Consulted and Agree with Plan of Care Patient;Family member/caregiver   Family Member Consulted sister        Problem List There are no active problems to display for this patient.   [place], [place] 09/14/2014, 4:28 PM  [name] [address] [place], [place], [address] Phone: [phone]   Fax:  [phone]

## 2014-09-17 ENCOUNTER — Ambulatory Visit: Payer: PRIVATE HEALTH INSURANCE | Admitting: Rehabilitative and Restorative Service Providers"

## 2014-09-17 DIAGNOSIS — M6281 Muscle weakness (generalized): Secondary | ICD-10-CM

## 2014-09-17 DIAGNOSIS — R269 Unspecified abnormalities of gait and mobility: Secondary | ICD-10-CM

## 2014-09-17 NOTE — Therapy (Signed)
[facility] [address] [place], [place], [address] Phone: [phone]   Fax:  [phone]  Physical Therapy Treatment  Patient Details  Name: Richard Bridges MRN: [id number] Date of Birth: [birth year] Referring Provider:  Terese Door, MD  Encounter Date: 09/17/2014      PT End of Session - 09/17/14 1319    Visit Number 28   Number of Visits 32   Date for PT Re-Evaluation 09/29/14   Authorization Type $35 copay, no visit limit   PT Start Time 1145   PT Stop Time 1230   PT Time Calculation (min) 45 min   Equipment Utilized During Treatment Gait belt   Activity Tolerance Patient tolerated treatment well   Behavior During Therapy [facility] for tasks assessed/performed      Past Medical History  Diagnosis Date  . Fractures involving multiple body regions 04/14/2014    s/p multi-trauma with pelvic and spinal fractures    Past Surgical History  Procedure Laterality Date  . Spine surgery  04/15/2014    laminectomy s/p multi-trauma  . 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  Visit Diagnosis:  Abnormality of gait  Generalized muscle weakness      Subjective Assessment - 09/17/14 1159    Subjective The patient did not ambulate at home due to feeling like he needs an aircast to stabilize the L ankle.   Patient is accompained by: Family member   Patient Stated Goals Getting back to walking.   Currently in Pain? No/denies      Gait: Ambulation on treadmill with min A to get on/off treadmill using railing, then ambulating x 2 minutes at 0.3-0.4 mph with tactile cues to initiate knee flexion at initial swing phase of gait. Ambulation with RW and CGA x 115 feet nonstop with cues on hip initiation and posture 0.44 ft/sec (measured gait speed over 10 ft distance)   THERAPEUTIC EXERCISE: Bridges supine adding marching with R hip dipping with R stabilization x 3  reps Bridges with feet on physioball working on level pelvic x 10 reps Prone attempted heel cord stretch pressing toes into mat with pain in toes Quadriped with hip abduction and knee extension 5 reps R and L sides Supine marching Supine rolling physioball towards/away from bottom with core stability Supine lifting physioball with LEs for abdominal contraction Seated toe flexion/extension Seated heel cord stretch Seated tennis ball roll under foot  Seated ankle DF and tried eversion (able to do on L ankle) Toe flexion/extension with assistance provided to stabilize ankles  Standing with cues on heel placement on ground for heel cord stretch with shoes removed.        PT Short Term Goals - 09/04/14 1414    PT SHORT TERM GOAL #1   Title The patient will perform HEP progression with assist from family as needed.   Baseline Met on 09/04/2014 with standing and stretching currently main components of HEP.   Time 4   Period Weeks   Status Achieved   PT SHORT TERM GOAL #2   Title The patient will move sit<>stand to RW with close supervision (requires CGA to min A 6/22)   Baseline Pt can move w/c>stand at sink modified indep at home.  He can do with supervision with occasional CGA in the clinic on 09/04/2014   Time 4   Period Weeks   Status Partially Met   PT SHORT TERM GOAL #  3   Title The patient will ambulate with RW and min A x 75 feet (25 ft on 6/22/).   Baseline Patient ambulated CGA with RW x 75 feet on 08/20/2014   Status Achieved   PT SHORT TERM GOAL #4   Title The patient will have orthotic needs for ankle control met.   Baseline Met, awaiting AFO and air cast for R LE, may assess further for L ankle needs once ambulating more in home.   Time 4   Period Weeks   Status Achieved   PT SHORT TERM GOAL #5   Title The patient will maintain standing x 5 minutes for improved tolerance to LE weight bearing with SBA.  pt is now standing 2 x 3 minutes    Baseline Met on 08/06/2014 with  patient increasing standing time when therapist encouraged > standing time   Time 4   Period Weeks   Status Achieved           PT Long Term Goals - 09/17/14 1320    PT LONG TERM GOAL #1   Title The patient will ambulate with RW x 200 ft with supervision for household ambulation.   Baseline Modified target date to 09/29/2014 (on 6/22, patient ambulates 25 feet with min A of 1 person with RW)   Time 8   Period Weeks   Status Revised   PT LONG TERM GOAL #2   Title The patient will perform standing activities x 10 minutes nonstop for return to ADL activities.   Baseline Modified target date to 09/29/2014 (on 6/22, patient/family report standing x 3-4 minutes 1x/day)   Time 8   Period Weeks   Status Revised   PT LONG TERM GOAL #3   Title The patient will transfer sit<>stand to RW or countertop modified indep.   Baseline Met on 09/17/2014   Time 8   Period Weeks   Status Achieved   PT LONG TERM GOAL #4   Title The patient will have L LE strength 3/5 and R LE strength 2/5 (for hip flexion, bilat knee flexion/ext).    Baseline Target date 08/04/2014- see MMT in notes 08/01/2014   Time 8   Period Weeks   Status Achieved               Plan - 09/17/14 1321    Clinical Impression Statement The patient has met 2 LTGs.  PT to progress to remaining 2 LTGs and renew/update goals on 09/29/2014.   PT Next Visit Plan Stair training, gait training progressing distance ? treadmill for reciprocal pattern?, standing weight shifting and knee control for stairs.  Cert due 9/37/1696   Consulted and Agree with Plan of Care Patient;Family member/caregiver   Family Member Consulted sister        Problem List There are no active problems to display for this patient.   [place], PT 09/17/2014, 1:23 PM  [facility] [address] [place], [place], [address] Phone: [phone]   Fax:  [phone]

## 2014-09-19 ENCOUNTER — Ambulatory Visit: Payer: PRIVATE HEALTH INSURANCE | Admitting: Rehabilitative and Restorative Service Providers"

## 2014-09-19 DIAGNOSIS — R269 Unspecified abnormalities of gait and mobility: Secondary | ICD-10-CM | POA: Diagnosis not present

## 2014-09-19 DIAGNOSIS — M6281 Muscle weakness (generalized): Secondary | ICD-10-CM

## 2014-09-19 NOTE — Therapy (Signed)
[facility] [address] [place], [place], [address] Phone: [phone]   Fax:  [phone]  Physical Therapy Treatment  Patient Details  Name: Richard Bridges MRN: [id number] Date of Birth: [birth year] Referring Provider:  Terese Door, MD  Encounter Date: 09/19/2014      PT End of Session - 09/19/14 1358    Visit Number 29   Number of Visits 32   Date for PT Re-Evaluation 09/29/14   Authorization Type $35 copay, no visit limit   PT Start Time 1145   PT Stop Time 1230   PT Time Calculation (min) 45 min   Equipment Utilized During Treatment Gait belt   Activity Tolerance Patient tolerated treatment well   Behavior During Therapy [facility] for tasks assessed/performed      Past Medical History  Diagnosis Date  . Fractures involving multiple body regions 04/14/2014    s/p multi-trauma with pelvic and spinal fractures    Past Surgical History  Procedure Laterality Date  . Spine surgery  04/15/2014    laminectomy s/p multi-trauma  . 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  Visit Diagnosis:  Abnormality of gait  Generalized muscle weakness      Subjective Assessment - 09/19/14 1357    Subjective The patient did not walk at home yet, but plans to before next PT visit.   Patient is accompained by: Family member   Patient Stated Goals Getting back to walking.   Currently in Pain? No/denies      THERAPEUTIC EXERCISE: Standing knee control activities initially stepping up to 4" block x 5 reps R and 5 reps left with tactile cues on hip over knee before extending knee with bilateral UE support and CGA for safety, Then added step downs anteriorly off of 4" step with R and L sides x 5 reps each  Standing heel cord stretch with tactile cues and min A Sidestepping at outside of parallel bars with UE support x 3 reps each direction  NEUROMUSCULAR RE-EDUCATION: Wall  bumps working on midline control and balance in the Ant/Post plane Standing without UE support increasing weight through LEs  Gait: Ambulation x 75 feet with cues on posture and knee flexion to initiate swing phase.       PT Short Term Goals - 09/04/14 1414    PT SHORT TERM GOAL #1   Title The patient will perform HEP progression with assist from family as needed.   Baseline Met on 09/04/2014 with standing and stretching currently main components of HEP.   Time 4   Period Weeks   Status Achieved   PT SHORT TERM GOAL #2   Title The patient will move sit<>stand to RW with close supervision (requires CGA to min A 6/22)   Baseline Pt can move w/c>stand at sink modified indep at home.  He can do with supervision with occasional CGA in the clinic on 09/04/2014   Time 4   Period Weeks   Status Partially Met   PT SHORT TERM GOAL #3   Title The patient will ambulate with RW and min A x 75 feet (25 ft on 6/22/).   Baseline Patient ambulated CGA with RW x 75 feet on 08/20/2014   Status Achieved   PT SHORT TERM GOAL #4   Title The patient will have orthotic needs for ankle control met.   Baseline Met, awaiting AFO and air cast for R LE,  may assess further for L ankle needs once ambulating more in home.   Time 4   Period Weeks   Status Achieved   PT SHORT TERM GOAL #5   Title The patient will maintain standing x 5 minutes for improved tolerance to LE weight bearing with SBA.  pt is now standing 2 x 3 minutes    Baseline Met on 08/06/2014 with patient increasing standing time when therapist encouraged > standing time   Time 4   Period Weeks   Status Achieved           PT Long Term Goals - 09/17/14 1320    PT LONG TERM GOAL #1   Title The patient will ambulate with RW x 200 ft with supervision for household ambulation.   Baseline Modified target date to 09/29/2014 (on 6/22, patient ambulates 25 feet with min A of 1 person with RW)   Time 8   Period Weeks   Status Revised   PT LONG  TERM GOAL #2   Title The patient will perform standing activities x 10 minutes nonstop for return to ADL activities.   Baseline Modified target date to 09/29/2014 (on 6/22, patient/family report standing x 3-4 minutes 1x/day)   Time 8   Period Weeks   Status Revised   PT LONG TERM GOAL #3   Title The patient will transfer sit<>stand to RW or countertop modified indep.   Baseline Met on 09/17/2014   Time 8   Period Weeks   Status Achieved   PT LONG TERM GOAL #4   Title The patient will have L LE strength 3/5 and R LE strength 2/5 (for hip flexion, bilat knee flexion/ext).    Baseline Target date 08/04/2014- see MMT in notes 08/01/2014   Time 8   Period Weeks   Status Achieved               Plan - 09/19/14 1359    Clinical Impression Statement The patient is working on knee control in order to progress to stair negotiation and therefore access his bathroom at home.  PT focusing on improving functional mobility in the home to allow improved ADL performance and allow increased walking/standing in the home for improved motor control.   PT Next Visit Plan Stair training, gait training progressing distance ? treadmill for reciprocal pattern?, standing weight shifting and knee control for stairs.  Cert due 02/27/8675 *add stair goal, gait with loftstrands? goal, level community gait goal   Consulted and Agree with Plan of Care Patient;Family member/caregiver   Family Member Consulted sister        Problem List There are no active problems to display for this patient.   [place], [place] 09/19/2014, 2:01 PM  [facility] [address] [place], [place], [address] Phone: [phone]   Fax:  [phone]

## 2014-09-24 ENCOUNTER — Ambulatory Visit: Payer: PRIVATE HEALTH INSURANCE | Admitting: Rehabilitative and Restorative Service Providers"

## 2014-09-24 DIAGNOSIS — M6281 Muscle weakness (generalized): Secondary | ICD-10-CM

## 2014-09-24 DIAGNOSIS — R269 Unspecified abnormalities of gait and mobility: Secondary | ICD-10-CM | POA: Diagnosis not present

## 2014-09-24 NOTE — Therapy (Signed)
[facility] [address] [place], [place], [address] Phone: [phone]   Fax:  [phone]  Physical Therapy Treatment  Patient Details  Name: Richard Bridges MRN: [id number] Date of Birth: Oct 15, 1975 Referring Provider:  Terese Door, MD  Encounter Date: 09/24/2014      PT End of Session - 09/24/14 1238    Visit Number 30   Number of Visits 32   Date for PT Re-Evaluation 09/29/14   Authorization Type $35 copay, no visit limit   PT Start Time 1145   PT Stop Time 1235   PT Time Calculation (min) 50 min   Equipment Utilized During Treatment Gait belt   Activity Tolerance Patient tolerated treatment well   Behavior During Therapy [facility] for tasks assessed/performed      Past Medical History  Diagnosis Date  . Fractures involving multiple body regions 04/14/2014    s/p multi-trauma with pelvic and spinal fractures    Past Surgical History  Procedure Laterality Date  . Spine surgery  04/15/2014    laminectomy s/p multi-trauma  . 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  Visit Diagnosis:  Abnormality of gait  Generalized muscle weakness      Subjective Assessment - 09/24/14 1238    Subjective Patient walked at home over weekend and is feeling like he can do 2 laps around living room/kitchen with RW and supervision well.   Patient is accompained by: Family member   Patient Stated Goals Getting back to walking.   Currently in Pain? No/denies      Gait: Ambulation x 230 feet with RW with close supervision with spasms increasing when fatigued, gait x 50 feet x 3 reps Ambulation in parallel bars working on 4 point and 2 point gait pattern in preparation for use of forearm crutches with demonstration, requiring CGA 10 feet x 4 reps Stair negotiation x 4 steps with min A guarding for L knee when descending steps for safety with step to pattern and bilateral  handrails  THERAPEUTIC EXERCISE: Supine hamstring stretching, hip adductor stretching, heel cord stretching P/ROM Thomas test hip flexor stretch      PT Short Term Goals - 09/04/14 1414    PT SHORT TERM GOAL #1   Title The patient will perform HEP progression with assist from family as needed.   Baseline Met on 09/04/2014 with standing and stretching currently main components of HEP.   Time 4   Period Weeks   Status Achieved   PT SHORT TERM GOAL #2   Title The patient will move sit<>stand to RW with close supervision (requires CGA to min A 6/22)   Baseline Pt can move w/c>stand at sink modified indep at home.  He can do with supervision with occasional CGA in the clinic on 09/04/2014   Time 4   Period Weeks   Status Partially Met   PT SHORT TERM GOAL #3   Title The patient will ambulate with RW and min A x 75 feet (25 ft on 6/22/).   Baseline Patient ambulated CGA with RW x 75 feet on 08/20/2014   Status Achieved   PT SHORT TERM GOAL #4   Title The patient will have orthotic needs for ankle control met.   Baseline Met, awaiting AFO and air cast for R LE, may assess further for L ankle needs once ambulating more in home.   Time 4   Period Weeks  Status Achieved   PT SHORT TERM GOAL #5   Title The patient will maintain standing x 5 minutes for improved tolerance to LE weight bearing with SBA.  pt is now standing 2 x 3 minutes    Baseline Met on 08/06/2014 with patient increasing standing time when therapist encouraged > standing time   Time 4   Period Weeks   Status Achieved           PT Long Term Goals - 09/24/14 1204    PT LONG TERM GOAL #1   Title The patient will ambulate with RW x 200 ft with supervision for household ambulation.   Baseline Met on 09/24/2014-- patient performed 230 feet with RW and close supervision.   Time 8   Period Weeks   Status Achieved   PT LONG TERM GOAL #2   Title The patient will perform standing activities x 10 minutes nonstop for return  to ADL activities.   Baseline Patient reports he can stand x 10 minutes in his home, he typically does 6 minutes 2-3 times/day, but can tolerate up to 10 minutes.   Time 8   Period Weeks   Status Achieved   PT LONG TERM GOAL #3   Title The patient will transfer sit<>stand to RW or countertop modified indep.   Baseline Met on 09/17/2014   Time 8   Period Weeks   Status Achieved   PT LONG TERM GOAL #4   Title The patient will have L LE strength 3/5 and R LE strength 2/5 (for hip flexion, bilat knee flexion/ext).    Baseline Target date 08/04/2014- see MMT in notes 08/01/2014   Time 8   Period Weeks   Status Achieved               Plan - 09/24/14 2249    Clinical Impression Statement The patient has met LTGs.  PT to update new LTGs next visit and establish plan for further progression of functional mobility.   PT Next Visit Plan Update STGs and LTGs x 8 more weeks; send note to MD re: tone management. Progress to gait with loftstrands, f/u on aquatics at [facility].   Consulted and Agree with Plan of Care Patient;Family member/caregiver   Family Member Consulted brother-in-law        Problem List There are no active problems to display for this patient.   [place], [place] 09/24/2014, 10:53 PM  [facility] [address] [place], [place], [address] Phone: [phone]   Fax:  [phone]

## 2014-09-27 ENCOUNTER — Ambulatory Visit: Payer: PRIVATE HEALTH INSURANCE | Admitting: Physical Therapy

## 2014-09-27 ENCOUNTER — Encounter: Payer: Self-pay | Admitting: Physical Therapy

## 2014-09-27 DIAGNOSIS — Z7409 Other reduced mobility: Secondary | ICD-10-CM

## 2014-09-27 DIAGNOSIS — M6281 Muscle weakness (generalized): Secondary | ICD-10-CM

## 2014-09-27 DIAGNOSIS — R5381 Other malaise: Secondary | ICD-10-CM

## 2014-09-27 DIAGNOSIS — R269 Unspecified abnormalities of gait and mobility: Secondary | ICD-10-CM | POA: Diagnosis not present

## 2014-09-28 ENCOUNTER — Encounter: Payer: Self-pay | Admitting: Rehabilitative and Restorative Service Providers"

## 2014-09-28 DIAGNOSIS — R269 Unspecified abnormalities of gait and mobility: Secondary | ICD-10-CM

## 2014-09-28 DIAGNOSIS — M6281 Muscle weakness (generalized): Secondary | ICD-10-CM

## 2014-09-28 NOTE — Therapy (Signed)
[facility] [address] [place], [place], [address] Phone: [phone]   Fax:  [phone]  Patient Details  Name: Richard Bridges MRN: [id number] Date of Birth: 1975/04/28 Referring Provider:  No ref. provider found  SHORT AND LONG TERM GOAL UPDATE FOR RECERTIFICATION IN PHYSICAL THERAPY:  Encounter Date: last encounter date 09/27/2014  Current status: The patient is now ambulating short distances in his home with RW (with R toe off brace + air cast and L air cast donned) with close supervision.  He is transferring w/c<>car without sliding board independently.  In therapy, we are beginning to transition from use of RW to bilateral forearm crutches for improved postural upright and a more reciprocal gait pattern (done in parallel bars and x 5 feet).  Patient has also recently started stair training with the goal to be able to access shower on 2nd level of home.  The patient is demonstrating continued improvements in strength.  Positive prognostic indicators: 1) Patient motivation 2) Supportive family to assist with carryover to home environment 3) Good overall health  Barriers to progress: 1) Muscle tone *significant in bilateral LEs in gastrocnemius and soleous worse with prolonged standing 2) Muscle spasms 3) Decreased ankle DF ROM 4) Occasional back pain usually when patient is in a more extended/upright position  Would patient be appropriate for further tone management (oral or botox injections)?       PT Short Term Goals - 09/28/14 1037    PT SHORT TERM GOAL #1   Title The patient will ambulate household distances modified indep x 75 feet for improved independence with mobility.   Baseline Target date 10/28/2014   Time 4   Period Weeks   PT SHORT TERM GOAL #2   Title The patient will ambulate x 400 feet nonstop on level surfaces with least restrictive device and supervision for return to limited community gait  (walking into appts/restaurant, etc).   Baseline Target date 10/28/2014   Time 4   Period Weeks   PT SHORT TERM GOAL #3   Title The patient will negotiate 4 steps x 4 reps (due to 2 level home) with CGA to min A and step to pattern using one handrail in order to access second floorof home.   Baseline Target date 10/28/2014   Time 4   Period Weeks   PT SHORT TERM GOAL #4   Title The patient will ambulate with min A using forearm crutches to begin transitioning from walker to less restrictive device x 50 feet.    Baseline Target date 10/28/2014   Time 4   Period Weeks   PT SHORT TERM GOAL #5   Title The patient will tolerate >10 minutes of standing exercise/activity in order to improve ability to do ADLs in standing for greater weight bearing in home environment.   Baseline Target date 10/28/2014   Time 4   Period Weeks   PT SHORT TERM GOAL #6   Title The patient will increase gait speed from 0.59 ft/sec up to 1.0 ft/sec to demo improved household mobility.   Baseline Target date 10/28/2014   Time 4   Period Weeks         PT Long Term Goals - 09/28/14 1041    PT LONG TERM GOAL #1   Title The patient will negotiate curbs/ramps with least restrictive device and supervision for return to limited community mobility.   Baseline Target date 11/27/2014   Time 8   Period Weeks  PT LONG TERM GOAL #2   Title The patient will verbalize understanding of community exercise routine for continued strengthening/endurance post d/c from PT   Baseline Target date 11/27/2014   Time 8   Period Weeks   PT LONG TERM GOAL #3   Title The patient will ambulate with least restrictive device modified indep for short community distances to improve independence for community appts/activities > 500 feet.   Baseline Target date 11/27/2014   Time 8   Period Weeks   PT LONG TERM GOAL #4   Title The patient will negotiate 16 steps (4 steps x 4 reps in clinic) with one handrail modified indep to improve access to  home environment/bathroom.   Baseline Target date 11/27/2014   Time 8   Period Weeks   PT LONG TERM GOAL #5   Title The patient will move floor<>stand with UE support modified indep.   Baseline Target date 11/27/2014   Time 8   Period Weeks   Additional Long Term Goals   Additional Long Term Goals Yes   PT LONG TERM GOAL #6   Title The patient will increase gait speed from 0.59 ft/sec to > or equal to 1.3 ft/sec to demo transition to "limited community ambulator" classification of gait.   Baseline Target date 11/27/2014   Time 8   Period Weeks         Plan - 09/28/14 1428    Clinical Impression Statement The patient met prior LTGs and is continuing to make functional gains in PT.  PT to continue to increase independence with household ambulation and progress to limited community mobility to patient tolerance.     Pt will benefit from skilled therapeutic intervention in order to improve on the following deficits Abnormal gait;Decreased balance;Decreased mobility;Decreased activity tolerance;Decreased range of motion;Difficulty walking;Impaired flexibility;Impaired sensation;Impaired tone;Increased muscle spasms;Decreased strength;Pain   Rehab Potential Good   PT Frequency 2x / week   PT Duration 8 weeks   PT Treatment/Interventions ADLs/Self Care Home Management;Electrical Stimulation;Functional mobility training;Therapeutic activities;Patient/family education;Passive range of motion;Wheelchair mobility training;Therapeutic exercise;DME Instruction;Gait training;Balance training;Neuromuscular re-education;Stair training;Aquatic Therapy;Orthotic Fit/Training   PT Next Visit Plan Continue to work on gait with crutches in parallel bars, progress gait distannce with walker, lower leg/core strengthening.   Recommended Other Services PT checking into aquatic PT @ [facility] to instruct patient in HEP for post d/c with family assistance.   Consulted and Agree with Plan of Care Patient        Thank you for the referral of this patient. Rudell Cobb, MPT   [place] 09/28/2014, 2:30 PM  [facility] [address] [place], [place], [address] Phone: [phone]   Fax:  [phone]

## 2014-09-28 NOTE — Therapy (Signed)
[facility] [address] [place], [place], [address] Phone: [phone]   Fax:  [phone]  Physical Therapy Treatment  Patient Details  Name: Richard Bridges MRN: [id number] Date of Birth: [birth year] Referring Provider:  Terese Door, MD  Encounter Date: 09/27/2014      PT End of Session - 09/27/14 1105    Visit Number 31   Number of Visits 32   Date for PT Re-Evaluation 09/29/14   Authorization Type $35 copay, no visit limit   PT Start Time 1103   PT Stop Time 1143   PT Time Calculation (min) 40 min   Equipment Utilized During Treatment Gait belt   Activity Tolerance Patient tolerated treatment well   Behavior During Therapy [facility] for tasks assessed/performed      Past Medical History  Diagnosis Date  . Fractures involving multiple body regions 04/14/2014    s/p multi-trauma with pelvic and spinal fractures    Past Surgical History  Procedure Laterality Date  . Spine surgery  04/15/2014    laminectomy s/p multi-trauma  . 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  Visit Diagnosis:  Abnormality of gait  Generalized muscle weakness  Physical deconditioning  Decreased functional mobility and endurance      Subjective Assessment - 09/27/14 1105    Subjective Walking at home is going good. No pain or falls to report.   Currently in Pain? No/denies   Pain Score 0-No pain           [facility] Adult PT Treatment/Exercise - 09/27/14 1115    Ambulation/Gait   Ambulation/Gait Yes   Ambulation/Gait Assistance 4: Min guard   Ambulation/Gait Assistance Details increased assist needed with crutches vs walker. cues on sequence with crutches and in parallel bars.Loss of balance with gait with both crutches,needed 2 person mod assist to catch and sit safely into chair.                  Ambulation Distance (Feet) 90 Feet  x1 walker;5 feet loftstrands;4 laps parallel  bars   Assistive device Rolling walker;Lofstrands;Parallel bars  bil orthotics   Gait Pattern Step-through pattern;Decreased stride length;Ataxic;Narrow base of support;Poor foot clearance - left   Ambulation Surface Level;Indoor   Gait velocity 55.34 sec-= 0.59 ft/sec with walker            PT Short Term Goals - 09/28/14 1037    PT SHORT TERM GOAL #1   Title The patient will ambulate household distances modified indep x 75 feet for improved independence with mobility.   Baseline Target date 10/28/2014   Time 4   Period Weeks   PT SHORT TERM GOAL #2   Title The patient will ambulate x 400 feet nonstop on level surfaces with least restrictive device and supervision for return to limited community gait (walking into appts/restaurant, etc).   Baseline Target date 10/28/2014   Time 4   Period Weeks   PT SHORT TERM GOAL #3   Title The patient will negotiate 4 steps x 4 reps (due to 2 level home) with CGA to min A and step to pattern using one handrail in order to access second floorof home.   Baseline Target date 10/28/2014   Time 4   Period Weeks   PT SHORT TERM GOAL #4   Title The patient will ambulate with min A using forearm crutches to begin transitioning from walker to less  restrictive device x 50 feet.    Baseline Target date 10/28/2014   Time 4   Period Weeks   PT SHORT TERM GOAL #5   Title The patient will tolerate >10 minutes of standing exercise/activity in order to improve ability to do ADLs in standing for greater weight bearing in home environment.   Baseline Target date 10/28/2014   Time 4   Period Weeks           PT Long Term Goals - 09/28/14 1041    PT LONG TERM GOAL #1   Title The patient will negotiate curbs/ramps with least restrictive device and supervision for return to limited community mobility.   Baseline Target date 11/27/2014   Time 8   Period Weeks   PT LONG TERM GOAL #2   Title The patient will verbalize understanding of community exercise  routine for continued strengthening/endurance post d/c from PT   Baseline Target date 11/27/2014   Time 8   Period Weeks   PT LONG TERM GOAL #3   Title The patient will ambulate with least restrictive device modified indep for short community distances to improve independence for community appts/activities > 500 feet.   Baseline Target date 11/27/2014   Time 8   Period Weeks   PT LONG TERM GOAL #4   Title The patient will negotiate 16 steps (4 steps x 4 reps in clinic) with one handrail modified indep to improve access to home environment/bathroom.   Baseline Target date 11/27/2014   Time 8   Period Weeks   PT LONG TERM GOAL #5   Title The patient will move floor<>stand with UE support modified indep.   Baseline Target date 11/27/2014   Time 8   Period Weeks           Plan - 09/27/14 1106    Clinical Impression Statement Pt with one episode of left ankle instability with use of forearm crutches today with him reporting increased ankle pain. Once in parallel bars to work on gait pattern with bars and bar/crutch combination no further issues were reported. Primary PT to complete renewal.   Pt will benefit from skilled therapeutic intervention in order to improve on the following deficits Abnormal gait;Decreased balance;Decreased mobility;Decreased activity tolerance;Decreased range of motion;Difficulty walking;Impaired flexibility;Impaired sensation;Impaired tone;Increased muscle spasms;Decreased strength;Pain   Rehab Potential Good   PT Frequency 2x / week   PT Duration 8 weeks   PT Treatment/Interventions ADLs/Self Care Home Management;Electrical Stimulation;Functional mobility training;Therapeutic activities;Patient/family education;Passive range of motion;Wheelchair mobility training;Therapeutic exercise;DME Instruction;Gait training;Balance training;Neuromuscular re-education;Stair training   PT Next Visit Plan Continue to work on gait with crutches in parallel bars, progress gait  distannce with walker, lower leg/core strengthening.   PT Home Exercise Plan Goals for next certificaton period (stairs for home, gait speed, gait distance, increasing indep in home)   Consulted and Agree with Plan of Care Patient;Family member/caregiver   Family Member Consulted brother-in-law        Problem List There are no active problems to display for this patient.   Willow Ora 09/28/2014, 12:30 PM  Willow Ora, PTA, [facility] [address], [place], [place] [address] [phone] 09/28/2014, 12:30 PM

## 2014-10-01 ENCOUNTER — Ambulatory Visit: Payer: PRIVATE HEALTH INSURANCE | Admitting: Physical Therapy

## 2014-10-01 ENCOUNTER — Encounter: Payer: Self-pay | Admitting: Physical Therapy

## 2014-10-01 DIAGNOSIS — R269 Unspecified abnormalities of gait and mobility: Secondary | ICD-10-CM

## 2014-10-01 DIAGNOSIS — Z7409 Other reduced mobility: Secondary | ICD-10-CM

## 2014-10-01 DIAGNOSIS — R5381 Other malaise: Secondary | ICD-10-CM

## 2014-10-01 DIAGNOSIS — M6281 Muscle weakness (generalized): Secondary | ICD-10-CM
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
## 2014-10-03 ENCOUNTER — Encounter: Payer: Self-pay | Admitting: *Deleted

## 2014-10-03 ENCOUNTER — Encounter: Payer: Self-pay | Admitting: Physical Therapy

## 2014-10-03 ENCOUNTER — Ambulatory Visit: Payer: PRIVATE HEALTH INSURANCE | Admitting: Physical Therapy

## 2014-10-03 ENCOUNTER — Ambulatory Visit: Payer: PRIVATE HEALTH INSURANCE | Admitting: *Deleted

## 2014-10-03 DIAGNOSIS — M6281 Muscle weakness (generalized): Secondary | ICD-10-CM

## 2014-10-03 DIAGNOSIS — R269 Unspecified abnormalities of gait and mobility: Secondary | ICD-10-CM | POA: Diagnosis not present

## 2014-10-03 DIAGNOSIS — Z7409 Other reduced mobility: Secondary | ICD-10-CM

## 2014-10-03 DIAGNOSIS — R5381 Other malaise: Secondary | ICD-10-CM

## 2014-10-03 NOTE — Patient Instructions (Signed)
Discussed pt goals and POC for out-pt OT. Pt would generally wish to increase independence with daily/functional activities in standing and eventually be able to transfer w/ walker into restaurants and possibly upstairs of his sisters house so he can transfer into tub/shower.
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
## 2014-10-08 ENCOUNTER — Ambulatory Visit: Payer: PRIVATE HEALTH INSURANCE | Admitting: Rehabilitative and Restorative Service Providers"

## 2014-10-08 ENCOUNTER — Ambulatory Visit: Payer: PRIVATE HEALTH INSURANCE | Admitting: Occupational Therapy

## 2014-10-08 ENCOUNTER — Encounter: Payer: Self-pay | Admitting: Occupational Therapy

## 2014-10-08 DIAGNOSIS — Z7409 Other reduced mobility: Secondary | ICD-10-CM

## 2014-10-08 DIAGNOSIS — R269 Unspecified abnormalities of gait and mobility: Secondary | ICD-10-CM | POA: Diagnosis not present

## 2014-10-08 DIAGNOSIS — Z789 Other specified health status: Secondary | ICD-10-CM

## 2014-10-08 DIAGNOSIS — M6281 Muscle weakness (generalized): Secondary | ICD-10-CM

## 2014-10-08 NOTE — Therapy (Signed)
[facility] [address] [place], [place], [address] Phone: [phone]   Fax:  [phone]  Occupational Therapy Treatment  Patient Details  Name: Richard Bridges MRN: [id number] Date of Birth: 1975/09/06 Referring Provider:  Terese Door, MD  Encounter Date: 10/08/2014      OT End of Session - 10/08/14 1405    Visit Number 2   Number of Visits 17   Date for OT Re-Evaluation 11/28/14   Authorization Type [employer]   OT Start Time 1100   OT Stop Time 1140   OT Time Calculation (min) 40 min   Equipment Utilized During Treatment GAIT BELT   Activity Tolerance Patient tolerated treatment well      Past Medical History  Diagnosis Date  . Fractures involving multiple body regions 04/14/2014    s/p multi-trauma with pelvic and spinal fractures    Past Surgical History  Procedure Laterality Date  . Spine surgery  04/15/2014    laminectomy s/p multi-trauma  . 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  Visit Diagnosis:  Impaired mobility and ADLs  Decreased functional mobility and endurance      Subjective Assessment - 10/08/14 1104    Patient is accompained by: Family member  sister   Pertinent History multi-trauma and T11-12 Laminectomy from accident on 04/14/14, spleenectomy   Patient Stated Goals Pt reports "that Christina from PT set [place] up to come back to OT, b/c I'm standing longer"     Currently in Pain? No/denies                      OT Treatments/Exercises (OP) - 10/08/14 0001    ADLs   Functional Mobility Dynamic standing to fold clothes: 1st trial approx 4 min. w/o rest, LOB x 3 and increased muscle spasms. 2nd trial: unable d/t muscle spasms. 3rd and 4th trials: 2-3 min. w/o rest to fold towels and LOB posteriorly. Pt required multiple rest breaks d/t fatigue and muscle spasms   Neurological Re-education Exercises   Other Exercises 1  Standing with back to wall: worked on anterior pelvic tilts and hip extension secondary pt standing in posterior pelvic tilt and hip flexion.                   OT Short Term Goals - 10/08/14 1407    OT SHORT TERM GOAL #1   Title I updated HEP for static/dynamic standing tasks at sink level (DUE 10/31/14)   Time 4   Period Weeks   Status On-going   OT SHORT TERM GOAL #2   Title Pt will perform simulated tub transfer using necessary DME w/ Mod I - distant supervision.   Time 4   Period Weeks   Status Achieved           OT Long Term Goals - 10/03/14 1118    OT LONG TERM GOAL #1   Title Pt to perform grooming activities in standing at sink for 10 minutes w/o rest (due 11/28/14)   Time 8   Period Weeks   Status New   OT LONG TERM GOAL #2   Title Pt to stand consistently to retrieve objects out of cabinets and stand at stove for cooking tasks using w/c only as needed and countertop support   Time 8   Period Weeks   Status New   OT LONG TERM GOAL #3   Title  Pt to perform laundry tasks mod I level in standing   Time 8   Period Weeks   Status New   OT LONG TERM GOAL #4   Title Pt will demonstrate dynamic standing balance consistently to place/retrieve items to/from dishwasher using countertop as support PRN.   Time 8   Period Weeks   Status New               Plan - 10/08/14 1405    Clinical Impression Statement Pt with decreased dynamic standing endurance and limited by muscle spasms. Pt requires one hand counter support to prevent LOB posteriorly. Pt met STG #2 per pt/sister report and did not wish to practice further   Plan standing to groom, tall kneeling exercises   Consulted and Agree with Plan of Care Patient;Family member/caregiver   Family Member Consulted Sister        Problem List There are no active problems to display for this patient.   Carey Bullocks, OTR/L 10/08/2014, 2:08 PM  [facility] [address] [place], [place], [address] Phone: [phone]   Fax:  [phone]

## 2014-10-08 NOTE — Therapy (Signed)
[facility] [address] [place], [place], [address] Phone: [phone]   Fax:  [phone]  Physical Therapy Treatment  Patient Details  Name: Richard Bridges MRN: [id number] Date of Birth: January 03, 1976 Referring Provider:  Terese Door, MD  Encounter Date: 10/08/2014      PT End of Session - 10/08/14 1446    Visit Number 34   Number of Visits 48   Date for PT Re-Evaluation 09/29/14   Authorization Type $35 copay, no visit limit   PT Start Time 1150   PT Stop Time 1233   PT Time Calculation (min) 43 min   Equipment Utilized During Treatment Gait belt   Activity Tolerance Patient tolerated treatment well   Behavior During Therapy [facility] for tasks assessed/performed      Past Medical History  Diagnosis Date  . Fractures involving multiple body regions 04/14/2014    s/p multi-trauma with pelvic and spinal fractures    Past Surgical History  Procedure Laterality Date  . Spine surgery  04/15/2014    laminectomy s/p multi-trauma  . 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  Visit Diagnosis:  Abnormality of gait  Generalized muscle weakness      Subjective Assessment - 10/08/14 1444    Subjective The patient scooted on his bottom up the stairs to access the shower.  He has ambulated x 3 laps at home nonstop (which is approximately 115 feet per sister).  Pt sees MD tomorrow (Dr. Bella Kennedy) for rehab follow-up.   Patient Stated Goals Getting back to walking.   Currently in Pain? No/denies      Gait: Ambulation x 115 feet with RW with increased muscle spasms noted today with SBA to CGA Gait x 60 feet from mat to parallel bars with RW with CGA due to spasms  NEUROMUSCULAR RE-EDUCATION: Sidestepping in the parallel bars with CGA for safety and cues on using hip/knee sensation to determine leg placement to avoid having to bend to look at feet Standing in parallel bars  with cues on posture   THERAPEUTIC EXERCISE: Seated sci-fit x 7 minutes with LEs only at level 2 with increased muscle spasms noted       PT Short Term Goals - 09/28/14 1037    PT SHORT TERM GOAL #1   Title The patient will ambulate household distances modified indep x 75 feet for improved independence with mobility.   Baseline Target date 10/28/2014   Time 4   Period Weeks   PT SHORT TERM GOAL #2   Title The patient will ambulate x 400 feet nonstop on level surfaces with least restrictive device and supervision for return to limited community gait (walking into appts/restaurant, etc).   Baseline Target date 10/28/2014   Time 4   Period Weeks   PT SHORT TERM GOAL #3   Title The patient will negotiate 4 steps x 4 reps (due to 2 level home) with CGA to min A and step to pattern using one handrail in order to access second floorof home.   Baseline Target date 10/28/2014   Time 4   Period Weeks   PT SHORT TERM GOAL #4   Title The patient will ambulate with min A using forearm crutches to begin transitioning from walker to less restrictive device x 50 feet.    Baseline Target date 10/28/2014   Time 4   Period Weeks   PT SHORT TERM GOAL #5  Title The patient will tolerate >10 minutes of standing exercise/activity in order to improve ability to do ADLs in standing for greater weight bearing in home environment.   Baseline Target date 10/28/2014   Time 4   Period Weeks   PT SHORT TERM GOAL #6   Title The patient will increase gait speed from 0.59 ft/sec up to 1.0 ft/sec to demo improved household mobility.   Baseline Target date 10/28/2014   Time 4   Period Weeks           PT Long Term Goals - 09/28/14 1041    PT LONG TERM GOAL #1   Title The patient will negotiate curbs/ramps with least restrictive device and supervision for return to limited community mobility.   Baseline Target date 11/27/2014   Time 8   Period Weeks   PT LONG TERM GOAL #2   Title The patient will verbalize  understanding of community exercise routine for continued strengthening/endurance post d/c from PT   Baseline Target date 11/27/2014   Time 8   Period Weeks   PT LONG TERM GOAL #3   Title The patient will ambulate with least restrictive device modified indep for short community distances to improve independence for community appts/activities > 500 feet.   Baseline Target date 11/27/2014   Time 8   Period Weeks   PT LONG TERM GOAL #4   Title The patient will negotiate 16 steps (4 steps x 4 reps in clinic) with one handrail modified indep to improve access to home environment/bathroom.   Baseline Target date 11/27/2014   Time 8   Period Weeks   PT LONG TERM GOAL #5   Title The patient will move floor<>stand with UE support modified indep.   Baseline Target date 11/27/2014   Time 8   Period Weeks   Additional Long Term Goals   Additional Long Term Goals Yes   PT LONG TERM GOAL #6   Title The patient will increase gait speed from 0.59 ft/sec to > or equal to 1.3 ft/sec to demo transition to "limited community ambulator" classification of gait.   Baseline Target date 11/27/2014   Time 8   Period Weeks               Plan - 10/08/14 1447    Clinical Impression Statement The patient is progressing general mobility now reporting walking short distance into bar on Friday.  The patient felt greater fatigue today after returning to OT and already performing OT session today.  He has increased spasms and fatigue.   PT Next Visit Plan standing balance, heel cord stretch, hip posture, gait with RW outdoors, continue crutches in parallel bars, LE/core strength.   Consulted and Agree with Plan of Care Patient;Family member/caregiver   Family Member Consulted sister        Problem List There are no active problems to display for this patient.   [place], [place] 10/08/2014, 2:49 PM  [facility] [address] [place], [place], [address] Phone: [phone]   Fax:  [phone]

## 2014-10-10 ENCOUNTER — Ambulatory Visit: Payer: PRIVATE HEALTH INSURANCE | Admitting: Rehabilitative and Restorative Service Providers"

## 2014-10-10 DIAGNOSIS — R269 Unspecified abnormalities of gait and mobility: Secondary | ICD-10-CM

## 2014-10-10 DIAGNOSIS — M6281 Muscle weakness (generalized): Secondary | ICD-10-CM

## 2014-10-10 NOTE — Therapy (Signed)
[facility] [address] [place], [place], [address] Phone: [phone]   Fax:  [phone]  Physical Therapy Treatment  Patient Details  Name: Richard Bridges MRN: [id number] Date of Birth: 12-14-1975 Referring Provider:  Terese Door, MD  Encounter Date: 10/10/2014      PT End of Session - 10/10/14 1612    Visit Number 35   Number of Visits 48   Date for PT Re-Evaluation 09/29/14   Authorization Type $35 copay, no visit limit   PT Start Time 1150   PT Stop Time 1240   PT Time Calculation (min) 50 min   Equipment Utilized During Treatment Gait belt   Activity Tolerance Patient tolerated treatment well   Behavior During Therapy [facility] for tasks assessed/performed      Past Medical History  Diagnosis Date  . Fractures involving multiple body regions 04/14/2014    s/p multi-trauma with pelvic and spinal fractures    Past Surgical History  Procedure Laterality Date  . Spine surgery  04/15/2014    laminectomy s/p multi-trauma  . 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  Visit Diagnosis:  Generalized muscle weakness  Abnormality of gait      Subjective Assessment - 10/10/14 1154    Subjective The patient saw Dr. Bella Kennedy yesterday and he increased baclofen and added one exercise for hip stretching/strengthening to improve posture.   Patient is accompained by: Family member   Patient Stated Goals Getting back to walking.   Currently in Pain? No/denies      THERAPEUTIC EXERCISE: Thomas test stretch for bilateral hip flexors Prone knee flexion x 5 reps R and 10 reps L Prone hip extension x 5 reps R and 10 reps L Combined prone knee flexion + hip extension requires assist  Sidelying resisted D1/D2 PNF pattern Sidelying hip abduction with cues on form (avoiding flexion) Quadriped with LE flexed towards contralateral UE for core and then hip extension, hip hiking  R/L sides x 5 reps, hip abduction with knees flexed R/L x 5 reps each side Standing with emphasis on lowering heels to the floor with tactile cues and instruction for home       PT Education - 10/10/14 1612    Education provided Yes   Education Details HEP: hip flexor stretch, standing emphasizing heels on ground for heel cord stretch, prone knee flexion   Person(s) Educated Patient;Caregiver(s)   Methods Explanation;Demonstration;Handout   Comprehension Verbalized understanding;Returned demonstration          PT Short Term Goals - 09/28/14 1037    PT SHORT TERM GOAL #1   Title The patient will ambulate household distances modified indep x 75 feet for improved independence with mobility.   Baseline Target date 10/28/2014   Time 4   Period Weeks   PT SHORT TERM GOAL #2   Title The patient will ambulate x 400 feet nonstop on level surfaces with least restrictive device and supervision for return to limited community gait (walking into appts/restaurant, etc).   Baseline Target date 10/28/2014   Time 4   Period Weeks   PT SHORT TERM GOAL #3   Title The patient will negotiate 4 steps x 4 reps (due to 2 level home) with CGA to min A and step to pattern using one handrail in order to access second floorof home.   Baseline Target date 10/28/2014   Time 4   Period [place]  PT SHORT TERM GOAL #4   Title The patient will ambulate with min A using forearm crutches to begin transitioning from walker to less restrictive device x 50 feet.    Baseline Target date 10/28/2014   Time 4   Period Weeks   PT SHORT TERM GOAL #5   Title The patient will tolerate >10 minutes of standing exercise/activity in order to improve ability to do ADLs in standing for greater weight bearing in home environment.   Baseline Target date 10/28/2014   Time 4   Period Weeks   PT SHORT TERM GOAL #6   Title The patient will increase gait speed from 0.59 ft/sec up to 1.0 ft/sec to demo improved household mobility.    Baseline Target date 10/28/2014   Time 4   Period Weeks           PT Long Term Goals - 09/28/14 1041    PT LONG TERM GOAL #1   Title The patient will negotiate curbs/ramps with least restrictive device and supervision for return to limited community mobility.   Baseline Target date 11/27/2014   Time 8   Period Weeks   PT LONG TERM GOAL #2   Title The patient will verbalize understanding of community exercise routine for continued strengthening/endurance post d/c from PT   Baseline Target date 11/27/2014   Time 8   Period Weeks   PT LONG TERM GOAL #3   Title The patient will ambulate with least restrictive device modified indep for short community distances to improve independence for community appts/activities > 500 feet.   Baseline Target date 11/27/2014   Time 8   Period Weeks   PT LONG TERM GOAL #4   Title The patient will negotiate 16 steps (4 steps x 4 reps in clinic) with one handrail modified indep to improve access to home environment/bathroom.   Baseline Target date 11/27/2014   Time 8   Period Weeks   PT LONG TERM GOAL #5   Title The patient will move floor<>stand with UE support modified indep.   Baseline Target date 11/27/2014   Time 8   Period Weeks   Additional Long Term Goals   Additional Long Term Goals Yes   PT LONG TERM GOAL #6   Title The patient will increase gait speed from 0.59 ft/sec to > or equal to 1.3 ft/sec to demo transition to "limited community ambulator" classification of gait.   Baseline Target date 11/27/2014   Time 8   Period Weeks               Plan - 10/10/14 1614    Clinical Impression Statement Focused treatment today on isolated motor control and stretching needed to improve standing posture and position to progress ambulation further.  Patient fatigues with isolated activities and PT emphasized need to return to exercising in the home vs. ambulation as only form of activity.   PT Next Visit Plan standing balance, heel cord  stretch, hip posture, gait with RW outdoors, continue crutches in parallel bars, LE/core strength.   Consulted and Agree with Plan of Care Family member/caregiver;Patient   Family Member Consulted sister        Problem List There are no active problems to display for this patient.   [place], [place] 10/10/2014, 4:16 PM  [facility] [address] [place], [place], [address] Phone: [phone]   Fax:  [phone]

## 2014-10-10 NOTE — Patient Instructions (Addendum)
Hip Flexor Stretch   Lying on back near edge of bed, bend one leg, foot flat. Hang other leg over edge, relaxed, thigh resting entirely on bed for _2___ minutes. Repeat __3__ times. Do __1__ sessions per day. Advanced Exercise: Bend knee back keeping thigh in contact with bed.  http://gt2.exer.us/346   Copyright  VHI. All rights reserved.   STANDING HEEL CORD: Stand trying to get each heel towards the floor.  Monitor ankle position to avoid ankles rolling (can wear air cast).  Knee Flexion   Bend knee as far as possible. Do 5-10 times. Repeat with other leg. Repeat ____ times. Do ____ sessions per day.  http://gt2.exer.us/389   Copyright  VHI. All rights reserved.

## 2014-10-16 ENCOUNTER — Ambulatory Visit: Payer: PRIVATE HEALTH INSURANCE | Attending: Psychiatry | Admitting: Rehabilitative and Restorative Service Providers"

## 2014-10-16 DIAGNOSIS — R269 Unspecified abnormalities of gait and mobility: Secondary | ICD-10-CM | POA: Insufficient documentation

## 2014-10-16 DIAGNOSIS — Z658 Other specified problems related to psychosocial circumstances: Secondary | ICD-10-CM | POA: Diagnosis present

## 2014-10-16 DIAGNOSIS — R5381 Other malaise: Secondary | ICD-10-CM | POA: Insufficient documentation

## 2014-10-16 DIAGNOSIS — M6281 Muscle weakness (generalized): Secondary | ICD-10-CM | POA: Diagnosis not present

## 2014-10-16 DIAGNOSIS — Z7409 Other reduced mobility: Secondary | ICD-10-CM | POA: Diagnosis present

## 2014-10-16 NOTE — Therapy (Signed)
[facility] [address] [place], [place], [address] Phone: [phone]   Fax:  [phone]  Physical Therapy Treatment  Patient Details  Name: Richard Bridges MRN: [id number] Date of Birth: 10-07-1975 Referring Provider:  Terese Door, MD  Encounter Date: 10/16/2014      PT End of Session - 10/16/14 1250    Visit Number 36   Number of Visits 48   Date for PT Re-Evaluation 11/27/14   Authorization Type $35 copay, no visit limit   PT Start Time 1235   PT Stop Time 1320   PT Time Calculation (min) 45 min   Equipment Utilized During Treatment Gait belt   Activity Tolerance Patient tolerated treatment well   Behavior During Therapy [facility] for tasks assessed/performed      Past Medical History  Diagnosis Date  . Fractures involving multiple body regions 04/14/2014    s/p multi-trauma with pelvic and spinal fractures    Past Surgical History  Procedure Laterality Date  . Spine surgery  04/15/2014    laminectomy s/p multi-trauma  . 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  Visit Diagnosis:  Generalized muscle weakness  Abnormality of gait      Subjective Assessment - 10/16/14 1238    Subjective The patient was able to spend the night at a friends' home over the weekend and able to fish from dock.  He increased baclofen dose and notes a change of less spasms with oral medication adjustment.   Currently in Pain? No/denies      Gait : Ambulation x 115 feet with RW with CGA to supervision level (requiring more assist when fatigued or due to spasms) Ambulation x 20 feet with RW with supervision In parallel bars with 4 point alternating UE/LE pattern x 10 feet x 8 times Backwards walking in parallel bars with min A for L ankle control (to prevent inversion) x 3 times x 10 feet  NEUROMUSCULAR RE-EDUCATION: Wall bumps emphasizing ant/posterior weight shifting/balance  control with supervision to [place] Standing steady state balance working on posterior pelvic tilt for posture and core activation Standing in the parallel bars with feet in stride position for weight shifting to decrease UE use (decreased to 1 UE)  THERAPEUTIC EXERCISE: Standing heel cord stretch with tactile cues for maintain posterior heel on the ground requiring increased time due to spasms         PT Short Term Goals - 09/28/14 1037    PT SHORT TERM GOAL #1   Title The patient will ambulate household distances modified indep x 75 feet for improved independence with mobility.   Baseline Target date 10/28/2014   Time 4   Period Weeks   PT SHORT TERM GOAL #2   Title The patient will ambulate x 400 feet nonstop on level surfaces with least restrictive device and supervision for return to limited community gait (walking into appts/restaurant, etc).   Baseline Target date 10/28/2014   Time 4   Period Weeks   PT SHORT TERM GOAL #3   Title The patient will negotiate 4 steps x 4 reps (due to 2 level home) with CGA to min A and step to pattern using one handrail in order to access second floorof home.   Baseline Target date 10/28/2014   Time 4   Period Weeks   PT SHORT TERM GOAL #4   Title The patient will ambulate with min A using  forearm crutches to begin transitioning from walker to less restrictive device x 50 feet.    Baseline Target date 10/28/2014   Time 4   Period Weeks   PT SHORT TERM GOAL #5   Title The patient will tolerate >10 minutes of standing exercise/activity in order to improve ability to do ADLs in standing for greater weight bearing in home environment.   Baseline Target date 10/28/2014   Time 4   Period Weeks   PT SHORT TERM GOAL #6   Title The patient will increase gait speed from 0.59 ft/sec up to 1.0 ft/sec to demo improved household mobility.   Baseline Target date 10/28/2014   Time 4   Period Weeks           PT Long Term Goals - 09/28/14 1041    PT LONG  TERM GOAL #1   Title The patient will negotiate curbs/ramps with least restrictive device and supervision for return to limited community mobility.   Baseline Target date 11/27/2014   Time 8   Period Weeks   PT LONG TERM GOAL #2   Title The patient will verbalize understanding of community exercise routine for continued strengthening/endurance post d/c from PT   Baseline Target date 11/27/2014   Time 8   Period Weeks   PT LONG TERM GOAL #3   Title The patient will ambulate with least restrictive device modified indep for short community distances to improve independence for community appts/activities > 500 feet.   Baseline Target date 11/27/2014   Time 8   Period Weeks   PT LONG TERM GOAL #4   Title The patient will negotiate 16 steps (4 steps x 4 reps in clinic) with one handrail modified indep to improve access to home environment/bathroom.   Baseline Target date 11/27/2014   Time 8   Period Weeks   PT LONG TERM GOAL #5   Title The patient will move floor<>stand with UE support modified indep.   Baseline Target date 11/27/2014   Time 8   Period Weeks   Additional Long Term Goals   Additional Long Term Goals Yes   PT LONG TERM GOAL #6   Title The patient will increase gait speed from 0.59 ft/sec to > or equal to 1.3 ft/sec to demo transition to "limited community ambulator" classification of gait.   Baseline Target date 11/27/2014   Time 8   Period Weeks               Plan - 10/16/14 1519    Clinical Impression Statement The patient continues to rely on UE support when standing.  PT emphasizing improving postural alignment (activating core, bringing hips anteriorly) to improve standing balance for functional tasks.  The patient continues with muscle spasms worse when fatigued, however improvement noted with increased oral dose of baclofen.    PT Next Visit Plan standing balance, heel cord stretch, hip posture, gait with RW outdoors, continue crutches in parallel bars,  LE/core strength.   Consulted and Agree with Plan of Care Patient;Family member/caregiver   Family Member Consulted sister        Problem List There are no active problems to display for this patient.   [place], [place] 10/16/2014, 3:23 PM  [facility] [address] [place], [place], [address] Phone: [phone]   Fax:  [phone]

## 2014-10-18 ENCOUNTER — Ambulatory Visit: Payer: PRIVATE HEALTH INSURANCE | Admitting: Rehabilitative and Restorative Service Providers"

## 2014-10-18 ENCOUNTER — Ambulatory Visit: Payer: PRIVATE HEALTH INSURANCE | Admitting: Occupational Therapy

## 2014-10-18 DIAGNOSIS — M6281 Muscle weakness (generalized): Secondary | ICD-10-CM | POA: Diagnosis not present

## 2014-10-18 DIAGNOSIS — R269 Unspecified abnormalities of gait and mobility: Secondary | ICD-10-CM

## 2014-10-19 NOTE — Therapy (Signed)
[facility] [address] [place], [place], [address] Phone: [phone]   Fax:  [phone]  Physical Therapy Treatment  Patient Details  Name: Richard Bridges MRN: [id number] Date of Birth: 1975-11-26 Referring Provider:  Terese Door, MD  Encounter Date: 10/18/2014      PT End of Session - 10/19/14 0953    Visit Number 37   Number of Visits 48   Date for PT Re-Evaluation 11/27/14   Authorization Type $35 copay, no visit limit   PT Start Time 1318   PT Stop Time 1400   PT Time Calculation (min) 42 min   Equipment Utilized During Treatment Gait belt   Activity Tolerance Patient tolerated treatment well   Behavior During Therapy [facility] for tasks assessed/performed      Past Medical History  Diagnosis Date  . Fractures involving multiple body regions 04/14/2014    s/p multi-trauma with pelvic and spinal fractures    Past Surgical History  Procedure Laterality Date  . Spine surgery  04/15/2014    laminectomy s/p multi-trauma  . 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  Visit Diagnosis:  Generalized muscle weakness  Abnormality of gait      Subjective Assessment - 10/19/14 0948    Subjective The patient had a fall last night when stepping down from a curb with RW and family nearby.   Patient is accompained by: Family member   Patient Stated Goals Getting back to walking.   Currently in Pain? No/denies          [facility] Adult PT Treatment/Exercise - 10/19/14 0949    Ambulation/Gait   Ambulation/Gait Yes   Ambulation/Gait Assistance 4: Min guard;5: Supervision   Ambulation/Gait Assistance Details --  also used +2 to facilitate posterior tilt + L ankle position   Ambulation Distance (Feet) --  115 feet, 80 feet with facilitation   Assistive device Rolling walker   Gait Pattern Step-through pattern;Decreased stride length;Narrow base of support;Poor foot  clearance - left;Poor foot clearance - right;Ataxic   Ambulation Surface Level;Indoor   Gait Comments focusing on L heel strike and foot position with gait to widen base of support + posterior pelvic tilt attempting to decrease weight bearing through UEs to improve posture   Exercises   Exercises Knee/Hip;Other Exercises   Other Exercises  prone on elbow to plan with facilitation of ankles, quadriped to tall kneeling, heel sitting with foot stretch, prone knee flexion, prone hip extension standing heel cord stretch   Knee/Hip Exercises: Stretches   Passive Hamstring Stretch Both;2 reps  supine   Hip Flexor Stretch Both;2 reps;20 seconds  thomas test position   Other Knee/Hip Stretches hip adductor stretch circle sitting, long sitting for hamstring stretch             PT Short Term Goals - 09/28/14 1037    PT SHORT TERM GOAL #1   Title The patient will ambulate household distances modified indep x 75 feet for improved independence with mobility.   Baseline Target date 10/28/2014   Time 4   Period Weeks   PT SHORT TERM GOAL #2   Title The patient will ambulate x 400 feet nonstop on level surfaces with least restrictive device and supervision for return to limited community gait (walking into appts/restaurant, etc).   Baseline Target date 10/28/2014   Time 4   Period Weeks   PT SHORT TERM GOAL #3  Title The patient will negotiate 4 steps x 4 reps (due to 2 level home) with CGA to min A and step to pattern using one handrail in order to access second floorof home.   Baseline Target date 10/28/2014   Time 4   Period Weeks   PT SHORT TERM GOAL #4   Title The patient will ambulate with min A using forearm crutches to begin transitioning from walker to less restrictive device x 50 feet.    Baseline Target date 10/28/2014   Time 4   Period Weeks   PT SHORT TERM GOAL #5   Title The patient will tolerate >10 minutes of standing exercise/activity in order to improve ability to do ADLs in  standing for greater weight bearing in home environment.   Baseline Target date 10/28/2014   Time 4   Period Weeks   PT SHORT TERM GOAL #6   Title The patient will increase gait speed from 0.59 ft/sec up to 1.0 ft/sec to demo improved household mobility.   Baseline Target date 10/28/2014   Time 4   Period Weeks           PT Long Term Goals - 09/28/14 1041    PT LONG TERM GOAL #1   Title The patient will negotiate curbs/ramps with least restrictive device and supervision for return to limited community mobility.   Baseline Target date 11/27/2014   Time 8   Period Weeks   PT LONG TERM GOAL #2   Title The patient will verbalize understanding of community exercise routine for continued strengthening/endurance post d/c from PT   Baseline Target date 11/27/2014   Time 8   Period Weeks   PT LONG TERM GOAL #3   Title The patient will ambulate with least restrictive device modified indep for short community distances to improve independence for community appts/activities > 500 feet.   Baseline Target date 11/27/2014   Time 8   Period Weeks   PT LONG TERM GOAL #4   Title The patient will negotiate 16 steps (4 steps x 4 reps in clinic) with one handrail modified indep to improve access to home environment/bathroom.   Baseline Target date 11/27/2014   Time 8   Period Weeks   PT LONG TERM GOAL #5   Title The patient will move floor<>stand with UE support modified indep.   Baseline Target date 11/27/2014   Time 8   Period Weeks   Additional Long Term Goals   Additional Long Term Goals Yes   PT LONG TERM GOAL #6   Title The patient will increase gait speed from 0.59 ft/sec to > or equal to 1.3 ft/sec to demo transition to "limited community ambulator" classification of gait.   Baseline Target date 11/27/2014   Time 8   Period Weeks               Plan - 10/19/14 6378    Clinical Impression Statement The patient is fatigued today from trying exoskeleton at [facility] yesterday.  He continues to be limited by muscle tone, ankle tightness, hip flexor tightness, and general sensation/control of movement.  PT to progress to STGs and to patient tolerance.   PT Next Visit Plan Begin checking goals for STGs and progressing ambulation distance.  Work on community barriers, and stairs for improved function.   Consulted and Agree with Plan of Care Patient;Family member/caregiver        Problem List There are no active problems to display for this  patient.   [place], [place] 10/19/2014, 9:56 AM  [facility] [address] [place], [place], [address] Phone: [phone]   Fax:  [phone]

## 2014-10-23 ENCOUNTER — Ambulatory Visit: Payer: PRIVATE HEALTH INSURANCE | Admitting: Rehabilitative and Restorative Service Providers"

## 2014-10-23 ENCOUNTER — Ambulatory Visit: Payer: PRIVATE HEALTH INSURANCE | Admitting: Occupational Therapy

## 2014-10-23 DIAGNOSIS — M6281 Muscle weakness (generalized): Secondary | ICD-10-CM | POA: Diagnosis not present

## 2014-10-23 DIAGNOSIS — Z789 Other specified health status: Secondary | ICD-10-CM

## 2014-10-23 DIAGNOSIS — R269 Unspecified abnormalities of gait and mobility: Secondary | ICD-10-CM

## 2014-10-23 DIAGNOSIS — Z7409 Other reduced mobility: Secondary | ICD-10-CM

## 2014-10-23 NOTE — Therapy (Signed)
[facility] [address] [place], [place], [address] Phone: [phone]   Fax:  [phone]  Occupational Therapy Treatment  Patient Details  Name: Richard Bridges MRN: [id number] Date of Birth: August 22, 1975 Referring Provider:  Terese Door, MD  Encounter Date: 10/23/2014      OT End of Session - 10/23/14 1303    Visit Number 3   Number of Visits 17   Date for OT Re-Evaluation 11/28/14   Authorization Type [employer]   OT Start Time 1020   OT Stop Time 1100   OT Time Calculation (min) 40 min   Equipment Utilized During Treatment GAIT BELT   Activity Tolerance Patient tolerated treatment well   Behavior During Therapy Agitated      Past Medical History  Diagnosis Date  . Fractures involving multiple body regions 04/14/2014    s/p multi-trauma with pelvic and spinal fractures    Past Surgical History  Procedure Laterality Date  . Spine surgery  04/15/2014    laminectomy s/p multi-trauma  . 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  Visit Diagnosis:  Generalized muscle weakness  Impaired mobility and ADLs  Decreased functional mobility and endurance      Subjective Assessment - 10/23/14 1019    Subjective  The rod in my back prevents me from going further   Patient is accompained by: Family member  sister   Pertinent History multi-trauma and T11-12 Laminectomy from accident on 04/14/14, spleenectomy   Patient Stated Goals --   Currently in Pain? No/denies                      OT Treatments/Exercises (OP) - 10/23/14 0001    Neurological Re-education Exercises   Other Exercises 1 Tall kneeling: working on static and dynamic movements maintaining anterior pelvic tilt as able. Pt with decreased tolerance and frusteration with task secondary to difficulty/muscle weakness.    Other Exercises 2 Supine: bridging ex's with therapist assist to prevent  hip abduction and ankle rotation/supination. Seated with BUE's in sh. extension and ER, bridging off mat for pelvis lifts while therapist supports LE's                  OT Short Term Goals - 10/08/14 1407    OT SHORT TERM GOAL #1   Title I updated HEP for static/dynamic standing tasks at sink level (DUE 10/31/14)   Time 4   Period Weeks   Status On-going   OT SHORT TERM GOAL #2   Title Pt will perform simulated tub transfer using necessary DME w/ Mod I - distant supervision.   Time 4   Period Weeks   Status Achieved           OT Long Term Goals - 10/03/14 1118    OT LONG TERM GOAL #1   Title Pt to perform grooming activities in standing at sink for 10 minutes w/o rest (due 11/28/14)   Time 8   Period Weeks   Status New   OT LONG TERM GOAL #2   Title Pt to stand consistently to retrieve objects out of cabinets and stand at stove for cooking tasks using w/c only as needed and countertop support   Time 8   Period Weeks   Status New   OT LONG TERM GOAL #3   Title Pt to perform laundry tasks mod I level in standing  Time 8   Period Weeks   Status New   OT LONG TERM GOAL #4   Title Pt will demonstrate dynamic standing balance consistently to place/retrieve items to/from dishwasher using countertop as support PRN.   Time 8   Period Weeks   Status New               Plan - 10/23/14 1304    Clinical Impression Statement Pt with decrease endurance maintaining anterior pelvic tilt in tall kneeling in prep for dynamic standing. Pt also with frusteration during task which prevents full participation in therapy at times.    Clinical Impairments Affecting Rehab Potential frusteration level   Plan standing to groom, wall bumps, ? plank   Consulted and Agree with Plan of Care Patient;Family member/caregiver   Family Member Consulted Sister        Problem List There are no active problems to display for this patient.   Carey Bullocks, OTR/L 10/23/2014,  1:06 PM  [facility] [address] [place], [place], [address] Phone: [phone]   Fax:  [phone]

## 2014-10-24 NOTE — Therapy (Signed)
[facility] [address] [place], [place], [address] Phone: [phone]   Fax:  [phone]  Physical Therapy Treatment  Patient Details  Name: Richard Bridges MRN: [id number] Date of Birth: [birth year] Referring Provider:  Terese Door, MD  Encounter Date: 10/23/2014      PT End of Session - 10/23/14 1726    Visit Number 38   Number of Visits 48   Date for PT Re-Evaluation 11/27/14   Authorization Type $35 copay, no visit limit   PT Start Time 1228   PT Stop Time 1325   PT Time Calculation (min) 57 min   Equipment Utilized During Treatment Gait belt   Activity Tolerance Patient tolerated treatment well   Behavior During Therapy [facility] for tasks assessed/performed      Past Medical History  Diagnosis Date  . Fractures involving multiple body regions 04/14/2014    s/p multi-trauma with pelvic and spinal fractures    Past Surgical History  Procedure Laterality Date  . Spine surgery  04/15/2014    laminectomy s/p multi-trauma  . 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  Visit Diagnosis:  Abnormality of gait  Generalized muscle weakness      Subjective Assessment - 10/23/14 1219    Subjective The patient is walking on his own in the home with the RW.  He is walking daily.  He has increased baclofen up to full dose that was currently increased.   Patient Stated Goals Getting back to walking.   Currently in Pain? No/denies      Gait: Ambulation x 115 feet with RW with verbal cues on hip control/posture and tactile cues for anterior initiation of pelvis during stance phase. Ambulation x 40 feet x 4 reps with RW and cues due to increased spasms today (tried to internally rotate knees to quiet tone) Stair negotiation 4 steps one rail with L handrail and sidestepping strategy and then 4 steps with R handrail and sidestepping strategy.   Patient requires min A for  safety and cues to avoid knee recurvatum when in single limb stance and to slow eccentric contraction to prevent knee buckling when descending steps.   THERAPEUTIC EXERCISE: Supine P/ROM hamstrings, hip adductors, heel cords.   NEUROMUSCULAR RE-EDUCATION: Standing balance in the parallel bars with UE support today due to muscle spasms.  PT tried to use sheet to pull hips anteriorly, however patient feels knees will buckle in this position, therefore used min A for support.         PT Short Term Goals - 10/23/14 2121    PT SHORT TERM GOAL #1   Title The patient will ambulate household distances modified indep x 75 feet for improved independence with mobility.   Baseline Met on 10/23/2014.   Time 4   Period Weeks   Status Achieved   PT SHORT TERM GOAL #2   Title The patient will ambulate x 400 feet nonstop on level surfaces with least restrictive device and supervision for return to limited community gait (walking into appts/restaurant, etc).   Baseline Target date 10/28/2014   Time 4   Period Weeks   Status On-going   PT SHORT TERM GOAL #3   Title The patient will negotiate 4 steps x 4 reps (due to 2 level home) with CGA to min A and step to pattern using one handrail in order to access second floorof home.   Baseline Target  date 10/28/2014   Time 4   Period Weeks   Status On-going   PT SHORT TERM GOAL #4   Title The patient will ambulate with min A using forearm crutches to begin transitioning from walker to less restrictive device x 50 feet.    Baseline Target date 10/28/2014   Time 4   Period Weeks   Status On-going   PT SHORT TERM GOAL #5   Title The patient will tolerate >10 minutes of standing exercise/activity in order to improve ability to do ADLs in standing for greater weight bearing in home environment.   Baseline Target date 10/28/2014   Time 4   Period Weeks   Status On-going   PT SHORT TERM GOAL #6   Title The patient will increase gait speed from 0.59 ft/sec up  to 1.0 ft/sec to demo improved household mobility.   Baseline Target date 10/28/2014   Time 4   Period Weeks   Status On-going           PT Long Term Goals - 09/28/14 1041    PT LONG TERM GOAL #1   Title The patient will negotiate curbs/ramps with least restrictive device and supervision for return to limited community mobility.   Baseline Target date 11/27/2014   Time 8   Period Weeks   PT LONG TERM GOAL #2   Title The patient will verbalize understanding of community exercise routine for continued strengthening/endurance post d/c from PT   Baseline Target date 11/27/2014   Time 8   Period Weeks   PT LONG TERM GOAL #3   Title The patient will ambulate with least restrictive device modified indep for short community distances to improve independence for community appts/activities > 500 feet.   Baseline Target date 11/27/2014   Time 8   Period Weeks   PT LONG TERM GOAL #4   Title The patient will negotiate 16 steps (4 steps x 4 reps in clinic) with one handrail modified indep to improve access to home environment/bathroom.   Baseline Target date 11/27/2014   Time 8   Period Weeks   PT LONG TERM GOAL #5   Title The patient will move floor<>stand with UE support modified indep.   Baseline Target date 11/27/2014   Time 8   Period Weeks   Additional Long Term Goals   Additional Long Term Goals Yes   PT LONG TERM GOAL #6   Title The patient will increase gait speed from 0.59 ft/sec to > or equal to 1.3 ft/sec to demo transition to "limited community ambulator" classification of gait.   Baseline Target date 11/27/2014   Time 8   Period Weeks               Plan - 10/23/14 2122    Clinical Impression Statement The patient is limited on steps by knee control moving into recurvatum when in single limb stance to step up onto next step and moving in a quick/uncontrolled knee flexion when descending steps.  PT to focus on knee control to progress stair negotiation in home.   Also working towards improved standing posture for gait and balance activities.   PT Next Visit Plan Knee control for stairs (with mirror for patient to see posture, recurvatum and control flexion), stretching heel cord + hip flexors, check STGs next week.   Consulted and Agree with Plan of Care Patient;Family member/caregiver   Family Member Consulted sister        Problem List There are no  active problems to display for this patient.   [place], [place] 10/24/2014, 8:32 AM  [facility] [address] [place], [place], [address] Phone: [phone]   Fax:  [phone]

## 2014-10-25 ENCOUNTER — Ambulatory Visit: Payer: PRIVATE HEALTH INSURANCE | Admitting: Rehabilitative and Restorative Service Providers"

## 2014-10-25 ENCOUNTER — Encounter: Payer: PRIVATE HEALTH INSURANCE | Admitting: Occupational Therapy

## 2014-10-26 ENCOUNTER — Encounter: Payer: Self-pay | Admitting: Physical Therapy

## 2014-10-26 ENCOUNTER — Ambulatory Visit: Payer: PRIVATE HEALTH INSURANCE | Admitting: Physical Therapy

## 2014-10-26 DIAGNOSIS — M6281 Muscle weakness (generalized): Secondary | ICD-10-CM

## 2014-10-26 DIAGNOSIS — Z7409 Other reduced mobility: Secondary | ICD-10-CM

## 2014-10-26 DIAGNOSIS — R5381 Other malaise: Secondary | ICD-10-CM

## 2014-10-26 NOTE — Therapy (Signed)
[facility] [address] [place], [place], [address] Phone: [phone]   Fax:  [phone]  Physical Therapy Treatment  Patient Details  Name: Richard Bridges MRN: [id number] Date of Birth: [birth year] Referring Orlando Thalmann:  Terese Door, MD  Encounter Date: 10/26/2014   10/26/14 1320  PT Visits / Re-Eval  Visit Number 39  Number of Visits 48  Date for PT Re-Evaluation 11/27/14  Authorization  Authorization Type $35 copay, no visit limit  PT Time Calculation  PT Start Time 1317  PT Stop Time 1400  PT Time Calculation (min) 43 min  PT - End of Session  Equipment Utilized During Treatment Gait belt  Activity Tolerance Patient tolerated treatment well  Behavior During Therapy [facility] for tasks assessed/performed     Past Medical History  Diagnosis Date  . Fractures involving multiple body regions 04/14/2014    s/p multi-trauma with pelvic and spinal fractures    Past Surgical History  Procedure Laterality Date  . Spine surgery  04/15/2014    laminectomy s/p multi-trauma  . 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  Visit Diagnosis:   Generalized muscle weakness    Impaired mobility and ADLs    Decreased functional mobility and endurance     Physical deconditioning         Subjective Assessment - 10/26/14 1319    Subjective No new complaints. No pain or new falls to report.    Currently in Pain? No/denies   Pain Score 0-No pain     Treatment: Exercises: cues on and assist for correct ex form and technique Resisted heel slides both ways x 10 each leg Straight leg raises with 2# weights, 5 sec hold x 10 each leg Short arc quads with 2# wieghts, 5 sec hold x 10 each leg  Prone Hip glut kick backs x 10 each leg Hamstring curls with assist to control return/essentric quad motion x 10 each leg  In paralllel bars Step ups: with aerobic step- one foot on  step, lifting other foot up and then back down. Manual assist for knee control/cues to prevent recurvatum x 10 each leg Mini squats with emphasis on light to no UE support and full leg weight bearing, 2 sets of 5 reps.        PT Short Term Goals - 10/23/14 2121    PT SHORT TERM GOAL #1   Title The patient will ambulate household distances modified indep x 75 feet for improved independence with mobility.   Baseline Met on 10/23/2014.   Time 4   Period Weeks   Status Achieved   PT SHORT TERM GOAL #2   Title The patient will ambulate x 400 feet nonstop on level surfaces with least restrictive device and supervision for return to limited community gait (walking into appts/restaurant, etc).   Baseline Target date 10/28/2014   Time 4   Period Weeks   Status On-going   PT SHORT TERM GOAL #3   Title The patient will negotiate 4 steps x 4 reps (due to 2 level home) with CGA to min A and step to pattern using one handrail in order to access second floorof home.   Baseline Target date 10/28/2014   Time 4   Period Weeks   Status On-going   PT SHORT TERM GOAL #4   Title The patient will ambulate with min A using forearm crutches to begin transitioning from walker  to less restrictive device x 50 feet.    Baseline Target date 10/28/2014   Time 4   Period Weeks   Status On-going   PT SHORT TERM GOAL #5   Title The patient will tolerate >10 minutes of standing exercise/activity in order to improve ability to do ADLs in standing for greater weight bearing in home environment.   Baseline Target date 10/28/2014   Time 4   Period Weeks   Status On-going   PT SHORT TERM GOAL #6   Title The patient will increase gait speed from 0.59 ft/sec up to 1.0 ft/sec to demo improved household mobility.   Baseline Target date 10/28/2014   Time 4   Period Weeks   Status On-going           PT Long Term Goals - 09/28/14 1041    PT LONG TERM GOAL #1   Title The patient will negotiate curbs/ramps with least  restrictive device and supervision for return to limited community mobility.   Baseline Target date 11/27/2014   Time 8   Period Weeks   PT LONG TERM GOAL #2   Title The patient will verbalize understanding of community exercise routine for continued strengthening/endurance post d/c from PT   Baseline Target date 11/27/2014   Time 8   Period Weeks   PT LONG TERM GOAL #3   Title The patient will ambulate with least restrictive device modified indep for short community distances to improve independence for community appts/activities > 500 feet.   Baseline Target date 11/27/2014   Time 8   Period Weeks   PT LONG TERM GOAL #4   Title The patient will negotiate 16 steps (4 steps x 4 reps in clinic) with one handrail modified indep to improve access to home environment/bathroom.   Baseline Target date 11/27/2014   Time 8   Period Weeks   PT LONG TERM GOAL #5   Title The patient will move floor<>stand with UE support modified indep.   Baseline Target date 11/27/2014   Time 8   Period Weeks   Additional Long Term Goals   Additional Long Term Goals Yes   PT LONG TERM GOAL #6   Title The patient will increase gait speed from 0.59 ft/sec to > or equal to 1.3 ft/sec to demo transition to "limited community ambulator" classification of gait.   Baseline Target date 11/27/2014   Time 8   Period Weeks        10/26/14 1320  Plan  Clinical Impression Statement Focused on knee strengthening today with no issued reported. Pt making steady progress toward goals.  Pt will benefit from skilled therapeutic intervention in order to improve on the following deficits Abnormal gait;Decreased balance;Decreased mobility;Decreased activity tolerance;Decreased range of motion;Difficulty walking;Impaired flexibility;Impaired sensation;Impaired tone;Increased muscle spasms;Decreased strength;Pain  Rehab Potential Good  PT Frequency 2x / week  PT Duration 8 weeks  PT Treatment/Interventions ADLs/Self Care Home  Management;Electrical Stimulation;Functional mobility training;Therapeutic activities;Patient/family education;Passive range of motion;Wheelchair mobility training;Therapeutic exercise;DME Instruction;Gait training;Balance training;Neuromuscular re-education;Stair training;Aquatic Therapy;Orthotic Fit/Training  PT Next Visit Plan Gait with walker on outdoor surfaces, including ramps/curbs for community gait training.Continue to work on gait with crutches in parallel bars, progress gait distannce with walker, lower leg/core strengthening.  Consulted and Agree with Plan of Care Patient       Problem List There are no active problems to display for this patient.   Willow Ora 10/26/2014, 1:31 PM  Willow Ora, PTA, [facility] [address]  [address] [place], [place] [address] [phone] 10/27/2014, 10:52 PM

## 2014-10-30 ENCOUNTER — Encounter: Payer: Self-pay | Admitting: Physical Therapy

## 2014-10-30 ENCOUNTER — Ambulatory Visit: Payer: PRIVATE HEALTH INSURANCE | Admitting: Occupational Therapy

## 2014-10-30 ENCOUNTER — Ambulatory Visit: Payer: PRIVATE HEALTH INSURANCE | Admitting: Physical Therapy

## 2014-10-30 DIAGNOSIS — M6281 Muscle weakness (generalized): Secondary | ICD-10-CM

## 2014-10-30 DIAGNOSIS — Z7409 Other reduced mobility: Secondary | ICD-10-CM

## 2014-10-30 DIAGNOSIS — R5381 Other malaise: Secondary | ICD-10-CM

## 2014-10-30 DIAGNOSIS — Z789 Other specified health status: Secondary | ICD-10-CM

## 2014-10-30 NOTE — Therapy (Signed)
[facility] [address] [place], [place], [address] Phone: [phone]   Fax:  [phone]  Physical Therapy Treatment  Patient Details  Name: Richard Bridges MRN: [id number] Date of Birth: 03-Jan-1976 Referring Provider:  Terese Door, MD  Encounter Date: 10/30/2014      PT End of Session - 10/30/14 1245    Visit Number 40   Number of Visits 48   Date for PT Re-Evaluation 11/27/14   Authorization Type $35 copay, no visit limit   PT Start Time 1232   PT Stop Time 1315   PT Time Calculation (min) 43 min   Equipment Utilized During Treatment Gait belt   Activity Tolerance Patient tolerated treatment well   Behavior During Therapy [facility] for tasks assessed/performed      Past Medical History  Diagnosis Date  . Fractures involving multiple body regions 04/14/2014    s/p multi-trauma with pelvic and spinal fractures    Past Surgical History  Procedure Laterality Date  . Spine surgery  04/15/2014    laminectomy s/p multi-trauma  . 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  Visit Diagnosis:  Generalized muscle weakness  Impaired mobility and ADLs  Decreased functional mobility and endurance  Physical deconditioning      Subjective Assessment - 10/30/14 1234    Subjective Has open sores/rash all over due to increase in Baclofen causing reaction/iching. Pt scratching has caused multiple open areas. Md office notified, Dr. Bella Kennedy out and covering Md ordered to gradually reduce dosage back down to original dosage. Dr. Bella Kennedy to followup when he returns to the office. Taking benadryl for itching in meantime. Water therapy on hold due to open areas, now for mid October (after sister's surgery and recovery).                                                             Currently in Pain? No/denies   Pain Score 0-No pain     Treatment:  Exercises: Short arc quads 3#  weights, 5 sec hold x 10 reps each leg Straight leg raises with 3# weights, 5 sec hold 1-2 inches only from mat, x 10 reps each leg Seated long arc quads with 2# weights, 5 sec holds, x 10 reps each leg    In parallel bars: with bil UE support Mini squats x 10 reps "butt touch" to chair with immediate rebound up to standing x 10 reps, emphasis on leg work and decreased UE use Step ups using aerobic step x 5 reps each leg, manual stability for knee control and position        PT Short Term Goals - 10/23/14 2121    PT SHORT TERM GOAL #1   Title The patient will ambulate household distances modified indep x 75 feet for improved independence with mobility.   Baseline Met on 10/23/2014.   Time 4   Period Weeks   Status Achieved   PT SHORT TERM GOAL #2   Title The patient will ambulate x 400 feet nonstop on level surfaces with least restrictive device and supervision for return to limited community gait (walking into appts/restaurant, etc).   Baseline Target date 10/28/2014   Time 4   Period [place]  Status On-going   PT SHORT TERM GOAL #3   Title The patient will negotiate 4 steps x 4 reps (due to 2 level home) with CGA to min A and step to pattern using one handrail in order to access second floorof home.   Baseline Target date 10/28/2014   Time 4   Period Weeks   Status On-going   PT SHORT TERM GOAL #4   Title The patient will ambulate with min A using forearm crutches to begin transitioning from walker to less restrictive device x 50 feet.    Baseline Target date 10/28/2014   Time 4   Period Weeks   Status On-going   PT SHORT TERM GOAL #5   Title The patient will tolerate >10 minutes of standing exercise/activity in order to improve ability to do ADLs in standing for greater weight bearing in home environment.   Baseline Target date 10/28/2014   Time 4   Period Weeks   Status On-going   PT SHORT TERM GOAL #6   Title The patient will increase gait speed from 0.59 ft/sec up to 1.0  ft/sec to demo improved household mobility.   Baseline Target date 10/28/2014   Time 4   Period Weeks   Status On-going           PT Long Term Goals - 09/28/14 1041    PT LONG TERM GOAL #1   Title The patient will negotiate curbs/ramps with least restrictive device and supervision for return to limited community mobility.   Baseline Target date 11/27/2014   Time 8   Period Weeks   PT LONG TERM GOAL #2   Title The patient will verbalize understanding of community exercise routine for continued strengthening/endurance post d/c from PT   Baseline Target date 11/27/2014   Time 8   Period Weeks   PT LONG TERM GOAL #3   Title The patient will ambulate with least restrictive device modified indep for short community distances to improve independence for community appts/activities > 500 feet.   Baseline Target date 11/27/2014   Time 8   Period Weeks   PT LONG TERM GOAL #4   Title The patient will negotiate 16 steps (4 steps x 4 reps in clinic) with one handrail modified indep to improve access to home environment/bathroom.   Baseline Target date 11/27/2014   Time 8   Period Weeks   PT LONG TERM GOAL #5   Title The patient will move floor<>stand with UE support modified indep.   Baseline Target date 11/27/2014   Time 8   Period Weeks   Additional Long Term Goals   Additional Long Term Goals Yes   PT LONG TERM GOAL #6   Title The patient will increase gait speed from 0.59 ft/sec to > or equal to 1.3 ft/sec to demo transition to "limited community ambulator" classification of gait.   Baseline Target date 11/27/2014   Time 8   Period Weeks            Plan - 10/30/14 1246    Clinical Impression Statement Pt not starting with aquatic PT this week due to reaction to medication and now with open skin wounds. Will start in early October. Continued to work on knee strengthening and stability with with stading exercises. Making steady progress toward goals.   Pt will benefit from  skilled therapeutic intervention in order to improve on the following deficits Abnormal gait;Decreased balance;Decreased mobility;Decreased activity tolerance;Decreased range of motion;Difficulty walking;Impaired flexibility;Impaired sensation;Impaired tone;Increased muscle  spasms;Decreased strength;Pain   Rehab Potential Good   PT Frequency 2x / week   PT Duration 8 weeks   PT Treatment/Interventions ADLs/Self Care Home Management;Electrical Stimulation;Functional mobility training;Therapeutic activities;Patient/family education;Passive range of motion;Wheelchair mobility training;Therapeutic exercise;DME Instruction;Gait training;Balance training;Neuromuscular re-education;Stair training;Aquatic Therapy;Orthotic Fit/Training   PT Next Visit Plan Continue to work on knee strength, unsupported balance and gait/stairs toward goals. Assess STG's.   Consulted and Agree with Plan of Care Patient        Problem List There are no active problems to display for this patient.   Willow Ora 10/31/2014, 2:26 PM  Willow Ora, PTA, [facility] [address], [place], [place] [address] [phone] 10/31/2014, 2:26 PM

## 2014-10-30 NOTE — Therapy (Signed)
[facility] [address] [place], [place], [address] Phone: [phone]   Fax:  [phone]  Occupational Therapy Treatment  Patient Details  Name: Richard Bridges MRN: [id number] Date of Birth: Feb 27, 1975 Referring Provider:  Terese Door, MD  Encounter Date: 10/30/2014      OT End of Session - 10/30/14 1409    Visit Number 4   Number of Visits 17   Date for OT Re-Evaluation 12/03/14   Authorization Type [employer]   Authorization Time Period week 3/8   OT Start Time 1315   OT Stop Time 1355   OT Time Calculation (min) 40 min   Equipment Utilized During Treatment GAIT BELT   Activity Tolerance Patient tolerated treatment well      Past Medical History  Diagnosis Date  . Fractures involving multiple body regions 04/14/2014    s/p multi-trauma with pelvic and spinal fractures    Past Surgical History  Procedure Laterality Date  . Spine surgery  04/15/2014    laminectomy s/p multi-trauma  . 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  Visit Diagnosis:  Impaired mobility and ADLs  Decreased functional mobility and endurance  Generalized muscle weakness      Subjective Assessment - 10/30/14 1319    Subjective  I had an allergic reaction to the baclofen   Patient is accompained by: Family member   Pertinent History multi-trauma and T11-12 Laminectomy from accident on 04/14/14, spleenectomy   Patient Stated Goals Pt reports "that Christina from PT set [place] up to come back to OT, b/c I'm standing longer"     Currently in Pain? No/denies                      OT Treatments/Exercises (OP) - 10/30/14 0001    ADLs   Grooming Standing to groom at sink for over 5 minutes w/o rest.    Neurological Re-education Exercises   Other Exercises 1 Standing to perform wall bumps for anterior pelvic tilt, hip extension activation in prep for dynamic standing tasks. Pt  then standing to perform alternating overhead reaching, bilateral overhead reaching with multiple short rest breaks. Progressed to reaching outside BOS with one hand support and CGA for safety with gait belt.    Other Weight-Bearing Exercises 1 Plank x 5 reps holding 5 sec. each with max assist for ankle support                  OT Short Term Goals - 10/08/14 1407    OT SHORT TERM GOAL #1   Title I updated HEP for static/dynamic standing tasks at sink level (DUE 10/31/14)   Time 4   Period Weeks   Status On-going   OT SHORT TERM GOAL #2   Title Pt will perform simulated tub transfer using necessary DME w/ Mod I - distant supervision.   Time 4   Period Weeks   Status Achieved           OT Long Term Goals - 10/03/14 1118    OT LONG TERM GOAL #1   Title Pt to perform grooming activities in standing at sink for 10 minutes w/o rest (due 11/28/14)   Time 8   Period Weeks   Status New   OT LONG TERM GOAL #2   Title Pt to stand consistently to retrieve objects out of cabinets and stand at stove for cooking tasks  using w/c only as needed and countertop support   Time 8   Period Weeks   Status New   OT LONG TERM GOAL #3   Title Pt to perform laundry tasks mod I level in standing   Time 8   Period Weeks   Status New   OT LONG TERM GOAL #4   Title Pt will demonstrate dynamic standing balance consistently to place/retrieve items to/from dishwasher using countertop as support PRN.   Time 8   Period Weeks   Status New               Plan - 10/30/14 1410    Clinical Impression Statement Pt demo increased standing tolerance/endurance today. Pt also fully participatory during therapy session today.    Plan tall kneeling, standing to get things out of cabinets   Consulted and Agree with Plan of Care Patient;Family member/caregiver   Family Member Consulted Sister        Problem List There are no active problems to display for this patient.   Carey Bullocks, OTR/L 10/30/2014, 2:14 PM  [facility] [address] [place], [place], [address] Phone: [phone]   Fax:  [phone]

## 2014-11-01 ENCOUNTER — Ambulatory Visit: Payer: PRIVATE HEALTH INSURANCE | Admitting: Physical Therapy

## 2014-11-01 ENCOUNTER — Encounter: Payer: PRIVATE HEALTH INSURANCE | Admitting: Occupational Therapy

## 2014-11-01 ENCOUNTER — Encounter: Payer: Self-pay | Admitting: Physical Therapy

## 2014-11-01 ENCOUNTER — Ambulatory Visit: Payer: PRIVATE HEALTH INSURANCE | Admitting: Rehabilitative and Restorative Service Providers"

## 2014-11-01 ENCOUNTER — Ambulatory Visit: Payer: PRIVATE HEALTH INSURANCE | Admitting: Occupational Therapy

## 2014-11-01 DIAGNOSIS — R5381 Other malaise: Secondary | ICD-10-CM

## 2014-11-01 DIAGNOSIS — M6281 Muscle weakness (generalized): Secondary | ICD-10-CM

## 2014-11-01 DIAGNOSIS — Z7409 Other reduced mobility: Secondary | ICD-10-CM

## 2014-11-01 DIAGNOSIS — R269 Unspecified abnormalities of gait and mobility: Secondary | ICD-10-CM

## 2014-11-01 DIAGNOSIS — Z789 Other specified health status: Secondary | ICD-10-CM

## 2014-11-01 NOTE — Therapy (Signed)
[facility] [address] [place], [place], [address] Phone: [phone]   Fax:  [phone]  Occupational Therapy Treatment  Patient Details  Name: Richard Bridges MRN: [id number] Date of Birth: July 14, 1975 Referring Provider:  Terese Door, MD  Encounter Date: 11/01/2014      OT End of Session - 11/01/14 1154    Visit Number 5   Number of Visits 17   Date for OT Re-Evaluation 12/03/14   Authorization Type [employer]   Authorization Time Period week 3/8   OT Start Time 1105   OT Stop Time 1145   OT Time Calculation (min) 40 min   Equipment Utilized During Treatment GAIT BELT   Activity Tolerance Patient tolerated treatment well      Past Medical History  Diagnosis Date  . Fractures involving multiple body regions 04/14/2014    s/p multi-trauma with pelvic and spinal fractures    Past Surgical History  Procedure Laterality Date  . Spine surgery  04/15/2014    laminectomy s/p multi-trauma  . 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  Visit Diagnosis:  Impaired mobility and ADLs  Decreased functional mobility and endurance  Generalized muscle weakness      Subjective Assessment - 11/01/14 1144    Subjective  I've done some of this at home (re: getting things out of cabinets)   Patient is accompained by: Family member  sister   Pertinent History multi-trauma and T11-12 Laminectomy from accident on 04/14/14, spleenectomy   Currently in Pain? No/denies                      OT Treatments/Exercises (OP) - 11/01/14 1146    ADLs   Functional Mobility Standing to retrieve items out of cabinets with one hand support, and occasionally reaching BUE's w/o counter support. Then placing items to bilateral sides requiring quad control. Rest break required after task before reaching to bilateral sides to return to cabinet.    Shoulder Exercises:  ROM/Strengthening   UBE (Upper Arm Bike) UBE x 5 min. Level 8 (forwards/backwards)   Neurological Re-education Exercises   Other Exercises 1 Tall kneeling to short kneeling and back up for trunk/pelvic control and anterior pelvic tilt. Progressed to maintaining tall kneeling while perform dynamic bilateral UE ex's with several short rest breaks required.                   OT Short Term Goals - 10/08/14 1407    OT SHORT TERM GOAL #1   Title I updated HEP for static/dynamic standing tasks at sink level (DUE 10/31/14)   Time 4   Period Weeks   Status On-going   OT SHORT TERM GOAL #2   Title Pt will perform simulated tub transfer using necessary DME w/ Mod I - distant supervision.   Time 4   Period Weeks   Status Achieved           OT Long Term Goals - 10/03/14 1118    OT LONG TERM GOAL #1   Title Pt to perform grooming activities in standing at sink for 10 minutes w/o rest (due 11/28/14)   Time 8   Period Weeks   Status New   OT LONG TERM GOAL #2   Title Pt to stand consistently to retrieve objects out of cabinets and stand at stove for cooking tasks using w/c only as needed and countertop  support   Time 8   Period Weeks   Status New   OT LONG TERM GOAL #3   Title Pt to perform laundry tasks mod I level in standing   Time 8   Period Weeks   Status New   OT LONG TERM GOAL #4   Title Pt will demonstrate dynamic standing balance consistently to place/retrieve items to/from dishwasher using countertop as support PRN.   Time 8   Period Weeks   Status New               Plan - 11/01/14 1154    Clinical Impression Statement Pt with increased tolerance/endurance in tall kneeling and standing and fully participatory during therapy session today.    Plan continue pelvic control, standing tolerance   Consulted and Agree with Plan of Care Patient;Family member/caregiver   Family Member Consulted Sister        Problem List There are no active problems to  display for this patient.   Carey Bullocks, OTR/L 11/01/2014, 11:56 AM  [facility] [address] [place], [place], [address] Phone: [phone]   Fax:  [phone]

## 2014-11-02 NOTE — Therapy (Signed)
[facility] [address] [place], [place], [address] Phone: [phone]   Fax:  [phone]  Physical Therapy Treatment  Patient Details  Name: Richard Bridges MRN: [id number] Date of Birth: 02-24-1975 Referring Provider:  Terese Door, MD  Encounter Date: 11/01/2014   11/01/14 1020  PT Visits / Re-Eval  Visit Number 41  Number of Visits 48  Date for PT Re-Evaluation 11/27/14  Authorization  Authorization Type $35 copay, no visit limit  PT Time Calculation  PT Start Time 1016  PT Stop Time 1100  PT Time Calculation (min) 44 min  PT - End of Session  Equipment Utilized During Treatment Gait belt  Activity Tolerance Patient tolerated treatment well  Behavior During Therapy [facility] for tasks assessed/performed     Past Medical History  Diagnosis Date  . Fractures involving multiple body regions 04/14/2014    s/p multi-trauma with pelvic and spinal fractures    Past Surgical History  Procedure Laterality Date  . Spine surgery  04/15/2014    laminectomy s/p multi-trauma  . 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  Visit Diagnosis:  Decreased functional mobility and endurance  Generalized muscle weakness  Physical deconditioning  Abnormality of gait  Decreased independence with transfers   11/01/14 1020  Symptoms/Limitations  Subjective No new complaints. No falls or pain to report.  Pain Assessment  Currently in Pain? No/denies  Pain Score 0       11/01/14 1023  Transfers  Sit to Stand 5: Supervision;4: Min guard;With upper extremity assist;From bed;From chair/3-in-1  Stand to Sit 5: Supervision;4: Min guard;With upper extremity assist;To bed;To chair/3-in-1  Ambulation/Gait  Ambulation/Gait Yes  Ambulation/Gait Assistance 4: Min guard  Ambulation/Gait Assistance Details cues on posture and to correct gait deviations.   Ambulation Distance (Feet)  210 Feet  Assistive device Rolling walker  Gait Pattern Step-through pattern;Decreased stride length;Narrow base of support;Poor foot clearance - left;Poor foot clearance - right;Ataxic  Ambulation Surface Level;Indoor  Gait velocity 49.63 sec's= 0.67 ft/sec with walker  Stairs Yes  Stairs Assistance 4: Min guard;4: Min assist  Stairs Assistance Details (indicate cue type and reason) cues on posture, knee control and foot placement to allow room for other foot  Stair Management Technique One rail Right;One rail Left;Step to pattern;Sideways  Number of Stairs 4 (x3 reps)   Standing in parallel bars: 1-2 UE support with min guard assist and cues on posture and weight shifting. Alternating UE raises x 10 each side Alternating cross body reaching x 10 each side Step ups on 6 inch step x 10 each leg side stepping left<>right x 3 laps each way        PT Short Term Goals - 11/01/14 1021    PT SHORT TERM GOAL #1   Title The patient will ambulate household distances modified indep x 75 feet for improved independence with mobility.   Baseline Met on 10/23/2014.   Time 4   Period Weeks   Status Achieved   PT SHORT TERM GOAL #2   Title The patient will ambulate x 400 feet nonstop on level surfaces with least restrictive device and supervision for return to limited community gait (walking into appts/restaurant, etc).   Baseline 11/01/14: 210 feet before needed to sit and rest with walker and min guard assist   Time --   Period --   Status Not Met   PT SHORT TERM GOAL #3  Title The patient will negotiate 4 steps x 4 reps (due to 2 level home) with CGA to min A and step to pattern using one handrail in order to access second floorof home.   Baseline 11/01/14: pt performed 4 stairs x 3 reps as stated above with min assist to CGA   Time --   Period --   Status Partially Met   PT SHORT TERM GOAL #4   Title The patient will ambulate with min A using forearm crutches to begin transitioning from  walker to less restrictive device x 50 feet.    Baseline --   Time --   Period --   Status Deferred   PT SHORT TERM GOAL #5   Title The patient will tolerate >10 minutes of standing exercise/activity in order to improve ability to do ADLs in standing for greater weight bearing in home environment.   Baseline 11/01/14: able to stand >10 minutes in session while engaged in gait/standing activities   Time --   Period --   Status Achieved   PT SHORT TERM GOAL #6   Title The patient will increase gait speed from 0.59 ft/sec up to 1.0 ft/sec to demo improved household mobility.   Baseline --   Time --   Period --   Status Not Met           PT Long Term Goals - 09/28/14 1041    PT LONG TERM GOAL #1   Title The patient will negotiate curbs/ramps with least restrictive device and supervision for return to limited community mobility.   Baseline Target date 11/27/2014   Time 8   Period Weeks   PT LONG TERM GOAL #2   Title The patient will verbalize understanding of community exercise routine for continued strengthening/endurance post d/c from PT   Baseline Target date 11/27/2014   Time 8   Period Weeks   PT LONG TERM GOAL #3   Title The patient will ambulate with least restrictive device modified indep for short community distances to improve independence for community appts/activities > 500 feet.   Baseline Target date 11/27/2014   Time 8   Period Weeks   PT LONG TERM GOAL #4   Title The patient will negotiate 16 steps (4 steps x 4 reps in clinic) with one handrail modified indep to improve access to home environment/bathroom.   Baseline Target date 11/27/2014   Time 8   Period Weeks   PT LONG TERM GOAL #5   Title The patient will move floor<>stand with UE support modified indep.   Baseline Target date 11/27/2014   Time 8   Period Weeks   Additional Long Term Goals   Additional Long Term Goals Yes   PT LONG TERM GOAL #6   Title The patient will increase gait speed from 0.59  ft/sec to > or equal to 1.3 ft/sec to demo transition to "limited community ambulator" classification of gait.   Baseline Target date 11/27/2014   Time 8   Period Weeks        11/01/14 1021  Plan  Clinical Impression Statement Pt met 2/6 STG's today and partiall met 1 other goal. Pt is progressing toward all other goals. Pt did demo increased essentric knee control on stairs today.   Pt will benefit from skilled therapeutic intervention in order to improve on the following deficits Abnormal gait;Decreased balance;Decreased mobility;Decreased activity tolerance;Decreased range of motion;Difficulty walking;Impaired flexibility;Impaired sensation;Impaired tone;Increased muscle spasms;Decreased strength;Pain  Rehab Potential Good  PT Frequency  2x / week  PT Duration 8 weeks  PT Treatment/Interventions ADLs/Self Care Home Management;Electrical Stimulation;Functional mobility training;Therapeutic activities;Patient/family education;Passive range of motion;Wheelchair mobility training;Therapeutic exercise;DME Instruction;Gait training;Balance training;Neuromuscular re-education;Stair training;Aquatic Therapy;Orthotic Fit/Training  PT Next Visit Plan Continue to work on knee strength, unsupported balance and gait/stairs toward goals.  Consulted and Agree with Plan of Care Patient      Problem List There are no active problems to display for this patient.   Willow Ora 11/02/2014, 10:36 PM  Willow Ora, PTA, [facility] [address] [place], [place] [address] [phone] 11/02/2014, 10:42 PM

## 2014-11-06 ENCOUNTER — Ambulatory Visit: Payer: PRIVATE HEALTH INSURANCE | Admitting: Rehabilitative and Restorative Service Providers"

## 2014-11-06 ENCOUNTER — Ambulatory Visit: Payer: PRIVATE HEALTH INSURANCE | Admitting: Occupational Therapy

## 2014-11-06 DIAGNOSIS — M6281 Muscle weakness (generalized): Secondary | ICD-10-CM

## 2014-11-06 DIAGNOSIS — R269 Unspecified abnormalities of gait and mobility: Secondary | ICD-10-CM

## 2014-11-06 DIAGNOSIS — Z789 Other specified health status: Secondary | ICD-10-CM

## 2014-11-06 DIAGNOSIS — Z7409 Other reduced mobility: Secondary | ICD-10-CM

## 2014-11-06 NOTE — Therapy (Signed)
[facility] [address] [place], [place], [address] Phone: [phone]   Fax:  [phone]  Occupational Therapy Treatment  Patient Details  Name: Richard Bridges MRN: [id number] Date of Birth: 09/11/1975 Referring Provider:  Terese Door, MD  Encounter Date: 11/06/2014      OT End of Session - 11/06/14 1407    Visit Number 6   Number of Visits 17   Date for OT Re-Evaluation 12/03/14   Authorization Type [employer]   Authorization Time Period week 4/8   OT Start Time 1315   OT Stop Time 1400   OT Time Calculation (min) 45 min   Equipment Utilized During Treatment GAIT BELT   Activity Tolerance Patient tolerated treatment well      Past Medical History  Diagnosis Date  . Fractures involving multiple body regions 04/14/2014    s/p multi-trauma with pelvic and spinal fractures    Past Surgical History  Procedure Laterality Date  . Spine surgery  04/15/2014    laminectomy s/p multi-trauma  . 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  Visit Diagnosis:  Impaired mobility and ADLs  Decreased functional mobility and endurance  Generalized muscle weakness      Subjective Assessment - 11/06/14 1318    Patient is accompained by: Family member   Pertinent History multi-trauma and T11-12 Laminectomy from accident on 04/14/14, spleenectomy   Patient Stated Goals Pt reports "that Christina from PT set [place] up to come back to OT, b/c I'm standing longer"     Currently in Pain? No/denies                      OT Treatments/Exercises (OP) - 11/06/14 0001    ADLs   Functional Mobility Standing at countertop to fold clothes with cues/occasional assist for pelvis control. Pt also required one hand support on occasion d/t LOB, but could disengage BUE's inconsistently   Neurological Re-education Exercises   Other Exercises 1 Pelvic bridging seated and supine x 10  reps each   Other Exercises 2 Seated on physioball: focused on lower trunk and pelvic control with A-P pelvic tilts, side to side pelvic tilts, and pelvic circumduction as able. Pt rest more on Lt side. Progressed to disengaging LE's into marches (alternating) while on physioball with max difficulty. (Required mod assist x 2 to transition onto and off of physioball)                  OT Short Term Goals - 10/08/14 1407    OT SHORT TERM GOAL #1   Title I updated HEP for static/dynamic standing tasks at sink level (DUE 10/31/14)   Time 4   Period Weeks   Status On-going   OT SHORT TERM GOAL #2   Title Pt will perform simulated tub transfer using necessary DME w/ Mod I - distant supervision.   Time 4   Period Weeks   Status Achieved           OT Long Term Goals - 10/03/14 1118    OT LONG TERM GOAL #1   Title Pt to perform grooming activities in standing at sink for 10 minutes w/o rest (due 11/28/14)   Time 8   Period Weeks   Status New   OT LONG TERM GOAL #2   Title Pt to stand consistently to retrieve objects out of cabinets and stand at stove for  cooking tasks using w/c only as needed and countertop support   Time 8   Period Weeks   Status New   OT LONG TERM GOAL #3   Title Pt to perform laundry tasks mod I level in standing   Time 8   Period Weeks   Status New   OT LONG TERM GOAL #4   Title Pt will demonstrate dynamic standing balance consistently to place/retrieve items to/from dishwasher using countertop as support PRN.   Time 8   Period Weeks   Status New               Plan - 11/06/14 1408    Clinical Impression Statement Pt with decreased standing endurance today and mod difficulty with trunk/pelvic ex's on physioball.    Plan continue tall kneeling, standing tolerance   Consulted and Agree with Plan of Care Patient;Family member/caregiver   Family Member Consulted Brother-n-law        Problem List There are no active problems to display for  this patient.   Carey Bullocks, OTR/L 11/06/2014, 2:10 PM  [facility] [address] [place], [place], [address] Phone: [phone]   Fax:  [phone]

## 2014-11-07 NOTE — Therapy (Signed)
[facility] [address] [place], [place], [address] Phone: [phone]   Fax:  [phone]  Physical Therapy Treatment  Patient Details  Name: Richard Bridges MRN: [id number] Date of Birth: 1975/06/29 Referring Provider:  Terese Door, MD  Encounter Date: 11/06/2014      PT End of Session - 11/07/14 0855    Visit Number 42   Number of Visits 48   Date for PT Re-Evaluation 11/27/14   Authorization Type $35 copay, no visit limit   PT Start Time 1240   PT Stop Time 1320   PT Time Calculation (min) 40 min   Equipment Utilized During Treatment Gait belt   Activity Tolerance Patient tolerated treatment well   Behavior During Therapy [facility] for tasks assessed/performed      Past Medical History  Diagnosis Date  . Fractures involving multiple body regions 04/14/2014    s/p multi-trauma with pelvic and spinal fractures    Past Surgical History  Procedure Laterality Date  . Spine surgery  04/15/2014    laminectomy s/p multi-trauma  . 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  Visit Diagnosis:  Abnormality of gait  Generalized muscle weakness      Subjective Assessment - 11/06/14 1239    Subjective The patient doesn't feel like his muscle spasms have worsened since decreasing baclofen.      Gait: Pre-gait activities including standing with bilateral forearm crutches with sequencing practice moving R and then L UEs forward/backward with cane with hip weight shifting.  Attempted to begin to take L and R steps with 4 point forearm crutch pattern with +2 assist (1 person monitoring L ankle and 1 person proving CGA for balance). PT provided femoral IR cues to decrease ankle inversion posture. Gait with forearm crutches hindered today by muscle spasms and L ankle inversion position.   NEUROMUSCULAR RE-EDUCATION: Standing in parallel bars 1-2 UE support with CGA with  posterior pelvic tilt posture cueing Alternating UE raises x 10 each side for balance control Moving feet into/out of stride position with decreasing UE support to 1 UE with min A to CGA for safety Static balance holds decreasing UE support for standing balance STanding hip strategy training with cues to move hips over toes with tactile/target for visual facilitation of task with min A  THERAPEUTIC EXERCISE: Marcello Moores test stretch B LE  Standing gastrocs and heel cord stretch in parallel bars with UE support and tactile cues on keeping heel/foot in correct position        PT Short Term Goals - 11/01/14 1021    PT SHORT TERM GOAL #1   Title The patient will ambulate household distances modified indep x 75 feet for improved independence with mobility.   Baseline Met on 10/23/2014.   Time 4   Period Weeks   Status Achieved   PT SHORT TERM GOAL #2   Title The patient will ambulate x 400 feet nonstop on level surfaces with least restrictive device and supervision for return to limited community gait (walking into appts/restaurant, etc).   Baseline 11/01/14: 210 feet before needed to sit and rest with walker and min guard assist   Time --   Period --   Status Not Met   PT SHORT TERM GOAL #3   Title The patient will negotiate 4 steps x 4 reps (due to 2 level home) with CGA to min A and step to pattern using  one handrail in order to access second floorof home.   Baseline 11/01/14: pt performed 4 stairs x 3 reps as stated above with min assist to CGA   Time --   Period --   Status Partially Met   PT SHORT TERM GOAL #4   Title The patient will ambulate with min A using forearm crutches to begin transitioning from walker to less restrictive device x 50 feet.    Baseline --   Time --   Period --   Status Deferred   PT SHORT TERM GOAL #5   Title The patient will tolerate >10 minutes of standing exercise/activity in order to improve ability to do ADLs in standing for greater weight bearing in  home environment.   Baseline 11/01/14: able to stand >10 minutes in session while engaged in gait/standing activities   Time --   Period --   Status Achieved   PT SHORT TERM GOAL #6   Title The patient will increase gait speed from 0.59 ft/sec up to 1.0 ft/sec to demo improved household mobility.   Baseline --   Time --   Period --   Status Not Met           PT Long Term Goals - 09/28/14 1041    PT LONG TERM GOAL #1   Title The patient will negotiate curbs/ramps with least restrictive device and supervision for return to limited community mobility.   Baseline Target date 11/27/2014   Time 8   Period Weeks   PT LONG TERM GOAL #2   Title The patient will verbalize understanding of community exercise routine for continued strengthening/endurance post d/c from PT   Baseline Target date 11/27/2014   Time 8   Period Weeks   PT LONG TERM GOAL #3   Title The patient will ambulate with least restrictive device modified indep for short community distances to improve independence for community appts/activities > 500 feet.   Baseline Target date 11/27/2014   Time 8   Period Weeks   PT LONG TERM GOAL #4   Title The patient will negotiate 16 steps (4 steps x 4 reps in clinic) with one handrail modified indep to improve access to home environment/bathroom.   Baseline Target date 11/27/2014   Time 8   Period Weeks   PT LONG TERM GOAL #5   Title The patient will move floor<>stand with UE support modified indep.   Baseline Target date 11/27/2014   Time 8   Period Weeks   Additional Long Term Goals   Additional Long Term Goals Yes   PT LONG TERM GOAL #6   Title The patient will increase gait speed from 0.59 ft/sec to > or equal to 1.3 ft/sec to demo transition to "limited community ambulator" classification of gait.   Baseline Target date 11/27/2014   Time 8   Period Weeks               Plan - 11/07/14 1517    Clinical Impression Statement Patient has improved postural  control and anterior pelvic tilt in standing while in parallel bars. Patient experienced difficulty while standing and using forearm crutches because of muscle spasms. Patient to work on improved standing posture to improve posture during gait.    PT Next Visit Plan Continue to work on knee strength, unsupported balance and gait/stairs toward goals. Continue standing balance progression with forearm crutches and progress to 4 point gait pattern with L ankle control emphasis as able.   Consulted and  Agree with Plan of Care Patient;Family member/caregiver   Family Member Consulted brother-in-law        Problem List There are no active problems to display for this patient.   [place], [place] 11/07/2014, 8:58 AM  [facility] [address] [place], [place], [address] Phone: [phone]   Fax:  [phone]

## 2014-11-08 ENCOUNTER — Encounter: Payer: PRIVATE HEALTH INSURANCE | Admitting: Occupational Therapy

## 2014-11-08 ENCOUNTER — Ambulatory Visit: Payer: PRIVATE HEALTH INSURANCE | Admitting: Rehabilitative and Restorative Service Providers"

## 2014-11-09 ENCOUNTER — Ambulatory Visit: Payer: PRIVATE HEALTH INSURANCE | Admitting: Physical Therapy

## 2014-11-09 ENCOUNTER — Encounter: Payer: Self-pay | Admitting: Physical Therapy

## 2014-11-09 DIAGNOSIS — M6281 Muscle weakness (generalized): Secondary | ICD-10-CM

## 2014-11-09 DIAGNOSIS — R269 Unspecified abnormalities of gait and mobility: Secondary | ICD-10-CM

## 2014-11-09 DIAGNOSIS — R5381 Other malaise: Secondary | ICD-10-CM

## 2014-11-09 DIAGNOSIS — Z7409 Other reduced mobility: Secondary | ICD-10-CM

## 2014-11-09 NOTE — Therapy (Signed)
[facility] [address] [place], [place], [address] Phone: [phone]   Fax:  [phone]  Physical Therapy Treatment  Patient Details  Name: Richard Bridges MRN: [id number] Date of Birth: 17-Jan-1976 Referring Provider:  Terese Door, MD  Encounter Date: 11/09/2014      PT End of Session - 11/09/14 1237    Visit Number 43   Number of Visits 64   Date for PT Re-Evaluation 11/27/14   Authorization Type $35 copay, no visit limit   PT Start Time 1233   Equipment Utilized During Treatment Gait belt   Activity Tolerance Patient tolerated treatment well   Behavior During Therapy [facility] for tasks assessed/performed      Past Medical History  Diagnosis Date  . Fractures involving multiple body regions 04/14/2014    s/p multi-trauma with pelvic and spinal fractures    Past Surgical History  Procedure Laterality Date  . Spine surgery  04/15/2014    laminectomy s/p multi-trauma  . 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  Visit Diagnosis:  No diagnosis found.      Subjective Assessment - 11/09/14 1236    Subjective No new complaints. No falls or pain to report. Scheduled for 12/18/14 for botox consult.   Currently in Pain? No/denies   Pain Score 0-No pain     Treatment: Standing in parallel bars - alternating UE raises x 10 reps each side - bil UE raises x 10 reps - alternating cross body reaching x 10 reps each way - mini squats x 10 reps with emphasis on use of legs to return to standing, not UE's - step ups with 4 inch step x 10 each side, cues on decreased UE use  Pre-gait/gait: With forearm crutches alternating fwd/bwd stepping x 10-12 each side  2 laps in parallel bars with crutches with min assist and chair follow. 45 feet with bil forearm crutches, including 180 degree turn, with min to mod assist and chair follow. Min cues on sequence initially,  progressing to intermittent cues as pt got used to sequence (using 4 point gait pattern).         PT Short Term Goals - 11/01/14 1021    PT SHORT TERM GOAL #1   Title The patient will ambulate household distances modified indep x 75 feet for improved independence with mobility.   Baseline Met on 10/23/2014.   Time 4   Period Weeks   Status Achieved   PT SHORT TERM GOAL #2   Title The patient will ambulate x 400 feet nonstop on level surfaces with least restrictive device and supervision for return to limited community gait (walking into appts/restaurant, etc).   Baseline 11/01/14: 210 feet before needed to sit and rest with walker and min guard assist   Time --   Period --   Status Not Met   PT SHORT TERM GOAL #3   Title The patient will negotiate 4 steps x 4 reps (due to 2 level home) with CGA to min A and step to pattern using one handrail in order to access second floorof home.   Baseline 11/01/14: pt performed 4 stairs x 3 reps as stated above with min assist to CGA   Time --   Period --   Status Partially Met   PT SHORT TERM GOAL #4   Title The patient will ambulate with min A using forearm crutches to begin transitioning  from walker to less restrictive device x 50 feet.    Baseline --   Time --   Period --   Status Deferred   PT SHORT TERM GOAL #5   Title The patient will tolerate >10 minutes of standing exercise/activity in order to improve ability to do ADLs in standing for greater weight bearing in home environment.   Baseline 11/01/14: able to stand >10 minutes in session while engaged in gait/standing activities   Time --   Period --   Status Achieved   PT SHORT TERM GOAL #6   Title The patient will increase gait speed from 0.59 ft/sec up to 1.0 ft/sec to demo improved household mobility.   Baseline --   Time --   Period --   Status Not Met           PT Long Term Goals - 09/28/14 1041    PT LONG TERM GOAL #1   Title The patient will negotiate curbs/ramps  with least restrictive device and supervision for return to limited community mobility.   Baseline Target date 11/27/2014   Time 8   Period Weeks   PT LONG TERM GOAL #2   Title The patient will verbalize understanding of community exercise routine for continued strengthening/endurance post d/c from PT   Baseline Target date 11/27/2014   Time 8   Period Weeks   PT LONG TERM GOAL #3   Title The patient will ambulate with least restrictive device modified indep for short community distances to improve independence for community appts/activities > 500 feet.   Baseline Target date 11/27/2014   Time 8   Period Weeks   PT LONG TERM GOAL #4   Title The patient will negotiate 16 steps (4 steps x 4 reps in clinic) with one handrail modified indep to improve access to home environment/bathroom.   Baseline Target date 11/27/2014   Time 8   Period Weeks   PT LONG TERM GOAL #5   Title The patient will move floor<>stand with UE support modified indep.   Baseline Target date 11/27/2014   Time 8   Period Weeks   Additional Long Term Goals   Additional Long Term Goals Yes   PT LONG TERM GOAL #6   Title The patient will increase gait speed from 0.59 ft/sec to > or equal to 1.3 ft/sec to demo transition to "limited community ambulator" classification of gait.   Baseline Target date 11/27/2014   Time 8   Period Weeks           Plan - 11/09/14 1237    Pt will benefit from skilled therapeutic intervention in order to improve on the following deficits Abnormal gait;Decreased balance;Decreased mobility;Decreased activity tolerance;Decreased range of motion;Difficulty walking;Impaired flexibility;Impaired sensation;Impaired tone;Increased muscle spasms;Decreased strength;Pain   PT Frequency 2x / week   PT Duration 8 weeks   PT Treatment/Interventions ADLs/Self Care Home Management;Electrical Stimulation;Functional mobility training;Therapeutic activities;Patient/family education;Passive range of  motion;Wheelchair mobility training;Therapeutic exercise;DME Instruction;Gait training;Balance training;Neuromuscular re-education;Stair training;Aquatic Therapy;Orthotic Fit/Training   PT Next Visit Plan Continue to work on knee strength, unsupported balance and gait/stairs toward goals. Continue standing balance progression with forearm crutches and progress to 4 point gait pattern with L ankle control emphasis as able.   Consulted and Agree with Plan of Care Patient        Problem List There are no active problems to display for this patient.   Willow Ora 11/09/2014, 12:40 PM  Willow Ora, PTA, [place] [phone]  [facility], [place], [place] [phone] 11/09/2014, 10:45 PM

## 2014-11-12 ENCOUNTER — Telehealth: Payer: Self-pay | Admitting: Rehabilitative and Restorative Service Providers"

## 2014-11-12 NOTE — Telephone Encounter (Signed)
Spoke with [name] (Granite's brother).  She reports that he is continuing to itch with reduced baclofen and she has added benadryl to help with itching.  Patty tried to call Dr. Bella Kennedy for earlier appointment, but is not able to be seen until 12/18/2014.  She requested that PT contact Dr. Silvestre Mesi office to try to facilitate earlier appointment in order to help with spasticity and changing approaches due to intolerance to baclofen (with itching).

## 2014-11-13 ENCOUNTER — Ambulatory Visit: Payer: PRIVATE HEALTH INSURANCE | Admitting: Occupational Therapy

## 2014-11-13 ENCOUNTER — Ambulatory Visit: Payer: PRIVATE HEALTH INSURANCE | Attending: Psychiatry | Admitting: Rehabilitative and Restorative Service Providers"

## 2014-11-13 DIAGNOSIS — R5381 Other malaise: Secondary | ICD-10-CM | POA: Diagnosis present

## 2014-11-13 DIAGNOSIS — M6281 Muscle weakness (generalized): Secondary | ICD-10-CM | POA: Insufficient documentation

## 2014-11-13 DIAGNOSIS — Z7409 Other reduced mobility: Secondary | ICD-10-CM

## 2014-11-13 DIAGNOSIS — R269 Unspecified abnormalities of gait and mobility: Secondary | ICD-10-CM | POA: Insufficient documentation

## 2014-11-13 NOTE — Therapy (Signed)
[facility] [address] [place], [place], [address] Phone: [phone]   Fax:  [phone]  Physical Therapy Treatment  Patient Details  Name: Richard Bridges MRN: [id number] Date of Birth: May 16, 1975 Referring Provider:  Terese Door, MD  Encounter Date: 11/13/2014      PT End of Session - 11/13/14 2051    Visit Number 44   Number of Visits 48   Date for PT Re-Evaluation 11/27/14   Authorization Type $35 copay, no visit limit   PT Start Time 1106   PT Stop Time 1146   PT Time Calculation (min) 40 min   Equipment Utilized During Treatment Gait belt   Activity Tolerance Patient tolerated treatment well   Behavior During Therapy [facility] for tasks assessed/performed      Past Medical History  Diagnosis Date  . Fractures involving multiple body regions 04/14/2014    s/p multi-trauma with pelvic and spinal fractures    Past Surgical History  Procedure Laterality Date  . Spine surgery  04/15/2014    laminectomy s/p multi-trauma  . 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  Visit Diagnosis:  Abnormality of gait  Generalized muscle weakness      Subjective Assessment - 11/13/14 1110    Subjective The patient reports mild itching and is back to his baseline level of baclofen.  He does not see the purpose of using forearm crutches reporting he will not be willing to use them unless he can feel his feet.  PT explained current purpose is to help improve posture with standing, standing balance, and work towards 4 point gait pattern>2 point gait pattern only if safe and patient able to tolerate.   Currently in Pain? No/denies      NEUROMUSCULAR RE-EDUCATION: Performed postural control emphasizing posterior pelvic tilt for upright posture in parallel bars. Tactile cues were given to correct posture. Added head turns while in staggered stance.   THERAPEUTIC  EXERCISE: Squats in parallel bars x5 min assist  Stretching: hamstrings, hip flexors through [employer], child's pose, heel cord stretch, prone quad, cat/cow   Prone straight leg raises 3x each side  Quadruped hip abduction with CGA  Gait: Gait performed in parallel bars 20 ft min assist  Treadmill 4 min (with one standing rest break) with tactile cues to initiate heel strike of R ankle and correct L ankle inversion             PT Short Term Goals - 11/01/14 1021    PT SHORT TERM GOAL #1   Title The patient will ambulate household distances modified indep x 75 feet for improved independence with mobility.   Baseline Met on 10/23/2014.   Time 4   Period Weeks   Status Achieved   PT SHORT TERM GOAL #2   Title The patient will ambulate x 400 feet nonstop on level surfaces with least restrictive device and supervision for return to limited community gait (walking into appts/restaurant, etc).   Baseline 11/01/14: 210 feet before needed to sit and rest with walker and min guard assist   Time --   Period --   Status Not Met   PT SHORT TERM GOAL #3   Title The patient will negotiate 4 steps x 4 reps (due to 2 level home) with CGA to min A and step to pattern using one handrail in order to access second floorof home.   Baseline 11/01/14:  pt performed 4 stairs x 3 reps as stated above with min assist to CGA   Time --   Period --   Status Partially Met   PT SHORT TERM GOAL #4   Title The patient will ambulate with min A using forearm crutches to begin transitioning from walker to less restrictive device x 50 feet.    Baseline --   Time --   Period --   Status Deferred   PT SHORT TERM GOAL #5   Title The patient will tolerate >10 minutes of standing exercise/activity in order to improve ability to do ADLs in standing for greater weight bearing in home environment.   Baseline 11/01/14: able to stand >10 minutes in session while engaged in gait/standing activities   Time --   Period --    Status Achieved   PT SHORT TERM GOAL #6   Title The patient will increase gait speed from 0.59 ft/sec up to 1.0 ft/sec to demo improved household mobility.   Baseline --   Time --   Period --   Status Not Met           PT Long Term Goals - 09/28/14 1041    PT LONG TERM GOAL #1   Title The patient will negotiate curbs/ramps with least restrictive device and supervision for return to limited community mobility.   Baseline Target date 11/27/2014   Time 8   Period Weeks   PT LONG TERM GOAL #2   Title The patient will verbalize understanding of community exercise routine for continued strengthening/endurance post d/c from PT   Baseline Target date 11/27/2014   Time 8   Period Weeks   PT LONG TERM GOAL #3   Title The patient will ambulate with least restrictive device modified indep for short community distances to improve independence for community appts/activities > 500 feet.   Baseline Target date 11/27/2014   Time 8   Period Weeks   PT LONG TERM GOAL #4   Title The patient will negotiate 16 steps (4 steps x 4 reps in clinic) with one handrail modified indep to improve access to home environment/bathroom.   Baseline Target date 11/27/2014   Time 8   Period Weeks   PT LONG TERM GOAL #5   Title The patient will move floor<>stand with UE support modified indep.   Baseline Target date 11/27/2014   Time 8   Period Weeks   Additional Long Term Goals   Additional Long Term Goals Yes   PT LONG TERM GOAL #6   Title The patient will increase gait speed from 0.59 ft/sec to > or equal to 1.3 ft/sec to demo transition to "limited community ambulator" classification of gait.   Baseline Target date 11/27/2014   Time 8   Period Weeks               Plan - 11/13/14 2052    Clinical Impression Statement The patient continues to be limited by decreased sensory feedback in LEs for proprioceptive awareness, as well as muscle tone.  His L LE tone increases ankle inversion during  swing phase of gait creating ankle instability at initial stance phase L LE.  His R LE has increased clonus with beating muscle spasms into plantar flexion when fatigued.  The patient also has decreased heel cord and hip flexor flexibility that hinder full upright standing posture.   PT Next Visit Plan Begin checking LTGs: push distance with RW and work on stairs/knee control.  Consider placing  on hold in next 4-6 weeks to improve household ambulation and f/u in 2-4 months for further progression of community gait.   Consulted and Agree with Plan of Care Patient        Problem List There are no active problems to display for this patient.   [place], [place] 11/13/2014, 9:01 PM  [facility] [address] [place], [place], [address] Phone: [phone]   Fax:  [phone]

## 2014-11-13 NOTE — Therapy (Signed)
[facility] [address] [place], [place], [address] Phone: [phone]   Fax:  [phone]  Occupational Therapy Treatment  Patient Details  Name: Richard Bridges MRN: [id number] Date of Birth: [birth year] Referring Provider:  Terese Door, MD  Encounter Date: 11/13/2014      OT End of Session - 11/13/14 1242    Visit Number 7   Number of Visits 17   Date for OT Re-Evaluation 12/03/14   Authorization Type [employer]   Authorization Time Period week 5/8   OT Start Time 1150   OT Stop Time 1220   OT Time Calculation (min) 30 min   Equipment Utilized During Treatment GAIT BELT   Activity Tolerance Patient tolerated treatment well      Past Medical History  Diagnosis Date  . Fractures involving multiple body regions 04/14/2014    s/p multi-trauma with pelvic and spinal fractures    Past Surgical History  Procedure Laterality Date  . Spine surgery  04/15/2014    laminectomy s/p multi-trauma  . 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  Visit Diagnosis:  Decreased functional mobility and endurance  Generalized muscle weakness      Subjective Assessment - 11/13/14 1149    Subjective  No changes reported   Pertinent History multi-trauma and T11-12 Laminectomy from accident on 04/14/14, spleenectomy   Patient Stated Goals Pt reports "that Christina from PT set [place] up to come back to OT, b/c I'm standing longer"     Currently in Pain? No/denies                      OT Treatments/Exercises (OP) - 11/13/14 0001    ADLs   Functional Mobility dynamic standing and side stepping for simulated kitchen tasks: getting out pan, filling with water, and taking to stove, bringing back to sink with one hand counter support and CGA/gait belt for safety. Followed by disengaging BUE's standing to retrieve objects in higher cabinets with CGA/gait belt d/t near LOB. Pt then  reaching outside BOS bilaterally (mid and lower surfaces) to activate eccentric quad control/LE control   Neurological Re-education Exercises   Other Exercises 1 Tall kneeling: trunk rotation with diagonal patterns, BUE arm exercises for lower trunk/pelvic control, with multiple rest breaks required and LOB.                   OT Short Term Goals - 10/08/14 1407    OT SHORT TERM GOAL #1   Title I updated HEP for static/dynamic standing tasks at sink level (DUE 10/31/14)   Time 4   Period Weeks   Status On-going   OT SHORT TERM GOAL #2   Title Pt will perform simulated tub transfer using necessary DME w/ Mod I - distant supervision.   Time 4   Period Weeks   Status Achieved           OT Long Term Goals - 10/03/14 1118    OT LONG TERM GOAL #1   Title Pt to perform grooming activities in standing at sink for 10 minutes w/o rest (due 11/28/14)   Time 8   Period Weeks   Status New   OT LONG TERM GOAL #2   Title Pt to stand consistently to retrieve objects out of cabinets and stand at stove for cooking tasks using w/c only as needed and countertop support   Time 8  Period Weeks   Status New   OT LONG TERM GOAL #3   Title Pt to perform laundry tasks mod I level in standing   Time 8   Period Weeks   Status New   OT LONG TERM GOAL #4   Title Pt will demonstrate dynamic standing balance consistently to place/retrieve items to/from dishwasher using countertop as support PRN.   Time 8   Period Weeks   Status New               Plan - 11/13/14 1242    Clinical Impression Statement Pt fatigued today and required more rest breaks in tall kneeling. Pt performed dynamic standing in functional activity with increased endurance.    Plan sitting on physioball, standing tolerance   Consulted and Agree with Plan of Care Patient        Problem List There are no active problems to display for this patient.   Carey Bullocks, OTR/L 11/13/2014, 12:45 PM  [facility] [address] [place], [place], [address] Phone: [phone]   Fax:  [phone]

## 2014-11-15 ENCOUNTER — Ambulatory Visit: Payer: PRIVATE HEALTH INSURANCE | Admitting: Rehabilitative and Restorative Service Providers"

## 2014-11-16 ENCOUNTER — Encounter: Payer: Self-pay | Admitting: Physical Therapy

## 2014-11-16 ENCOUNTER — Ambulatory Visit: Payer: PRIVATE HEALTH INSURANCE | Admitting: Physical Therapy

## 2014-11-16 DIAGNOSIS — R269 Unspecified abnormalities of gait and mobility: Secondary | ICD-10-CM

## 2014-11-16 DIAGNOSIS — Z7409 Other reduced mobility: Secondary | ICD-10-CM

## 2014-11-16 DIAGNOSIS — M6281 Muscle weakness (generalized): Secondary | ICD-10-CM

## 2014-11-16 DIAGNOSIS — R5381 Other malaise: Secondary | ICD-10-CM

## 2014-11-17 NOTE — Therapy (Signed)
[facility] [address] [place], [place], [address] Phone: [phone]   Fax:  [phone]  Physical Therapy Treatment  Patient Details  Name: Richard Bridges MRN: [id number] Date of Birth: 1975-06-11 Referring Provider:  Terese Door, MD  Encounter Date: 11/16/2014     11/16/14 1107  PT Visits / Re-Eval  Visit Number 45  Number of Visits 48  Date for PT Re-Evaluation 11/27/14  Authorization  Authorization Type $35 copay, no visit limit  PT Time Calculation  PT Start Time 1101  PT Stop Time 1141  PT Time Calculation (min) 40 min  PT - End of Session  Equipment Utilized During Treatment Gait belt  Activity Tolerance Patient tolerated treatment well  Behavior During Therapy [facility] for tasks assessed/performed    Past Medical History  Diagnosis Date  . Fractures involving multiple body regions 04/14/2014    s/p multi-trauma with pelvic and spinal fractures    Past Surgical History  Procedure Laterality Date  . Spine surgery  04/15/2014    laminectomy s/p multi-trauma  . 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  Visit Diagnosis:  Decreased functional mobility and endurance  Generalized muscle weakness  Abnormality of gait  Physical deconditioning   11/16/14 1106  Symptoms/Limitations  Subjective No new complaints. No falls or pain to report.  Pain Assessment  Currently in Pain? No/denies  Pain Score 0      11/16/14 1109  Transfers  Sit to Stand 5: Supervision;4: Min guard;With upper extremity assist;From bed;From chair/3-in-1  Stand to Sit 5: Supervision;4: Min guard;With upper extremity assist;To bed;To chair/3-in-1  Ambulation/Gait  Ambulation/Gait Yes  Ambulation/Gait Assistance 4: Min guard;5: Supervision  Ambulation/Gait Assistance Details cues on posture, walker position and to correct gait deviations. increased scissoring toward end of second  gait trial.                                 Ambulation Distance (Feet) 330 Feet (x2)  Assistive device Rolling walker  Gait Pattern Step-through pattern;Decreased stride length;Narrow base of support;Poor foot clearance - left;Poor foot clearance - right;Ataxic  Ambulation Surface Level;Indoor  Gait velocity 43.03 sec's= 0.76 ft/sec with walker          PT Short Term Goals - 11/01/14 1021    PT SHORT TERM GOAL #1   Title The patient will ambulate household distances modified indep x 75 feet for improved independence with mobility.   Baseline Met on 10/23/2014.   Time 4   Period Weeks   Status Achieved   PT SHORT TERM GOAL #2   Title The patient will ambulate x 400 feet nonstop on level surfaces with least restrictive device and supervision for return to limited community gait (walking into appts/restaurant, etc).   Baseline 11/01/14: 210 feet before needed to sit and rest with walker and min guard assist   Time --   Period --   Status Not Met   PT SHORT TERM GOAL #3   Title The patient will negotiate 4 steps x 4 reps (due to 2 level home) with CGA to min A and step to pattern using one handrail in order to access second floorof home.   Baseline 11/01/14: pt performed 4 stairs x 3 reps as stated above with min assist to CGA   Time --   Period --   Status Partially  Met   PT SHORT TERM GOAL #4   Title The patient will ambulate with min A using forearm crutches to begin transitioning from walker to less restrictive device x 50 feet.    Baseline --   Time --   Period --   Status Deferred   PT SHORT TERM GOAL #5   Title The patient will tolerate >10 minutes of standing exercise/activity in order to improve ability to do ADLs in standing for greater weight bearing in home environment.   Baseline 11/01/14: able to stand >10 minutes in session while engaged in gait/standing activities   Time --   Period --   Status Achieved   PT SHORT TERM GOAL #6   Title The patient will increase  gait speed from 0.59 ft/sec up to 1.0 ft/sec to demo improved household mobility.   Baseline --   Time --   Period --   Status Not Met           PT Long Term Goals - 11/16/14 1108    PT LONG TERM GOAL #1   Title The patient will negotiate curbs/ramps with least restrictive device and supervision for return to limited community mobility.   Baseline Target date 11/27/2014   Time 8   Period Weeks   PT LONG TERM GOAL #2   Title The patient will verbalize understanding of community exercise routine for continued strengthening/endurance post d/c from PT   Baseline Target date 11/27/2014   Time 8   Period Weeks   PT LONG TERM GOAL #3   Title The patient will ambulate with least restrictive device modified indep for short community distances to improve independence for community appts/activities > 500 feet.   Baseline --   Time --   Period --   Status Not Met   PT LONG TERM GOAL #4   Title The patient will negotiate 16 steps (4 steps x 4 reps in clinic) with one handrail modified indep to improve access to home environment/bathroom.   Baseline Target date 11/27/2014   Time 8   Period Weeks   PT LONG TERM GOAL #5   Title The patient will move floor<>stand with UE support modified indep.   Baseline Target date 11/27/2014   Time 8   Period Weeks   PT LONG TERM GOAL #6   Title The patient will increase gait speed from 0.59 ft/sec to > or equal to 1.3 ft/sec to demo transition to "limited community ambulator" classification of gait.   Baseline --   Time --   Period --   Status Not Met        11/16/14 1108  Plan  Clinical Impression Statement Focues on checking gait goals today. Pt did not meet LTG"s, however did show progress from STG's.   Pt will benefit from skilled therapeutic intervention in order to improve on the following deficits Abnormal gait;Decreased balance;Decreased mobility;Decreased activity tolerance;Decreased range of motion;Difficulty walking;Impaired  flexibility;Impaired sensation;Impaired tone;Increased muscle spasms;Decreased strength;Pain  PT Frequency 2x / week  PT Duration 8 weeks  PT Treatment/Interventions ADLs/Self Care Home Management;Electrical Stimulation;Functional mobility training;Therapeutic activities;Patient/family education;Passive range of motion;Wheelchair mobility training;Therapeutic exercise;DME Instruction;Gait training;Balance training;Neuromuscular re-education;Stair training;Aquatic Therapy;Orthotic Fit/Training  PT Next Visit Plan assess remaining LTG"s.  Consulted and Agree with Plan of Care Patient    Problem List There are no active problems to display for this patient.   Willow Ora 11/17/2014, 10:38 PM  Willow Ora, PTA, [facility] [address], [place],  [place] [address] [phone] 11/17/2014, 10:38 PM

## 2014-11-20 ENCOUNTER — Ambulatory Visit: Payer: PRIVATE HEALTH INSURANCE | Admitting: Occupational Therapy

## 2014-11-20 ENCOUNTER — Ambulatory Visit: Payer: PRIVATE HEALTH INSURANCE | Admitting: Rehabilitative and Restorative Service Providers"

## 2014-11-20 ENCOUNTER — Encounter: Payer: Self-pay | Admitting: Occupational Therapy

## 2014-11-20 DIAGNOSIS — M6281 Muscle weakness (generalized): Secondary | ICD-10-CM

## 2014-11-20 DIAGNOSIS — Z7409 Other reduced mobility: Secondary | ICD-10-CM

## 2014-11-20 DIAGNOSIS — R269 Unspecified abnormalities of gait and mobility: Secondary | ICD-10-CM

## 2014-11-20 NOTE — Therapy (Signed)
[facility] [address] [place], [place], [address] Phone: [phone]   Fax:  [phone]  Occupational Therapy Treatment  Patient Details  Name: Richard Bridges MRN: [id number] Date of Birth: September 25, 1975 Referring Provider:  Terese Door, MD  Encounter Date: 11/20/2014      OT End of Session - 11/20/14 1324    Visit Number 8   Number of Visits 17   Date for OT Re-Evaluation 12/03/14   Authorization Type [employer]   Authorization Time Period week 6/8   OT Start Time 1147   OT Stop Time 1230   OT Time Calculation (min) 43 min   Activity Tolerance Patient tolerated treatment well      Past Medical History  Diagnosis Date  . Fractures involving multiple body regions 04/14/2014    s/p multi-trauma with pelvic and spinal fractures    Past Surgical History  Procedure Laterality Date  . Spine surgery  04/15/2014    laminectomy s/p multi-trauma  . 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  Visit Diagnosis:  Decreased functional mobility and endurance  Generalized muscle weakness  Impaired mobility and ADLs      Subjective Assessment - 11/20/14 1153    Subjective  I am fine thanks   Patient is accompained by: Family member   Pertinent History multi-trauma and T11-12 Laminectomy from accident on 04/14/14, spleenectomy   Patient Stated Goals Pt reports "that Christina from PT set [place] up to come back to OT, b/c I'm standing longer"     Currently in Pain? No/denies                      OT Treatments/Exercises (OP) - 11/20/14 0001    Neurological Re-education Exercises   Other Exercises 1 Neuro re ed to address sit to stand, standing balance (static and dynamic), stand to sit, and aligntment. Also addressed functional bilateral UE tasks in standing to reduce reliance on UE's for balance. BLE clonus interferes with standing balance and standing tolerance  - pt unable to tell when he is malalinged in standing (i.e. heels off floor, R foot eversion, locked knees, hip flexion) due to absent sensation. Incorporated heavy weightbearing thru hips into heels with focus on alignment to reduce clonus with some success. Pt able to fold laundry with close supervision and rest breaks. Pt to try standing at home to brush teeth with supervision                  OT Short Term Goals - 11/20/14 1321    OT SHORT TERM GOAL #1   Title I updated HEP for static/dynamic standing tasks at sink level (DUE 10/31/14)   Time 4   Period Weeks   Status On-going   OT SHORT TERM GOAL #2   Title Pt will perform simulated tub transfer using necessary DME w/ Mod I - distant supervision.   Time 4   Period Weeks   Status Achieved           OT Long Term Goals - 11/20/14 1322    OT LONG TERM GOAL #1   Title Pt to perform grooming activities in standing at sink for 10 minutes w/o rest (due 11/28/14)   Time 8   Period Weeks   Status New   OT LONG TERM GOAL #2   Title Pt to stand consistently to retrieve objects out of cabinets and  stand at stove for cooking tasks using w/c only as needed and countertop support   Time 8   Period Weeks   Status New   OT LONG TERM GOAL #3   Title Pt to perform laundry tasks mod I level in standing   Time 8   Period Weeks   Status New   OT LONG TERM GOAL #4   Title Pt will demonstrate dynamic standing balance consistently to place/retrieve items to/from dishwasher using countertop as support PRN.   Time 8   Period Weeks   Status New               Plan - 11/20/14 1322    Clinical Impression Statement Pt making progress toward goals and able to complete task in standing requiring bilateral UE activity with supervision only.     Pt will benefit from skilled therapeutic intervention in order to improve on the following deficits (Retired) Impaired flexibility;Decreased endurance;Decreased activity tolerance;Decreased  knowledge of use of DME;Decreased mobility;Decreased strength   Rehab Potential Good   Clinical Impairments Affecting Rehab Potential frusteration level   OT Frequency 2x / week   OT Duration 8 weeks   OT Treatment/Interventions Self-care/ADL training;DME and/or AE instruction;Patient/family education;Therapeutic exercises;Balance training;Therapeutic activities;Functional Mobility Training;Passive range of motion   Plan standing tolerance, alignment, standing balance, bilateral UE activities in standing    Consulted and Agree with Plan of Care Patient        Problem List There are no active problems to display for this patient.   Quay Burow, OTR/L 11/20/2014, 1:25 PM  [facility] [address] [place], [place], [address] Phone: [phone]   Fax:  [phone]

## 2014-11-21 NOTE — Therapy (Signed)
[facility] [address] [place], [place], [address] Phone: [phone]   Fax:  [phone]  Physical Therapy Treatment  Patient Details  Name: Richard Bridges MRN: [id number] Date of Birth: 1975/07/13 Referring Provider:  Terese Door, MD  Encounter Date: 11/20/2014      PT End of Session - 11/21/14 1127    Visit Number 46   Number of Visits 48   Date for PT Re-Evaluation 11/27/14   Authorization Type $35 copay, no visit limit   PT Start Time 1235   PT Stop Time 1320   PT Time Calculation (min) 45 min   Equipment Utilized During Treatment Gait belt   Activity Tolerance Patient tolerated treatment well   Behavior During Therapy [facility] for tasks assessed/performed      Past Medical History  Diagnosis Date  . Fractures involving multiple body regions 04/14/2014    s/p multi-trauma with pelvic and spinal fractures    Past Surgical History  Procedure Laterality Date  . Spine surgery  04/15/2014    laminectomy s/p multi-trauma  . 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  Visit Diagnosis:  Generalized muscle weakness  Abnormality of gait      Subjective Assessment - 11/21/14 1111    Subjective The patient reports a fall this morning due to a new loaner w/c being dropped off and anti-tippers not being in place.  He did not sustain an injury and he was able to return to his chair modified indep.   Patient is accompained by: Family member   Patient Stated Goals Getting back to walking.   Currently in Pain? No/denies           [facility] Adult PT Treatment/Exercise - 11/21/14 1117    Ambulation/Gait   Ambulation/Gait Yes   Ambulation/Gait Assistance 5: Supervision   Ambulation/Gait Assistance Details cues on pelvic position encouraging posterior pelvic tilt, core activation and heels on the ground at initial contact   Ambulation Distance (Feet) 100 Feet  x 2 reps    Assistive device Rolling walker   Gait Pattern Step-through pattern;Decreased stride length;Narrow base of support;Poor foot clearance - left;Poor foot clearance - right;Ataxic   Ambulation Surface Level   Stairs Yes   Stairs Assistance 4: Min guard   Stairs Assistance Details (indicate cue type and reason) cues on knee control    Stair Management Technique One rail Left;One rail Right;Sideways  then up/down anteriorly x 4 steps with min guard   Number of Stairs 4  x 3 reps (L rail, R rail, bilateral rails anterior ascendin)   Gait Comments Worked on side-stepping with RW for patient to negotiate 1/2 bathroom downstairs to decrease dependence on potty chair; patient without sensation with looking down intermittently, cues to decrease weight bearing through RW   Self-Care   Self-Care Other Self-Care Comments   Other Self-Care Comments  PT and patient discussed upcoming recertification period.  Focus of upcoming sessions will be increasing indep in home, getting into/out of friends/family's homes without relying on manual w/c, and transitining to pool class in the community (after working with PT in the pool 1x/week).  PT discussed the next phase of therapy would focus on increasing community mobility and endurance.  We discussed thta it may be beneficial to hold PT for a couple of months to allow patient to have a break from PT and then return for further progression to independence in the  community.  Discussed return to work and provided the patient with voc rehab contact number and brochure.   Exercises   Exercises Knee/Hip   Knee/Hip Exercises: Stretches   Passive Hamstring Stretch Both;1 rep;60 seconds   Hip Flexor Stretch Both;1 rep;60 seconds   Other Knee/Hip Stretches hip adductor stretch                  PT Short Term Goals - 11/01/14 1021    PT SHORT TERM GOAL #1   Title The patient will ambulate household distances modified indep x 75 feet for improved independence with  mobility.   Baseline Met on 10/23/2014.   Time 4   Period Weeks   Status Achieved   PT SHORT TERM GOAL #2   Title The patient will ambulate x 400 feet nonstop on level surfaces with least restrictive device and supervision for return to limited community gait (walking into appts/restaurant, etc).   Baseline 11/01/14: 210 feet before needed to sit and rest with walker and min guard assist   Time --   Period --   Status Not Met   PT SHORT TERM GOAL #3   Title The patient will negotiate 4 steps x 4 reps (due to 2 level home) with CGA to min A and step to pattern using one handrail in order to access second floorof home.   Baseline 11/01/14: pt performed 4 stairs x 3 reps as stated above with min assist to CGA   Time --   Period --   Status Partially Met   PT SHORT TERM GOAL #4   Title The patient will ambulate with min A using forearm crutches to begin transitioning from walker to less restrictive device x 50 feet.    Baseline --   Time --   Period --   Status Deferred   PT SHORT TERM GOAL #5   Title The patient will tolerate >10 minutes of standing exercise/activity in order to improve ability to do ADLs in standing for greater weight bearing in home environment.   Baseline 11/01/14: able to stand >10 minutes in session while engaged in gait/standing activities   Time --   Period --   Status Achieved   PT SHORT TERM GOAL #6   Title The patient will increase gait speed from 0.59 ft/sec up to 1.0 ft/sec to demo improved household mobility.   Baseline --   Time --   Period --   Status Not Met           PT Long Term Goals - 11/21/14 1128    PT LONG TERM GOAL #1   Title The patient will negotiate curbs/ramps with least restrictive device and supervision for return to limited community mobility.   Baseline Target date 11/27/2014   Time 8   Period Weeks   PT LONG TERM GOAL #2   Title The patient will verbalize understanding of community exercise routine for continued  strengthening/endurance post d/c from PT   Baseline Target date 11/27/2014   Time 8   Period Weeks   PT LONG TERM GOAL #3   Title The patient will ambulate with least restrictive device modified indep for short community distances to improve independence for community appts/activities > 500 feet.   Status Not Met   PT LONG TERM GOAL #4   Title The patient will negotiate 16 steps (4 steps x 4 reps in clinic) with one handrail modified indep to improve access to home environment/bathroom.   Baseline Target  date 11/27/2014   Time 8   Period Weeks   PT LONG TERM GOAL #5   Title The patient will move floor<>stand with UE support modified indep.   Baseline Target date 11/27/2014   Time 8   Period Weeks   PT LONG TERM GOAL #6   Title The patient will increase gait speed from 0.59 ft/sec to > or equal to 1.3 ft/sec to demo transition to "limited community ambulator" classification of gait.   Status Not Met               Plan - 11/21/14 1129    Clinical Impression Statement PT plans to continue x 4-8 more weeks and then discussed with patient the amount of effort it would take to progress from household mobility to extended community mobility.  I recommended he think about whether or not he would want a break from PT before continuing more rigorous training.  PT plans to continue working towards current LTGs and complete a recert by 54/65/6812.     PT Next Visit Plan assess remaining LTGs, complete recertification x 8 weeks, check on how pool session went.   Consulted and Agree with Plan of Care Patient;Family member/caregiver   Family Member Consulted sister        Problem List There are no active problems to display for this patient.   [place], PT 11/21/2014, 11:32 AM  [facility] [address] [place], [place], [address] Phone: [phone]   Fax:  [phone]

## 2014-11-22 ENCOUNTER — Ambulatory Visit: Payer: PRIVATE HEALTH INSURANCE | Attending: Psychiatry | Admitting: Physical Therapy

## 2014-11-22 ENCOUNTER — Ambulatory Visit: Payer: PRIVATE HEALTH INSURANCE | Admitting: Physical Therapy

## 2014-11-22 DIAGNOSIS — M6281 Muscle weakness (generalized): Secondary | ICD-10-CM | POA: Insufficient documentation

## 2014-11-22 DIAGNOSIS — R269 Unspecified abnormalities of gait and mobility: Secondary | ICD-10-CM | POA: Insufficient documentation

## 2014-11-22 DIAGNOSIS — R5381 Other malaise: Secondary | ICD-10-CM

## 2014-11-22 DIAGNOSIS — Z658 Other specified problems related to psychosocial circumstances: Secondary | ICD-10-CM | POA: Diagnosis present

## 2014-11-22 DIAGNOSIS — Z7409 Other reduced mobility: Secondary | ICD-10-CM | POA: Diagnosis not present

## 2014-11-22 NOTE — Therapy (Signed)
[facility] [address], [place], [address] Phone: [phone]   Fax:  [phone]  Physical Therapy Treatment  Patient Details  Name: Richard Bridges MRN: [id number] Date of Birth: [birth year] Referring Provider:  Terese Door, MD  Encounter Date: 11/22/2014      PT End of Session - 11/22/14 1136    Visit Number 47   Number of Visits 48   Date for PT Re-Evaluation 11/27/14   Authorization Type $35 copay, no visit limit   PT Start Time 1035   PT Stop Time 1115   PT Time Calculation (min) 40 min   Activity Tolerance Patient tolerated treatment well;No increased pain   Behavior During Therapy [facility] for tasks assessed/performed      Past Medical History  Diagnosis Date  . Fractures involving multiple body regions 04/14/2014    s/p multi-trauma with pelvic and spinal fractures    Past Surgical History  Procedure Laterality Date  . Spine surgery  04/15/2014    laminectomy s/p multi-trauma  . 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  Visit Diagnosis:  Decreased functional mobility and endurance  Generalized muscle weakness  Impaired mobility and ADLs  Physical deconditioning  Decreased independence with transfers      Subjective Assessment - 11/22/14 1126    Subjective Patient reports he is doing well, wants to work on [place], stretching.    Patient is accompained by: Family member   Patient Stated Goals Getting back to walking.   Currently in Pain? No/denies                     Adult Aquatic Therapy - 11/22/14 1131    Aquatic Therapy Subjective   Subjective Patient reports he would like to work on stretching and strengthening so he can get back to work and walking.    Treatment   Gait Patient entered exited pool via ramp in water w/c. Pt able to walk some along rail in pool, forward and sidestepping x 1 min each.  Attempted walking short distance in pool with 2 noodles, but was unable to successfully do so.    Exercises Standing hip, calf knee stretching, hip abduction, hip extension stretch while floating on stomach. Kicking legs while floating on stomach holding to rail. Passive stretching of heel cords and knees x 1 min each bilaterally. R ankle difficult to stretch due to tremors.     Specific Exercises Hip/Low Back   Hip/Low Back Seated exercises to linclude laq, bicycles, hip abd/add x 2 min each. sitting without back support for core strengthening while doing leg exercises. x 2 min            [facility] Adult PT Treatment/Exercise - 11/21/14 1117    Ambulation/Gait   Ambulation/Gait Yes   Ambulation/Gait Assistance 5: Supervision   Ambulation/Gait Assistance Details cues on pelvic position encouraging posterior pelvic tilt, core activation and heels on the ground at initial contact   Ambulation Distance (Feet) 100 Feet  x 2 reps   Assistive device Rolling walker   Gait Pattern Step-through pattern;Decreased stride length;Narrow base of support;Poor foot clearance - left;Poor foot clearance - right;Ataxic   Ambulation Surface Level   Stairs Yes   Stairs Assistance 4: Min guard   Stairs Assistance Details (indicate cue type and reason) cues on knee control    Stair Management Technique One rail Left;One rail Right;Sideways  then up/down anteriorly x 4 steps with min guard   Number of Stairs 4  x 3 reps (L rail, R rail, bilateral rails anterior ascendin)   Gait Comments Worked on side-stepping with RW for patient to negotiate 1/2 bathroom downstairs to decrease dependence on potty chair; patient without sensation with looking down intermittently, cues to decrease weight bearing through RW   Self-Care   Self-Care Other Self-Care Comments   Other Self-Care Comments  PT and patient discussed upcoming recertification period.  Focus of upcoming sessions will be increasing indep in home, getting  into/out of friends/family's homes without relying on manual w/c, and transitining to pool class in the community (after working with PT in the pool 1x/week).  PT discussed the next phase of therapy would focus on increasing community mobility and endurance.  We discussed thta it may be beneficial to hold PT for a couple of months to allow patient to have a break from PT and then return for further progression to independence in the community.  Discussed return to work and provided the patient with voc rehab contact number and brochure.   Exercises   Exercises Knee/Hip   Knee/Hip Exercises: Stretches   Passive Hamstring Stretch Both;1 rep;60 seconds   Hip Flexor Stretch Both;1 rep;60 seconds   Other Knee/Hip Stretches hip adductor stretch                PT Education - 11/22/14 1135    Education provided Yes   Education Details benefits of stretching, aquatic therapy.    Person(s) Educated Patient;Caregiver(s)   Methods Explanation;Demonstration;Verbal cues   Comprehension Verbalized understanding;Returned demonstration;Verbal cues required          PT Short Term Goals - 11/01/14 1021    PT SHORT TERM GOAL #1   Title The patient will ambulate household distances modified indep x 75 feet for improved independence with mobility.   Baseline Met on 10/23/2014.   Time 4   Period Weeks   Status Achieved   PT SHORT TERM GOAL #2   Title The patient will ambulate x 400 feet nonstop on level surfaces with least restrictive device and supervision for return to limited community gait (walking into appts/restaurant, etc).   Baseline 11/01/14: 210 feet before needed to sit and rest with walker and min guard assist   Time --   Period --   Status Not Met   PT SHORT TERM GOAL #3   Title The patient will negotiate 4 steps x 4 reps (due to 2 level home) with CGA to min A and step to pattern using one handrail in order to access second floorof home.   Baseline 11/01/14: pt performed 4 stairs x 3  reps as stated above with min assist to CGA   Time --   Period --   Status Partially Met   PT SHORT TERM GOAL #4   Title The patient will ambulate with min A using forearm crutches to begin transitioning from walker to less restrictive device x 50 feet.    Baseline --   Time --   Period --   Status Deferred   PT SHORT TERM GOAL #5   Title The patient will tolerate >10 minutes of standing exercise/activity in order to improve ability to do ADLs in standing for greater weight bearing in home environment.   Baseline 11/01/14: able to stand >10 minutes in session while engaged in gait/standing activities   Time --   Period --   Status Achieved  PT SHORT TERM GOAL #6   Title The patient will increase gait speed from 0.59 ft/sec up to 1.0 ft/sec to demo improved household mobility.   Baseline --   Time --   Period --   Status Not Met           PT Long Term Goals - 11/21/14 1128    PT LONG TERM GOAL #1   Title The patient will negotiate curbs/ramps with least restrictive device and supervision for return to limited community mobility.   Baseline Target date 11/27/2014   Time 8   Period Weeks   PT LONG TERM GOAL #2   Title The patient will verbalize understanding of community exercise routine for continued strengthening/endurance post d/c from PT   Baseline Target date 11/27/2014   Time 8   Period Weeks   PT LONG TERM GOAL #3   Title The patient will ambulate with least restrictive device modified indep for short community distances to improve independence for community appts/activities > 500 feet.   Status Not Met   PT LONG TERM GOAL #4   Title The patient will negotiate 16 steps (4 steps x 4 reps in clinic) with one handrail modified indep to improve access to home environment/bathroom.   Baseline Target date 11/27/2014   Time 8   Period Weeks   PT LONG TERM GOAL #5   Title The patient will move floor<>stand with UE support modified indep.   Baseline Target date 11/27/2014    Time 8   Period Weeks   PT LONG TERM GOAL #6   Title The patient will increase gait speed from 0.59 ft/sec to > or equal to 1.3 ft/sec to demo transition to "limited community ambulator" classification of gait.   Status Not Met               Plan - 11/22/14 1136    Clinical Impression Statement Pt plans to return to aquatic therapy for 3 more sessions. Focusing on stretching, strengthening, relaxation.    Pt will benefit from skilled therapeutic intervention in order to improve on the following deficits Abnormal gait;Decreased balance;Decreased mobility;Decreased activity tolerance;Decreased range of motion;Difficulty walking;Impaired flexibility;Impaired sensation;Impaired tone;Increased muscle spasms;Decreased strength   Rehab Potential Good   PT Frequency 2x / week   PT Duration 8 weeks   PT Treatment/Interventions ADLs/Self Care Home Management;Electrical Stimulation;Functional mobility training;Therapeutic activities;Patient/family education;Passive range of motion;Wheelchair mobility training;Therapeutic exercise;DME Instruction;Gait training;Balance training;Neuromuscular re-education;Stair training;Aquatic Therapy;Orthotic Fit/Training   PT Next Visit Plan try walking with weights on ankles in pool.    PT Home Exercise Plan Goals for next certificaton period (stairs for home, gait speed, gait distance, increasing indep in home)   Consulted and Agree with Plan of Care Patient;Family member/caregiver        Problem List There are no active problems to display for this patient.   Breleigh Carpino, PT, MPT, GCS 11/22/2014, 11:40 AM  [facility] [address], [place], [address] Phone: [phone]   Fax:  [phone]

## 2014-11-23 ENCOUNTER — Ambulatory Visit: Payer: PRIVATE HEALTH INSURANCE | Admitting: Physical Therapy

## 2014-11-27 ENCOUNTER — Ambulatory Visit: Payer: PRIVATE HEALTH INSURANCE | Admitting: Rehabilitative and Restorative Service Providers"

## 2014-11-27 ENCOUNTER — Ambulatory Visit: Payer: PRIVATE HEALTH INSURANCE | Admitting: Occupational Therapy

## 2014-11-27 DIAGNOSIS — Z7409 Other reduced mobility: Secondary | ICD-10-CM

## 2014-11-27 DIAGNOSIS — R269 Unspecified abnormalities of gait and mobility: Secondary | ICD-10-CM

## 2014-11-27 DIAGNOSIS — M6281 Muscle weakness (generalized): Secondary | ICD-10-CM

## 2014-11-27 NOTE — Therapy (Signed)
[facility] [address] [place], [place], [address] Phone: [phone]   Fax:  [phone]  Occupational Therapy Treatment  Patient Details  Name: Richard Bridges MRN: [id number] Date of Birth: [birth year] No Data Recorded  Encounter Date: 11/27/2014      OT End of Session - 11/27/14 1155    Visit Number 9   Number of Visits 17   Date for OT Re-Evaluation 12/03/14   Authorization Type [employer]   Authorization Time Period week7/8   OT Start Time 1150   OT Stop Time 1230   OT Time Calculation (min) 40 min   Activity Tolerance Patient tolerated treatment well   Behavior During Therapy [facility] for tasks assessed/performed      Past Medical History  Diagnosis Date  . Fractures involving multiple body regions 04/14/2014    s/p multi-trauma with pelvic and spinal fractures    Past Surgical History  Procedure Laterality Date  . Spine surgery  04/15/2014    laminectomy s/p multi-trauma  . 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  Visit Diagnosis:  Generalized muscle weakness  Decreased functional mobility and endurance      Subjective Assessment - 11/27/14 1154    Subjective  Denies pain   Patient is accompained by: Family member   Patient Stated Goals Pt reports "that [place] from PT set [place] up to come back to OT, b/c I'm standing longer"     Currently in Pain? No/denies       Treatment: there ex: Arm bike x 8 mins level 8 for conditioning. theraputic act: Standing for functional overhead reaching then folding clothes with min A. Pt. Demonstrates significant frustration over spasms, and therapist changed activity to functional reaching while seated on ball for core stabilization, with anterior and lateral weight shifts, min A/ min facilitation.                          OT Short Term Goals - 11/20/14 1321    OT SHORT TERM GOAL #1   Title  I updated HEP for static/dynamic standing tasks at sink level (DUE 10/31/14)   Time 4   Period Weeks   Status On-going   OT SHORT TERM GOAL #2   Title Pt will perform simulated tub transfer using necessary DME w/ Mod I - distant supervision.   Time 4   Period Weeks   Status Achieved           OT Long Term Goals - 11/20/14 1322    OT LONG TERM GOAL #1   Title Pt to perform grooming activities in standing at sink for 10 minutes w/o rest (due 11/28/14)   Time 8   Period Weeks   Status New   OT LONG TERM GOAL #2   Title Pt to stand consistently to retrieve objects out of cabinets and stand at stove for cooking tasks using w/c only as needed and countertop support   Time 8   Period Weeks   Status New   OT LONG TERM GOAL #3   Title Pt to perform laundry tasks mod I level in standing   Time 8   Period Weeks   Status New   OT LONG TERM GOAL #4   Title Pt will demonstrate dynamic standing balance consistently to place/retrieve items to/from dishwasher using countertop as support PRN.   Time 8  Period Weeks   Status New               Plan - 11/27/14 1300    Clinical Impression Statement continue to address standing balance and tolerance as able. Pt was limited today by spasms   Pt will benefit from skilled therapeutic intervention in order to improve on the following deficits (Retired) Impaired flexibility;Decreased endurance;Decreased activity tolerance;Decreased knowledge of use of DME;Decreased mobility;Decreased strength   Rehab Potential Good   Clinical Impairments Affecting Rehab Potential frusteration level   OT Duration 8 weeks   OT Treatment/Interventions Self-care/ADL training;DME and/or AE instruction;Patient/family education;Therapeutic exercises;Balance training;Therapeutic activities;Functional Mobility Training;Passive range of motion   Plan standing tolerance   Consulted and Agree with Plan of Care Patient;Family member/caregiver   Family Member Consulted           Problem List There are no active problems to display for this patient.   Richard Bridges 11/27/2014, 1:03 PM [name], OTR/L [phone] Phone: [phone] 1:03 PM 11/27/2014 [facility] [address] [place], [place], [address] Phone: [phone]   Fax:  [phone]  Name: Richard Bridges MRN: [id number] Date of Birth: [birth year]

## 2014-11-28 NOTE — Therapy (Signed)
[facility] [address] [place], [place], [address] Phone: [phone]   Fax:  [phone]  Physical Therapy Treatment  Patient Details  Name: Richard Bridges MRN: [id number] Date of Birth: 1976/01/26 No Data Recorded  Encounter Date: 11/27/2014      PT End of Session - 11/27/14 2200    Visit Number 48   Number of Visits 64   Date for PT Re-Evaluation 01/25/15   Authorization Type $35 copay, no visit limit   PT Start Time 1235   PT Stop Time 1320   PT Time Calculation (min) 45 min   Equipment Utilized During Treatment Gait belt   Activity Tolerance Patient tolerated treatment well;No increased pain   Behavior During Therapy [facility] for tasks assessed/performed      Past Medical History  Diagnosis Date  . Fractures involving multiple body regions 04/14/2014    s/p multi-trauma with pelvic and spinal fractures    Past Surgical History  Procedure Laterality Date  . Spine surgery  04/15/2014    laminectomy s/p multi-trauma  . 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  Visit Diagnosis:  Abnormality of gait  Generalized muscle weakness      Subjective Assessment - 11/28/14 2140    Subjective Patient is having worse muscle spasms today.  He has completely stopped all medications for muscle spasms. He began pool PT within [facility] last week and feels this is a realistic post d/c from PT exercise program.  Patient expresses frustration of fighting through muscle tone for all movement.   Patient is accompained by: Family member  sister attends today's session   Patient Stated Goals Return to walking   Currently in Pain? No/denies          [facility] Adult PT Treatment/Exercise - 11/28/14 2149    Ambulation/Gait   Ambulation/Gait Yes   Ambulation/Gait Assistance 5: Supervision   Ambulation Distance (Feet) 100 Feet  ft x 2 reps   Assistive device Rolling  walker   Gait Pattern Step-through pattern;Decreased stride length;Narrow base of support;Poor foot clearance - left;Poor foot clearance - right;Ataxic   Ambulation Surface Level   Ramp 4: Min assist  CGA with verbal cues on best technique with RW   Curb 4: Min assist  CGA with cues on proper technique   Gait Comments Treadmill training x 3 minutes with bilateral UE support, cues to initiate knee flexion at initial swing phase of gait up to 0.8 mph   Self-Care   Self-Care Other Self-Care Comments   Other Self-Care Comments  PT recommends continuing to increase ambulation distance in home, rely less on bedside commode vs. 1/2 bathroom for toileting needs.  PT also recommends patient have CGA to attempt negotiating steps in home to access 2nd level in standing position vs. bumping up/down on his bottom (which is currently doing 2-3xeach week to access shower).   Therapeutic Activites    Therapeutic Activities ADL's   ADL's PT and patient discussed access to 1/2 bathroom on downstairs level of home.  The doorway is narrow and he would have to sidestep to enter.  PT worked with patient stepping sideways through narrow door x 2 reps and sitting on low level seat without using UEs for support. Patient is able to do this with supervision and PT recommended he carry this activity over to home with less dependence on bedside commode (except at night when AFOs  not donned).            PT Education - 11/28/14 2159    Education provided Yes   Education Details see self care notes   Person(s) Educated Patient;Caregiver(s)   Methods Explanation   Comprehension Verbalized understanding          PT Short Term Goals - 11/28/14 2206    PT SHORT TERM GOAL #1   Title The patient will report accessing 1/2 bathroom in home environment modified indep with RW.   Baseline Target date 12/27/2014   Time 4   Period Weeks   Status New   PT SHORT TERM GOAL #2   Title The patient will negotiate household  steps from standing position (vs. scooting on his bottom) with one handrail using sidestep technique with CGA for safety.   Baseline Target date 12/27/2014   Time 4   Period Weeks   Status New   PT SHORT TERM GOAL #3   Title The patient will ambulate x 450 ft nonstop with RW modified indep for improving limited community ambulation.   Baseline Target date 12/27/2014   Time 4   Period Weeks   Status New   PT SHORT TERM GOAL #4   Title The patient will negotiate 4 steps with bilateral handrails in step to pattern (anteriorly negotiating vs. sidestepping) in order to access community and friend/family homes.   Baseline Target date 12/27/2014   Time 4   Period Weeks   Status New   PT SHORT TERM GOAL #5   Title The patient will trial rollater RW in order to use seat/tray to carry plate from countertop to table for improved independence in kitchen/during meals.   Baseline Target date 12/27/2014   Time 4   Period Weeks   Status New   PT SHORT TERM GOAL #6   Title The patient will improve gait speed from 0.76 ft/sec to > or equal to 1.0 ft/sec for improving functional mobility.   Baseline Target date 12/27/2014   Time 4   Period Weeks   Status New           PT Long Term Goals - 11/28/14 2203    PT LONG TERM GOAL #1   Title The patient will negotiate curbs/ramps with least restrictive device and supervision for return to limited community mobility.   Baseline Updated LTG target date as 01/25/2015   Time 8   Period Weeks   Status On-going   PT LONG TERM GOAL #2   Title The patient will verbalize understanding of community exercise routine for continued strengthening/endurance post d/c from PT   Baseline Updated LTG target date as 01/25/2015   Time 8   Period Weeks   Status On-going   PT LONG TERM GOAL #3   Title The patient will ambulate with least restrictive device modified indep for short community distances to improve independence for community appts/activities > 500 feet.    Baseline Updated LTG target date as 01/25/2015   Time 8   Period Weeks   Status On-going   PT LONG TERM GOAL #4   Title The patient will negotiate 16 steps (4 steps x 4 reps in clinic) with one handrail modified indep to improve access to home environment/bathroom.   Baseline Updated LTG target date as 01/25/2015   Time 8   Period Weeks   Status On-going   PT LONG TERM GOAL #5   Title The patient will move floor<>stand with UE support modified indep.  Baseline Target date 11/27/2014   Time 8   Period Weeks   Status Achieved   PT LONG TERM GOAL #6   Title The patient will increase gait speed from 0.59 ft/sec to > or equal to 1.3 ft/sec to demo transition to "limited community ambulator" classification of gait.   Baseline Updated LTG target date as 01/25/2015   Time 8   Period Weeks   Status On-going               Plan - 11/28/14 2213    Clinical Impression Statement The patient is continuing to demonstrate progress with physical therapy services.  He has progressed from being non-ambulatory to a household ambulator.  PT is continuing to work towards limited community mobility, obstacle/various surface negotiation (stairs, curbs, ramps).  PT also emphasizing transition from one-on-one PT to community exercise in anticipation for d/c from PT after this 60 day certification period.   PT and patient have discussed use of forearm crutches--he does not feel comfortable progressing away from RW due to lack of sensation at this time.  The patient's main barriers to further progress at this time are :1 )  Diminished LE sensation 2) Muscle tone with recent allergy to baclofen noted.  PT recommends that the patient discuss other options for tone mgmt with MD at appt on November 8.  Specific muscle groups that impair functional mobility are: L ankle invertors during swing phase of gait making ankle stability challenging at initial contact and R ankle plantarflexors that have frequent clonus,  worse when fatigued.   Pt will benefit from skilled therapeutic intervention in order to improve on the following deficits Abnormal gait;Decreased balance;Decreased mobility;Decreased activity tolerance;Decreased range of motion;Difficulty walking;Impaired flexibility;Impaired sensation;Impaired tone;Increased muscle spasms;Decreased strength   Rehab Potential Good   PT Frequency 2x / week   PT Duration 8 weeks   PT Treatment/Interventions ADLs/Self Care Home Management;Electrical Stimulation;Functional mobility training;Therapeutic activities;Patient/family education;Passive range of motion;Wheelchair mobility training;Therapeutic exercise;DME Instruction;Gait training;Balance training;Neuromuscular re-education;Stair training;Aquatic Therapy;Orthotic Fit/Training   PT Next Visit Plan try walking with weights on ankles in pool.  On land PT to continue to progress endurance with gait, stair negotiation, curbs/ramps, and LE strength/stretching.   Consulted and Agree with Plan of Care Patient;Family member/caregiver   Family Member Consulted sister        Problem List There are no active problems to display for this patient.   Thank you for the referral of this patient. Rudell Cobb, MPT   [place], PT 11/28/2014, 10:23 PM  [facility] [address] [place], [place], [address] Phone: [phone]   Fax:  [phone]  Name: Richard Bridges MRN: [id number] Date of Birth: 12/04/1975

## 2014-11-29 ENCOUNTER — Ambulatory Visit: Payer: PRIVATE HEALTH INSURANCE | Admitting: Physical Therapy

## 2014-11-29 ENCOUNTER — Ambulatory Visit: Payer: PRIVATE HEALTH INSURANCE | Admitting: Rehabilitative and Restorative Service Providers"

## 2014-11-29 DIAGNOSIS — Z7409 Other reduced mobility: Secondary | ICD-10-CM | POA: Diagnosis not present

## 2014-11-29 DIAGNOSIS — M6281 Muscle weakness (generalized): Secondary | ICD-10-CM

## 2014-11-29 DIAGNOSIS — R269 Unspecified abnormalities of gait and mobility: Secondary | ICD-10-CM

## 2014-11-29 NOTE — Therapy (Signed)
[facility] [address], [place], [address] Phone: [phone]   Fax:  [phone]  Physical Therapy Treatment  Patient Details  Name: Richard Bridges MRN: [id number] Date of Birth: 1975-05-21 No Data Recorded  Encounter Date: 11/29/2014      PT End of Session - 11/29/14 1623    Visit Number 49   Number of Visits 74   Date for PT Re-Evaluation 01/25/15   Authorization Type $35 copay, no visit limit   PT Start Time 1100   PT Stop Time 1145   PT Time Calculation (min) 45 min   Activity Tolerance Patient tolerated treatment well;No increased pain   Behavior During Therapy [facility] for tasks assessed/performed      Past Medical History  Diagnosis Date  . Fractures involving multiple body regions 04/14/2014    s/p multi-trauma with pelvic and spinal fractures    Past Surgical History  Procedure Laterality Date  . Spine surgery  04/15/2014    laminectomy s/p multi-trauma  . 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  Visit Diagnosis:  Generalized muscle weakness  Decreased functional mobility and endurance  Abnormality of gait      Subjective Assessment - 11/29/14 1614    Subjective Patient reports he changed his medications for muscle spasms. He is noted to have decreased tremors.    Patient is accompained by: Family member   Patient Stated Goals Return to walking   Currently in Pain? No/denies   Multiple Pain Sites No                     Adult Aquatic Therapy - 11/29/14 1615    Aquatic Therapy Subjective   Subjective Patient reports he would like to work on stretching and strengthening so he can get back to work and walking.    Treatment   Gait Patient entered exited pool via ramp in water w/c with PT assist. Donned ankle weights in water this session to assist with walking and for increased proprioception.  Pt able to walk  along  rail in pool, forward and sidestepping x 2 min each.  Pt demonstrates improved ability with gait wearing ankle weights.     Exercises Standing hip, and calf stretching, hip adductor stretch,  hip abduction, hip extension stretch while floating on stomach. Low back stretch and hip flexor stretch while standing at rail.      Specific Exercises Hip/Low Back   Hip/Low Back Seated exercises to linclude laq, bicycles, hip abd/add x 2 min each. sitting without back support for core strengthening while doing leg exercises. x 2 min while wearing ankle weights bilaterally.  Core strength- seated without back support performing leg and arm exercises. Cues given for upright posture and to hold abdominal muscles tight.  x 5 min.            [place] Adult PT Treatment/Exercise - 11/28/14 2149    Ambulation/Gait   Ambulation/Gait Yes   Ambulation/Gait Assistance 5: Supervision   Ambulation Distance (Feet) 100 Feet  ft x 2 reps   Assistive device Rolling walker   Gait Pattern Step-through pattern;Decreased stride length;Narrow base of support;Poor foot clearance - left;Poor foot clearance - right;Ataxic   Ambulation Surface Level   Ramp 4: Min assist  CGA with verbal cues on best technique with RW   Curb 4: Min assist  CGA with cues on proper  technique   Gait Comments Treadmill training x 3 minutes with bilateral UE support, cues to initiate knee flexion at initial swing phase of gait up to 0.8 mph   Self-Care   Self-Care Other Self-Care Comments   Other Self-Care Comments  PT recommends continuing to increase ambulation distance in home, rely less on bedside commode vs. 1/2 bathroom for toileting needs.  PT also recommends patient have CGA to attempt negotiating steps in home to access 2nd level in standing position vs. bumping up/down on his bottom (which is currently doing 2-3xeach week to access shower).   Therapeutic Activites    Therapeutic Activities ADL's   ADL's PT and patient discussed access to  1/2 bathroom on downstairs level of home.  The doorway is narrow and he would have to sidestep to enter.  PT worked with patient stepping sideways through narrow door x 2 reps and sitting on low level seat without using UEs for support. Patient is able to do this with supervision and PT recommended he carry this activity over to home with less dependence on bedside commode (except at night when AFOs not donned).                 PT Education - 11/28/14 2159    Education provided Yes   Education Details see self care notes   Person(s) Educated Patient;Caregiver(s)   Methods Explanation   Comprehension Verbalized understanding          PT Short Term Goals - 11/28/14 2206    PT SHORT TERM GOAL #1   Title The patient will report accessing 1/2 bathroom in home environment modified indep with RW.   Baseline Target date 12/27/2014   Time 4   Period Weeks   Status New   PT SHORT TERM GOAL #2   Title The patient will negotiate household steps from standing position (vs. scooting on his bottom) with one handrail using sidestep technique with CGA for safety.   Baseline Target date 12/27/2014   Time 4   Period Weeks   Status New   PT SHORT TERM GOAL #3   Title The patient will ambulate x 450 ft nonstop with RW modified indep for improving limited community ambulation.   Baseline Target date 12/27/2014   Time 4   Period Weeks   Status New   PT SHORT TERM GOAL #4   Title The patient will negotiate 4 steps with bilateral handrails in step to pattern (anteriorly negotiating vs. sidestepping) in order to access community and friend/family homes.   Baseline Target date 12/27/2014   Time 4   Period Weeks   Status New   PT SHORT TERM GOAL #5   Title The patient will trial rollater RW in order to use seat/tray to carry plate from countertop to table for improved independence in kitchen/during meals.   Baseline Target date 12/27/2014   Time 4   Period Weeks   Status New   PT SHORT TERM  GOAL #6   Title The patient will improve gait speed from 0.76 ft/sec to > or equal to 1.0 ft/sec for improving functional mobility.   Baseline Target date 12/27/2014   Time 4   Period Weeks   Status New           PT Long Term Goals - 11/28/14 2203    PT LONG TERM GOAL #1   Title The patient will negotiate curbs/ramps with least restrictive device and supervision for return to limited community mobility.  Baseline Updated LTG target date as 01/25/2015   Time 8   Period Weeks   Status On-going   PT LONG TERM GOAL #2   Title The patient will verbalize understanding of community exercise routine for continued strengthening/endurance post d/c from PT   Baseline Updated LTG target date as 01/25/2015   Time 8   Period Weeks   Status On-going   PT LONG TERM GOAL #3   Title The patient will ambulate with least restrictive device modified indep for short community distances to improve independence for community appts/activities > 500 feet.   Baseline Updated LTG target date as 01/25/2015   Time 8   Period Weeks   Status On-going   PT LONG TERM GOAL #4   Title The patient will negotiate 16 steps (4 steps x 4 reps in clinic) with one handrail modified indep to improve access to home environment/bathroom.   Baseline Updated LTG target date as 01/25/2015   Time 8   Period Weeks   Status On-going   PT LONG TERM GOAL #5   Title The patient will move floor<>stand with UE support modified indep.   Baseline Target date 11/27/2014   Time 8   Period Weeks   Status Achieved   PT LONG TERM GOAL #6   Title The patient will increase gait speed from 0.59 ft/sec to > or equal to 1.3 ft/sec to demo transition to "limited community ambulator" classification of gait.   Baseline Updated LTG target date as 01/25/2015   Time 8   Period Weeks   Status On-going               Plan - 11/29/14 1625    Clinical Impression Statement Patient shows improved comfort in the water this session. Pt  performed improved ambulation with ankle weights donned in the water this session.    Pt will benefit from skilled therapeutic intervention in order to improve on the following deficits Abnormal gait;Decreased balance;Decreased mobility;Decreased activity tolerance;Decreased range of motion;Difficulty walking;Impaired flexibility;Impaired sensation;Impaired tone;Increased muscle spasms;Decreased strength   Rehab Potential Good   PT Frequency 2x / week   PT Duration 8 weeks   PT Treatment/Interventions ADLs/Self Care Home Management;Electrical Stimulation;Functional mobility training;Therapeutic activities;Patient/family education;Passive range of motion;Wheelchair mobility training;Therapeutic exercise;DME Instruction;Gait training;Balance training;Neuromuscular re-education;Stair training;Aquatic Therapy;Orthotic Fit/Training   PT Home Exercise Plan Goals for next certificaton period (stairs for home, gait speed, gait distance, increasing indep in home)   Consulted and Agree with Plan of Care Patient;Family member/caregiver        Problem List There are no active problems to display for this patient.   Javier Gell, PT, MPT, GCS 11/29/2014, 5:04 PM  [facility] [address], [place], [address] Phone: [phone]   Fax:  [phone]  Name: Richard Bridges MRN: [id number] Date of Birth: [birth year]

## 2014-11-30 ENCOUNTER — Ambulatory Visit: Payer: PRIVATE HEALTH INSURANCE | Admitting: Rehabilitative and Restorative Service Providers"

## 2014-12-04 ENCOUNTER — Ambulatory Visit: Payer: PRIVATE HEALTH INSURANCE | Admitting: Rehabilitative and Restorative Service Providers"

## 2014-12-04 ENCOUNTER — Ambulatory Visit: Payer: PRIVATE HEALTH INSURANCE | Admitting: Occupational Therapy

## 2014-12-04 DIAGNOSIS — Z789 Other specified health status: Secondary | ICD-10-CM

## 2014-12-04 DIAGNOSIS — M6281 Muscle weakness (generalized): Secondary | ICD-10-CM

## 2014-12-04 DIAGNOSIS — R269 Unspecified abnormalities of gait and mobility: Secondary | ICD-10-CM

## 2014-12-04 DIAGNOSIS — Z7409 Other reduced mobility: Secondary | ICD-10-CM

## 2014-12-04 NOTE — Therapy (Signed)
[facility] [address] [place], [place], [address] Phone: [phone]   Fax:  [phone]  Physical Therapy Treatment  Patient Details  Name: TOBIAS AVITABILE MRN: [id number] Date of Birth: Sep 22, 1975 No Data Recorded  Encounter Date: 12/04/2014      PT End of Session - 12/04/14 2218    Visit Number 50   Number of Visits 64   Date for PT Re-Evaluation 01/25/15   Authorization Type $35 copay, no visit limit   PT Start Time 1233   PT Stop Time 1320   PT Time Calculation (min) 47 min   Equipment Utilized During Treatment Gait belt   Activity Tolerance Patient tolerated treatment well;No increased pain   Behavior During Therapy [facility] for tasks assessed/performed      Past Medical History  Diagnosis Date  . Fractures involving multiple body regions 04/14/2014    s/p multi-trauma with pelvic and spinal fractures    Past Surgical History  Procedure Laterality Date  . Spine surgery  04/15/2014    laminectomy s/p multi-trauma  . 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  Visit Diagnosis:  Abnormality of gait  Generalized muscle weakness      Subjective Assessment - 12/04/14 1237    Subjective The patient began medications for muscle tone.  He worked on walking in the pool with [employer].       Gait: Performed gait training with rollator walker, CGA, to increase functionality and independence in the home x 115 ft. Pt reported that he was easily fatigued and that it took a lot of UE strength and control to use the rollator walker.  Stair negotiation of 4 stairs up and down x 2 CGA. With use of single handrail to imitate home environment.  Curb and ramp negotiation CGA with RW  THERAPEUTIC EXERCISE: Hamstring, Quad, and gastroc stretches held for 30-60 sec. Performed as P/ROM in supine position.       PT Short Term Goals - 12/04/14 2226    PT SHORT TERM  GOAL #1   Title The patient will report accessing 1/2 bathroom in home environment modified indep with RW.   Baseline Target date 12/27/2014   Time 4   Period Weeks   Status On-going   PT SHORT TERM GOAL #2   Title The patient will negotiate household steps from standing position (vs. scooting on his bottom) with one handrail using sidestep technique with CGA for safety.   Baseline Target date 12/27/2014   Time 4   Period Weeks   Status On-going   PT SHORT TERM GOAL #3   Title The patient will ambulate x 450 ft nonstop with RW modified indep for improving limited community ambulation.   Baseline Target date 12/27/2014   Time 4   Period Weeks   Status On-going   PT SHORT TERM GOAL #4   Title The patient will negotiate 4 steps with bilateral handrails in step to pattern (anteriorly negotiating vs. sidestepping) in order to access community and friend/family homes.   Baseline Target date 12/27/2014   Time 4   Period Weeks   Status On-going   PT SHORT TERM GOAL #5   Title The patient will trial rollater RW in order to use seat/tray to carry plate from countertop to table for improved independence in kitchen/during meals.   Baseline Target date 12/27/2014   Time 4   Period Weeks  Status On-going   PT SHORT TERM GOAL #6   Title The patient will improve gait speed from 0.76 ft/sec to > or equal to 1.0 ft/sec for improving functional mobility.   Baseline Target date 12/27/2014   Time 4   Period Weeks   Status On-going           PT Long Term Goals - 11/28/14 2203    PT LONG TERM GOAL #1   Title The patient will negotiate curbs/ramps with least restrictive device and supervision for return to limited community mobility.   Baseline Updated LTG target date as 01/25/2015   Time 8   Period Weeks   Status On-going   PT LONG TERM GOAL #2   Title The patient will verbalize understanding of community exercise routine for continued strengthening/endurance post d/c from PT   Baseline  Updated LTG target date as 01/25/2015   Time 8   Period Weeks   Status On-going   PT LONG TERM GOAL #3   Title The patient will ambulate with least restrictive device modified indep for short community distances to improve independence for community appts/activities > 500 feet.   Baseline Updated LTG target date as 01/25/2015   Time 8   Period Weeks   Status On-going   PT LONG TERM GOAL #4   Title The patient will negotiate 16 steps (4 steps x 4 reps in clinic) with one handrail modified indep to improve access to home environment/bathroom.   Baseline Updated LTG target date as 01/25/2015   Time 8   Period Weeks   Status On-going   PT LONG TERM GOAL #5   Title The patient will move floor<>stand with UE support modified indep.   Baseline Target date 11/27/2014   Time 8   Period Weeks   Status Achieved   PT LONG TERM GOAL #6   Title The patient will increase gait speed from 0.59 ft/sec to > or equal to 1.3 ft/sec to demo transition to "limited community ambulator" classification of gait.   Baseline Updated LTG target date as 01/25/2015   Time 8   Period Weeks   Status On-going               Plan - 12/04/14 2221    Clinical Impression Statement Pt is continuing to show improvements and progress with physical therapy services. Pt is hesitant to progress to the rollator walker and prefers the RW. He also prefers to continue scooting on his bottom up the stairs instead of taking the LE braces on and off to side step up the stairs at home to shower. The LE spasms are a continuing limitation to ambulation and ROM needed for an improved gait pattern. PT suggested holding PT services until the pt is seen by the doctor to address the LE spasms on November 8th.  Pt encouraged to continue stretching and moving while at home.    PT Next Visit Plan Hold PT services until seen by the doctor on November 8th to address the LE spasms.  Continue to progress endurance with gait, stairs,  curbs/ramps, and continue LE stretching and strengthening.    Consulted and Agree with Plan of Care Patient;Family member/caregiver   Family Member Consulted sister        Problem List There are no active problems to display for this patient.   Sid[place], [place] 12/04/2014, 10:27 PM  [facility] [address] [place], [place], [address] Phone: [phone]   Fax:  [place]  Name: KEHINDE BOWDISH MRN: [id number] Date of Birth: 09/13/1975

## 2014-12-04 NOTE — Therapy (Signed)
[facility] [address] [place], [place], [address] Phone: [phone]   Fax:  [phone]  Occupational Therapy Treatment  Patient Details  Name: Richard Bridges MRN: [id number] Date of Birth: 27-Nov-1975 No Data Recorded  Encounter Date: 12/04/2014      OT End of Session - 12/04/14 1252    Visit Number 10   Number of Visits 17   Date for OT Re-Evaluation 12/03/14   Authorization Type [employer]   Authorization Time Period week8/8   OT Start Time 1147   OT Stop Time 1230   OT Time Calculation (min) 43 min   Activity Tolerance Patient tolerated treatment well      Past Medical History  Diagnosis Date  . Fractures involving multiple body regions 04/14/2014    s/p multi-trauma with pelvic and spinal fractures    Past Surgical History  Procedure Laterality Date  . Spine surgery  04/15/2014    laminectomy s/p multi-trauma  . 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  Visit Diagnosis:  Decreased functional mobility and endurance  Impaired mobility and ADLs  Generalized muscle weakness      Subjective Assessment - 12/04/14 1211    Subjective  Pt ok with d/c from O.T. today   Patient is accompained by: Family member  sister   Pertinent History multi-trauma and T11-12 Laminectomy from accident on 04/14/14, spleenectomy   Currently in Pain? No/denies                      OT Treatments/Exercises (OP) - 12/04/14 0001    ADLs   ADL Comments Therapist and pt/family discussed possible d/c from O.T. today until pt can progress further with dynamic standing balance and mobility. Pt limited at this time due to fatigue, spasms, and decreased to abscent sensation in RLE. Pt/family shown walker tray, basket, and bag to transport things for functional tasks during mobility. Pt/family issued handouts and shown how/where to purchase. Pt/family also shown adaptive  mirror above stovetop to allow pt to see into pots/pans from w/c level. Pt advised not to stand when retrieving things off of stove for safety reasons. Pt instructed ok to stand however for retrieving things out of cabinets and moving along countertop with countertop support for mobility. Also reviewed safety techniques for standing with one hand countertop support vs. no hand support (using hips against countertop for balance). Pt also encouraged to continue exercises to maintain pelvic control including: wall bumps, bridging, and tall kneeling ex's. And to continue certain functional tasks in standing with countertop support including: folding laundry, grooming tasks, getting items out of cabinets, washing dishes, etc. Pt agreed.    Neurological Re-education Exercises   Other Exercises 1 Tall kneeling ex's for pelvic control with BUE's disengaged in diagonal movement patterns, trunk rotation bilaterally, and anterior pelvic tilts with trunk extension and bilateral shoulder horizontal abduction.    Other Exercises 2 Prone: stretch for trunk extension, anterior pelvic tilt with hip extension as able.                   OT Short Term Goals - 12/04/14 1253    OT SHORT TERM GOAL #1   Title I updated HEP for static/dynamic standing tasks at sink level (DUE 10/31/14)   Time 4   Period Weeks   Status Achieved   OT SHORT TERM GOAL #2   Title Pt will perform  simulated tub transfer using necessary DME w/ Mod I - distant supervision.   Time 4   Period Weeks   Status Achieved           OT Long Term Goals - 12/04/14 1253    OT LONG TERM GOAL #1   Title Pt to perform grooming activities in standing at sink for 10 minutes w/o rest (due 11/28/14)   Time 8   Period Weeks   Status Partially Met  inconsistent due to spasms which limit standing tolerance   OT LONG TERM GOAL #2   Title Pt to stand consistently to retrieve objects out of cabinets and stand at stove for cooking tasks using w/c only  as needed and countertop support   Time 8   Period Weeks   Status Partially Met  Pt able to stand to retrieve items out of cabinets and place things on stove, but recommend sitting to cook on stove and retrieve off stove for safety reasons d/t impaired balance and risk of burning self   OT LONG TERM GOAL #3   Title Pt to perform laundry tasks mod I level in standing   Time 8   Period Weeks   Status Achieved  however needs frequent rest breaks   OT LONG TERM GOAL #4   Title Pt will demonstrate dynamic standing balance consistently to place/retrieve items to/from dishwasher using countertop as support PRN.   Time 8   Period Weeks   Status Achieved  with countertop support only, and rest breaks prn               Plan - 12/04/14 1256    Clinical Impression Statement Pt partially met 2/4 LTG's due to decreased standing tolerance and spasms. Pt met 2 LTG'S with rest breaks prn and countertop support.    Plan D/C O.T. until further progression can be made with dynamic standing/functional mobility for IADL tasks. Recommend returning in 3-6 months when potentially more neurological healing has taken place   Consulted and Agree with Plan of Care Patient;Family member/caregiver       OCCUPATIONAL THERAPY DISCHARGE SUMMARY  Visits from Start of Care: 10  Current functional level related to goals / functional outcomes: SEE ABOVE   Remaining deficits: Dynamic standing balance and endurance Functional mobility   Education / Equipment: HEP, A/E recommendations Plan: Patient agrees to discharge.  Patient goals were partially met. Patient is being discharged due to being pleased with the current functional level.  And reaching maximal rehab potential at this current time; however recommend pt return in 3-6 months when further potential progress can be made.         Problem List There are no active problems to display for this patient.   Carey Bullocks,  OTR/L 12/04/2014, 1:00 PM  [facility] [address] [place], [place], [address] Phone: [phone]   Fax:  [phone]  Name: BEULAH MATUSEK MRN: [id number] Date of Birth: 1975/08/19

## 2014-12-06 ENCOUNTER — Ambulatory Visit: Payer: PRIVATE HEALTH INSURANCE | Admitting: Rehabilitative and Restorative Service Providers"

## 2014-12-06 ENCOUNTER — Ambulatory Visit: Payer: PRIVATE HEALTH INSURANCE | Admitting: Physical Therapy

## 2014-12-06 DIAGNOSIS — M6281 Muscle weakness (generalized): Secondary | ICD-10-CM

## 2014-12-06 DIAGNOSIS — Z7409 Other reduced mobility: Secondary | ICD-10-CM | POA: Diagnosis not present

## 2014-12-06 DIAGNOSIS — R269 Unspecified abnormalities of gait and mobility: Secondary | ICD-10-CM

## 2014-12-06 NOTE — Therapy (Signed)
[facility] [address] [place], [address] Phone: [phone]   Fax:  [phone]  Physical Therapy Treatment  Patient Details  Name: Richard Bridges MRN: [id number] Date of Birth: May 12, 1975 No Data Recorded  Encounter Date: 12/06/2014      PT End of Session - 12/06/14 1151    Visit Number 51   Number of Visits 50   Date for PT Re-Evaluation 01/25/15   Authorization Type $35 copay, no visit limit   PT Start Time 1015   PT Stop Time 1100   PT Time Calculation (min) 45 min   Equipment Utilized During Treatment Other (comment)  water ankle weights   Activity Tolerance Patient tolerated treatment well;No increased pain   Behavior During Therapy [facility] for tasks assessed/performed      Past Medical History  Diagnosis Date  . Fractures involving multiple body regions 04/14/2014    s/p multi-trauma with pelvic and spinal fractures    Past Surgical History  Procedure Laterality Date  . Spine surgery  04/15/2014    laminectomy s/p multi-trauma  . 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  Visit Diagnosis:  Decreased functional mobility and endurance  Generalized muscle weakness  Abnormality of gait      Subjective Assessment - 12/06/14 1147    Subjective Patient reports he is doing well. No reported issues.    Patient is accompained by: Family member   Patient Stated Goals Return to walking   Currently in Pain? No/denies                     Adult Aquatic Therapy - 12/06/14 1147    Aquatic Therapy Subjective   Subjective Patient reports he would like to work on stretching and strengthening so he can get back to work and walking.    Treatment   Gait Patient entered exited pool via ramp in water w/c with PT assist. Donned ankle weights in water this session to assist with walking and for increased proprioception.  Pt able to walk   along rail in pool, forward and sidestepping x 5 min each.  Pt demonstrates improved ability with gait wearing ankle weights.     Exercises Standing hip, and calf stretching, hip adductor stretch,  hip abduction, hip extension stretch while floating on stomach. Low back stretch and hip flexor stretch while standing at rail.      Specific Exercises Hip/Low Back   Hip/Low Back Seated exercises to linclude laq, bicycles, hip abd/add x 3 min each sitting without back support for core strengthening while wearing ankle weights bilaterally.  Cues given for upright posture and to hold abdominal muscles tight.                      PT Education - 12/06/14 1151    Education provided Yes   Education Details proper form with activities   Person(s) Educated Patient   Methods Explanation;Demonstration   Comprehension Verbalized understanding;Returned demonstration          PT Short Term Goals - 12/04/14 2226    PT SHORT TERM GOAL #1   Title The patient will report accessing 1/2 bathroom in home environment modified indep with RW.   Baseline Target date 12/27/2014   Time 4   Period Weeks   Status On-going   PT SHORT TERM GOAL #2   Title The  patient will negotiate household steps from standing position (vs. scooting on his bottom) with one handrail using sidestep technique with CGA for safety.   Baseline Target date 12/27/2014   Time 4   Period Weeks   Status On-going   PT SHORT TERM GOAL #3   Title The patient will ambulate x 450 ft nonstop with RW modified indep for improving limited community ambulation.   Baseline Target date 12/27/2014   Time 4   Period Weeks   Status On-going   PT SHORT TERM GOAL #4   Title The patient will negotiate 4 steps with bilateral handrails in step to pattern (anteriorly negotiating vs. sidestepping) in order to access community and friend/family homes.   Baseline Target date 12/27/2014   Time 4   Period Weeks   Status On-going   PT SHORT TERM  GOAL #5   Title The patient will trial rollater RW in order to use seat/tray to carry plate from countertop to table for improved independence in kitchen/during meals.   Baseline Target date 12/27/2014   Time 4   Period Weeks   Status On-going   PT SHORT TERM GOAL #6   Title The patient will improve gait speed from 0.76 ft/sec to > or equal to 1.0 ft/sec for improving functional mobility.   Baseline Target date 12/27/2014   Time 4   Period Weeks   Status On-going           PT Long Term Goals - 11/28/14 2203    PT LONG TERM GOAL #1   Title The patient will negotiate curbs/ramps with least restrictive device and supervision for return to limited community mobility.   Baseline Updated LTG target date as 01/25/2015   Time 8   Period Weeks   Status On-going   PT LONG TERM GOAL #2   Title The patient will verbalize understanding of community exercise routine for continued strengthening/endurance post d/c from PT   Baseline Updated LTG target date as 01/25/2015   Time 8   Period Weeks   Status On-going   PT LONG TERM GOAL #3   Title The patient will ambulate with least restrictive device modified indep for short community distances to improve independence for community appts/activities > 500 feet.   Baseline Updated LTG target date as 01/25/2015   Time 8   Period Weeks   Status On-going   PT LONG TERM GOAL #4   Title The patient will negotiate 16 steps (4 steps x 4 reps in clinic) with one handrail modified indep to improve access to home environment/bathroom.   Baseline Updated LTG target date as 01/25/2015   Time 8   Period Weeks   Status On-going   PT LONG TERM GOAL #5   Title The patient will move floor<>stand with UE support modified indep.   Baseline Target date 11/27/2014   Time 8   Period Weeks   Status Achieved   PT LONG TERM GOAL #6   Title The patient will increase gait speed from 0.59 ft/sec to > or equal to 1.3 ft/sec to demo transition to "limited community  ambulator" classification of gait.   Baseline Updated LTG target date as 01/25/2015   Time 8   Period Weeks   Status On-going               Plan - 12/06/14 1152    Clinical Impression Statement Patient showing increased confidence in the water and increased autonomy with activities. He will be ready to progress  to community pool soon.    Pt will benefit from skilled therapeutic intervention in order to improve on the following deficits Abnormal gait;Decreased balance;Decreased mobility;Decreased activity tolerance;Decreased range of motion;Difficulty walking;Impaired flexibility;Impaired sensation;Impaired tone;Increased muscle spasms;Decreased strength   Rehab Potential Good   PT Frequency 2x / week   PT Duration 8 weeks   PT Treatment/Interventions ADLs/Self Care Home Management;Electrical Stimulation;Functional mobility training;Therapeutic activities;Patient/family education;Passive range of motion;Wheelchair mobility training;Therapeutic exercise;DME Instruction;Gait training;Balance training;Neuromuscular re-education;Stair training;Aquatic Therapy;Orthotic Fit/Training   Consulted and Agree with Plan of Care Patient   Family Member Consulted sister        Problem List There are no active problems to display for this patient.   Yenifer Saccente, PT, MPT, GCS 12/06/2014, 11:57 AM  [facility] [address] [place], [address] Phone: [phone]   Fax:  [phone]  Name: Richard Bridges MRN: [id number] Date of Birth: 09/01/1975

## 2014-12-07 ENCOUNTER — Ambulatory Visit: Payer: PRIVATE HEALTH INSURANCE | Admitting: Rehabilitative and Restorative Service Providers"

## 2014-12-13 ENCOUNTER — Ambulatory Visit: Payer: PRIVATE HEALTH INSURANCE | Attending: Psychiatry | Admitting: Physical Therapy

## 2014-12-13 DIAGNOSIS — R269 Unspecified abnormalities of gait and mobility: Secondary | ICD-10-CM | POA: Diagnosis present

## 2014-12-13 DIAGNOSIS — Z7409 Other reduced mobility: Secondary | ICD-10-CM | POA: Diagnosis not present

## 2014-12-13 DIAGNOSIS — M6281 Muscle weakness (generalized): Secondary | ICD-10-CM | POA: Insufficient documentation

## 2014-12-13 NOTE — Therapy (Signed)
[facility] [address] [place], [address] Phone: [phone]   Fax:  [phone]  Physical Therapy Treatment  Patient Details  Name: Richard Bridges MRN: [id number] Date of Birth: 1975/05/01 No Data Recorded  Encounter Date: 12/13/2014      PT End of Session - 12/13/14 1659    Visit Number 52   Number of Visits 65   Date for PT Re-Evaluation 01/25/15   Authorization Type $35 copay, no visit limit   PT Start Time 1100   PT Stop Time 1145   PT Time Calculation (min) 45 min   Activity Tolerance Patient tolerated treatment well   Behavior During Therapy [facility] for tasks assessed/performed      Past Medical History  Diagnosis Date  . Fractures involving multiple body regions 04/14/2014    s/p multi-trauma with pelvic and spinal fractures    Past Surgical History  Procedure Laterality Date  . Spine surgery  04/15/2014    laminectomy s/p multi-trauma  . 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  Visit Diagnosis:  Decreased functional mobility and endurance  Generalized muscle weakness  Abnormality of gait      Subjective Assessment - 12/13/14 1654    Subjective Patient reports he is doing well. No concerns or questions at this time.    Patient is accompained by: Family member   Limitations Walking   Patient Stated Goals Return to walking   Currently in Pain? No/denies                     Adult Aquatic Therapy - 12/13/14 1655    Aquatic Therapy Subjective   Subjective Patient and family member report they dont feel like the new meds for tremors is helping.    Treatment   Gait Patient entered exited pool via ramp in water w/c with PT assist. Donned ankle weights in water this session to assist with walking and for increased proprioception.  Pt able to walk  along rail in pool, forward and sidestepping x 5 min each. Back ward walking x  1 min.  Pt demonstrates improved ability with gait wearing ankle weights.     Exercises Standing hip, hamstring, and calf stretching, hip adductor stretch,  hip abduction, hip extension stretch while floating on stomach. Low back stretch and hip flexor stretch while standing at rail.      Specific Exercises Hip/Low Back   Hip/Low Back Seated exercises to linclude laq, bicycles, hip abd/add x 3 min each sitting without back support for core strengthening while wearing ankle weights bilaterally.  Cues given for upright posture and to hold abdominal muscles tight.                      PT Education - 12/13/14 1657    Education provided Yes   Education Details proper form with stretching   Person(s) Educated Patient   Methods Explanation;Demonstration   Comprehension Verbalized understanding;Returned demonstration          PT Short Term Goals - 12/04/14 2226    PT SHORT TERM GOAL #1   Title The patient will report accessing 1/2 bathroom in home environment modified indep with RW.   Baseline Target date 12/27/2014   Time 4   Period Weeks   Status On-going   PT SHORT TERM GOAL #2   Title The patient will negotiate  household steps from standing position (vs. scooting on his bottom) with one handrail using sidestep technique with CGA for safety.   Baseline Target date 12/27/2014   Time 4   Period Weeks   Status On-going   PT SHORT TERM GOAL #3   Title The patient will ambulate x 450 ft nonstop with RW modified indep for improving limited community ambulation.   Baseline Target date 12/27/2014   Time 4   Period Weeks   Status On-going   PT SHORT TERM GOAL #4   Title The patient will negotiate 4 steps with bilateral handrails in step to pattern (anteriorly negotiating vs. sidestepping) in order to access community and friend/family homes.   Baseline Target date 12/27/2014   Time 4   Period Weeks   Status On-going   PT SHORT TERM GOAL #5   Title The patient will trial  rollater RW in order to use seat/tray to carry plate from countertop to table for improved independence in kitchen/during meals.   Baseline Target date 12/27/2014   Time 4   Period Weeks   Status On-going   PT SHORT TERM GOAL #6   Title The patient will improve gait speed from 0.76 ft/sec to > or equal to 1.0 ft/sec for improving functional mobility.   Baseline Target date 12/27/2014   Time 4   Period Weeks   Status On-going           PT Long Term Goals - 11/28/14 2203    PT LONG TERM GOAL #1   Title The patient will negotiate curbs/ramps with least restrictive device and supervision for return to limited community mobility.   Baseline Updated LTG target date as 01/25/2015   Time 8   Period Weeks   Status On-going   PT LONG TERM GOAL #2   Title The patient will verbalize understanding of community exercise routine for continued strengthening/endurance post d/c from PT   Baseline Updated LTG target date as 01/25/2015   Time 8   Period Weeks   Status On-going   PT LONG TERM GOAL #3   Title The patient will ambulate with least restrictive device modified indep for short community distances to improve independence for community appts/activities > 500 feet.   Baseline Updated LTG target date as 01/25/2015   Time 8   Period Weeks   Status On-going   PT LONG TERM GOAL #4   Title The patient will negotiate 16 steps (4 steps x 4 reps in clinic) with one handrail modified indep to improve access to home environment/bathroom.   Baseline Updated LTG target date as 01/25/2015   Time 8   Period Weeks   Status On-going   PT LONG TERM GOAL #5   Title The patient will move floor<>stand with UE support modified indep.   Baseline Target date 11/27/2014   Time 8   Period Weeks   Status Achieved   PT LONG TERM GOAL #6   Title The patient will increase gait speed from 0.59 ft/sec to > or equal to 1.3 ft/sec to demo transition to "limited community ambulator" classification of gait.    Baseline Updated LTG target date as 01/25/2015   Time 8   Period Weeks   Status On-going               Plan - 12/13/14 1659    Clinical Impression Statement Patient is becoming more independent with aquatic activities and will be ready to progress to community pool at discharge.  Pt will benefit from skilled therapeutic intervention in order to improve on the following deficits Abnormal gait;Decreased balance;Decreased mobility;Decreased activity tolerance;Decreased range of motion;Difficulty walking;Impaired flexibility;Impaired sensation;Impaired tone;Increased muscle spasms;Decreased strength   Rehab Potential Good   PT Frequency 2x / week   PT Duration 8 weeks   PT Treatment/Interventions ADLs/Self Care Home Management;Electrical Stimulation;Functional mobility training;Therapeutic activities;Patient/family education;Passive range of motion;Wheelchair mobility training;Therapeutic exercise;DME Instruction;Gait training;Balance training;Neuromuscular re-education;Stair training;Aquatic Therapy;Orthotic Fit/Training   Consulted and Agree with Plan of Care Patient   Family Member Consulted sister        Problem List There are no active problems to display for this patient.   Ibeth Fahmy, PT, MPT, GCS 12/13/2014, 5:02 PM  [facility] [address], [place], [address] Phone: [phone]   Fax:  [phone]  Name: Richard Bridges MRN: [id number] Date of Birth: [birth year]

## 2014-12-14 ENCOUNTER — Ambulatory Visit: Payer: PRIVATE HEALTH INSURANCE | Admitting: Rehabilitative and Restorative Service Providers"

## 2014-12-20 ENCOUNTER — Ambulatory Visit: Payer: PRIVATE HEALTH INSURANCE | Admitting: Physical Therapy

## 2014-12-20 DIAGNOSIS — M6281 Muscle weakness (generalized): Secondary | ICD-10-CM

## 2014-12-20 DIAGNOSIS — Z7409 Other reduced mobility: Secondary | ICD-10-CM | POA: Diagnosis not present

## 2014-12-20 DIAGNOSIS — R269 Unspecified abnormalities of gait and mobility: Secondary | ICD-10-CM

## 2014-12-20 NOTE — Therapy (Signed)
[facility] [address], [place], [address] Phone: [phone]   Fax:  [phone]  Physical Therapy Treatment  Patient Details  Name: Richard Bridges MRN: [id number] Date of Birth: [birth year] No Data Recorded  Encounter Date: 12/20/2014      PT End of Session - 12/20/14 1248    Visit Number 53   Number of Visits 59   Date for PT Re-Evaluation 01/25/15   Authorization Type $35 copay, no visit limit   PT Start Time 1100   PT Stop Time 1145   PT Time Calculation (min) 45 min   Activity Tolerance Patient tolerated treatment well   Behavior During Therapy [facility] for tasks assessed/performed      Past Medical History  Diagnosis Date  . Fractures involving multiple body regions 04/14/2014    s/p multi-trauma with pelvic and spinal fractures    Past Surgical History  Procedure Laterality Date  . Spine surgery  04/15/2014    laminectomy s/p multi-trauma  . 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  Visit Diagnosis:  Decreased functional mobility and endurance  Generalized muscle weakness  Abnormality of gait      Subjective Assessment - 12/20/14 1244    Subjective Patient reports he saw MD, but was not able to get Botox shot due to not having authorization. His medication was increased for tremors.   Patient is accompained by: Family member   Limitations Walking   Patient Stated Goals Return to walking   Currently in Pain? No/denies                     Adult Aquatic Therapy - 12/20/14 1245    Aquatic Therapy Subjective   Subjective Patient has increased dose of medication for tremors.    Treatment   Gait Patient entered exited pool via ramp in water w/c with PT assist. Donned ankle weights in water this session to assist with walking and for increased proprioception.  Pt able to walk  along rail in pool, forward and sidestepping x 5  min each. Back ward walking x 1 min.  Pt demonstrates improved ability with gait wearing ankle weights.     Exercises Standing hip, hamstring, and calf stretching, hip adductor stretch,  hip abduction, hip extension stretch while floating on stomach. Low back stretch and hip flexor stretch while standing at rail.      Specific Exercises Hip/Low Back   Hip/Low Back exercises performed while using edge of pool for support to include unweighted laq, marching, hip flexion. x 2 min each                    PT Education - 12/20/14 1247    Education provided Yes   Education Details stretches, how to perform certain activities depending on community pool equipment.   Person(s) Educated Patient   Methods Explanation;Demonstration;Verbal cues   Comprehension Verbalized understanding;Returned demonstration          PT Short Term Goals - 12/04/14 2226    PT SHORT TERM GOAL #1   Title The patient will report accessing 1/2 bathroom in home environment modified indep with RW.   Baseline Target date 12/27/2014   Time 4   Period Weeks   Status On-going   PT SHORT TERM GOAL #2   Title The patient will negotiate household steps from standing position (vs. scooting on  his bottom) with one handrail using sidestep technique with CGA for safety.   Baseline Target date 12/27/2014   Time 4   Period Weeks   Status On-going   PT SHORT TERM GOAL #3   Title The patient will ambulate x 450 ft nonstop with RW modified indep for improving limited community ambulation.   Baseline Target date 12/27/2014   Time 4   Period Weeks   Status On-going   PT SHORT TERM GOAL #4   Title The patient will negotiate 4 steps with bilateral handrails in step to pattern (anteriorly negotiating vs. sidestepping) in order to access community and friend/family homes.   Baseline Target date 12/27/2014   Time 4   Period Weeks   Status On-going   PT SHORT TERM GOAL #5   Title The patient will trial rollater RW in  order to use seat/tray to carry plate from countertop to table for improved independence in kitchen/during meals.   Baseline Target date 12/27/2014   Time 4   Period Weeks   Status On-going   PT SHORT TERM GOAL #6   Title The patient will improve gait speed from 0.76 ft/sec to > or equal to 1.0 ft/sec for improving functional mobility.   Baseline Target date 12/27/2014   Time 4   Period Weeks   Status On-going           PT Long Term Goals - 11/28/14 2203    PT LONG TERM GOAL #1   Title The patient will negotiate curbs/ramps with least restrictive device and supervision for return to limited community mobility.   Baseline Updated LTG target date as 01/25/2015   Time 8   Period Weeks   Status On-going   PT LONG TERM GOAL #2   Title The patient will verbalize understanding of community exercise routine for continued strengthening/endurance post d/c from PT   Baseline Updated LTG target date as 01/25/2015   Time 8   Period Weeks   Status On-going   PT LONG TERM GOAL #3   Title The patient will ambulate with least restrictive device modified indep for short community distances to improve independence for community appts/activities > 500 feet.   Baseline Updated LTG target date as 01/25/2015   Time 8   Period Weeks   Status On-going   PT LONG TERM GOAL #4   Title The patient will negotiate 16 steps (4 steps x 4 reps in clinic) with one handrail modified indep to improve access to home environment/bathroom.   Baseline Updated LTG target date as 01/25/2015   Time 8   Period Weeks   Status On-going   PT LONG TERM GOAL #5   Title The patient will move floor<>stand with UE support modified indep.   Baseline Target date 11/27/2014   Time 8   Period Weeks   Status Achieved   PT LONG TERM GOAL #6   Title The patient will increase gait speed from 0.59 ft/sec to > or equal to 1.3 ft/sec to demo transition to "limited community ambulator" classification of gait.   Baseline Updated  LTG target date as 01/25/2015   Time 8   Period Weeks   Status On-going               Plan - 12/20/14 1248    Clinical Impression Statement This was last session with me at [facility]. Patient is planning to continue at community pool. Patient is independent with activities in the pool at this time.  Pt will benefit from skilled therapeutic intervention in order to improve on the following deficits Abnormal gait;Decreased balance;Decreased mobility;Decreased activity tolerance;Decreased range of motion;Difficulty walking;Impaired flexibility;Impaired sensation;Impaired tone;Increased muscle spasms;Decreased strength   Rehab Potential Good   PT Frequency 2x / week   PT Duration 8 weeks   PT Treatment/Interventions ADLs/Self Care Home Management;Electrical Stimulation;Functional mobility training;Therapeutic activities;Patient/family education;Passive range of motion;Wheelchair mobility training;Therapeutic exercise;DME Instruction;Gait training;Balance training;Neuromuscular re-education;Stair training;Aquatic Therapy;Orthotic Fit/Training   Consulted and Agree with Plan of Care Patient        Problem List There are no active problems to display for this patient.   Odessia Asleson, PT, MPT, GCS 12/20/2014, 12:51 PM  [facility] [address] [place], [address] Phone: [phone]   Fax:  [phone]  Name: Richard Bridges MRN: [id number] Date of Birth: 03-18-1975

## 2014-12-21 ENCOUNTER — Ambulatory Visit: Payer: PRIVATE HEALTH INSURANCE | Attending: Psychiatry | Admitting: Rehabilitative and Restorative Service Providers"

## 2014-12-21 DIAGNOSIS — M6281 Muscle weakness (generalized): Secondary | ICD-10-CM | POA: Diagnosis present

## 2014-12-21 DIAGNOSIS — R269 Unspecified abnormalities of gait and mobility: Secondary | ICD-10-CM | POA: Insufficient documentation
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
## 2014-12-24 ENCOUNTER — Ambulatory Visit: Payer: PRIVATE HEALTH INSURANCE | Admitting: Rehabilitative and Restorative Service Providers"

## 2014-12-28 ENCOUNTER — Ambulatory Visit: Payer: PRIVATE HEALTH INSURANCE | Admitting: Physical Therapy

## 2014-12-31 ENCOUNTER — Ambulatory Visit: Payer: PRIVATE HEALTH INSURANCE | Admitting: Physical Therapy

## 2015-01-07 ENCOUNTER — Ambulatory Visit: Payer: PRIVATE HEALTH INSURANCE | Admitting: Rehabilitative and Restorative Service Providers"

## 2015-01-07 DIAGNOSIS — R269 Unspecified abnormalities of gait and mobility: Secondary | ICD-10-CM

## 2015-01-07 DIAGNOSIS — M6281 Muscle weakness (generalized): Secondary | ICD-10-CM

## 2015-01-07 NOTE — Patient Instructions (Signed)
GYM ROUTINE:  1) Seated stepper Nu-step or Sci-fit Begin 8-10 minutes emphasizing 2 different aspects (1- add resistance to build strength, 2- increase stairs climbed per minute) OR can try a recumbent bike (seated bike) if your feet will stay in pedal  2) Arm bike Begin with 6-8 minutes (can vary forward and backward) at comfortable pace  3) Free weights for arms   OR  4) weight stacks *Pulleys* can do upper body strengthening and lower body strengthening if there is an ankle cuff  -would need help to change the height of the pulley system  5) Leg press machine with 40-60 lbs  X 10 reps   POOL when you feel like you can tolerate the weather difference.
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
## 2015-01-11 ENCOUNTER — Ambulatory Visit: Payer: PRIVATE HEALTH INSURANCE | Admitting: Physical Therapy

## 2015-01-14 ENCOUNTER — Ambulatory Visit: Payer: PRIVATE HEALTH INSURANCE | Admitting: Rehabilitative and Restorative Service Providers"

## 2015-03-13 ENCOUNTER — Encounter: Payer: Self-pay | Admitting: Physical Medicine & Rehabilitation

## 2015-03-13 ENCOUNTER — Encounter
Payer: BLUE CROSS/BLUE SHIELD | Attending: Physical Medicine & Rehabilitation | Admitting: Physical Medicine & Rehabilitation

## 2015-03-13 VITALS — BP 137/73 | HR 114 | Resp 16

## 2015-03-13 DIAGNOSIS — S24109A Unspecified injury at unspecified level of thoracic spinal cord, initial encounter: Secondary | ICD-10-CM | POA: Insufficient documentation

## 2015-03-13 DIAGNOSIS — G8220 Paraplegia, unspecified: Secondary | ICD-10-CM | POA: Diagnosis not present

## 2015-03-13 DIAGNOSIS — S299XXA Unspecified injury of thorax, initial encounter: Secondary | ICD-10-CM | POA: Diagnosis not present

## 2015-03-13 DIAGNOSIS — N319 Neuromuscular dysfunction of bladder, unspecified: Secondary | ICD-10-CM | POA: Diagnosis not present

## 2015-03-13 DIAGNOSIS — IMO0002 Reserved for concepts with insufficient information to code with codable children: Secondary | ICD-10-CM

## 2015-03-13 DIAGNOSIS — R202 Paresthesia of skin: Secondary | ICD-10-CM | POA: Insufficient documentation

## 2015-03-13 DIAGNOSIS — S24109S Unspecified injury at unspecified level of thoracic spinal cord, sequela: Secondary | ICD-10-CM

## 2015-03-13 DIAGNOSIS — Z87820 Personal history of traumatic brain injury: Secondary | ICD-10-CM | POA: Diagnosis not present

## 2015-03-13 DIAGNOSIS — S069X9A Unspecified intracranial injury with loss of consciousness of unspecified duration, initial encounter: Secondary | ICD-10-CM | POA: Insufficient documentation

## 2015-03-13 DIAGNOSIS — G253 Myoclonus: Secondary | ICD-10-CM | POA: Insufficient documentation

## 2015-03-13 DIAGNOSIS — S069X9S Unspecified intracranial injury with loss of consciousness of unspecified duration, sequela: Secondary | ICD-10-CM

## 2015-03-13 DIAGNOSIS — G839 Paralytic syndrome, unspecified: Secondary | ICD-10-CM | POA: Insufficient documentation

## 2015-03-13 DIAGNOSIS — S069XAA Unspecified intracranial injury with loss of consciousness status unknown, initial encounter: Secondary | ICD-10-CM | POA: Insufficient documentation

## 2015-03-13 MED ORDER — CLONAZEPAM 0.5 MG PO TABS: 0.5000 mg | ORAL_TABLET | Freq: Two times a day (BID) | ORAL | Status: DC
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
## 2015-03-13 NOTE — Patient Instructions (Addendum)
PLEASE CALL ME WITH ANY PROBLEMS OR QUESTIONS [id number]).     KLONOPIN: TAKE 1 TABLET TWICE DAILY FOR 5 DAYS THEN 2 TWICE DAILY THEREAFTER.

## 2015-04-09 ENCOUNTER — Telehealth: Payer: Self-pay | Admitting: *Deleted

## 2015-04-09 NOTE — Telephone Encounter (Signed)
Richard Bridges's sister (listed on DPR) called and has some questions for Dr Naaman Plummer before [name]'s visit tomorrow.  She is asking if she can email or Dr Naaman Plummer call.  Dr Naaman Plummer is not in office this afternoon.  I said to send a message through myChart and we can forward it to him but otherwise we do not give out MD email address.  She will try that.

## 2015-04-09 NOTE — Telephone Encounter (Signed)
FYI

## 2015-04-10 ENCOUNTER — Encounter
Payer: BLUE CROSS/BLUE SHIELD | Attending: Physical Medicine & Rehabilitation | Admitting: Physical Medicine & Rehabilitation

## 2015-04-10 ENCOUNTER — Encounter: Payer: Self-pay | Admitting: Physical Medicine & Rehabilitation

## 2015-04-10 VITALS — BP 131/85 | HR 108

## 2015-04-10 DIAGNOSIS — IMO0002 Reserved for concepts with insufficient information to code with codable children: Secondary | ICD-10-CM

## 2015-04-10 DIAGNOSIS — S299XXA Unspecified injury of thorax, initial encounter: Secondary | ICD-10-CM | POA: Insufficient documentation

## 2015-04-10 DIAGNOSIS — G253 Myoclonus: Secondary | ICD-10-CM | POA: Insufficient documentation

## 2015-04-10 DIAGNOSIS — N319 Neuromuscular dysfunction of bladder, unspecified: Secondary | ICD-10-CM | POA: Diagnosis present

## 2015-04-10 DIAGNOSIS — S24109S Unspecified injury at unspecified level of thoracic spinal cord, sequela: Secondary | ICD-10-CM

## 2015-04-10 DIAGNOSIS — G8220 Paraplegia, unspecified: Secondary | ICD-10-CM | POA: Insufficient documentation

## 2015-04-10 DIAGNOSIS — R202 Paresthesia of skin: Secondary | ICD-10-CM | POA: Diagnosis not present

## 2015-04-10 DIAGNOSIS — Z87820 Personal history of traumatic brain injury: Secondary | ICD-10-CM | POA: Insufficient documentation

## 2015-04-10 DIAGNOSIS — S069X5S Unspecified intracranial injury with loss of consciousness greater than 24 hours with return to pre-existing conscious level, sequela: Secondary | ICD-10-CM

## 2015-04-10 MED ORDER — CLONAZEPAM 1 MG PO TABS: 1.0000 mg | ORAL_TABLET | Freq: Four times a day (QID) | ORAL | Status: DC

## 2015-04-10 NOTE — Patient Instructions (Signed)
KLONOPIN  TAKE 1 TABLET THREE X DAILY FOR 4 DAYS, THEN INCREASE TO 1 TABLET TWICE DAILY AND 2 TABLETS AT NIGHT THEREAFTER.   IF YOU ARE TOLERATING THE MEDICINE BUT IT'S NOT EFFECTIVE CALL MY OFFICE AND WE WILL PROVIDE FURTHER INSTRUCTIONS.

## 2015-04-10 NOTE — Progress Notes (Signed)
Subjective:    Patient ID: Richard Bridges, male    DOB: [birth year], 40 y.o.   MRN: [id number]  HPI   [name] is here in follow up of his of his SCI and TBI. He continues to "shake" epecially on the right side. He has stiffness at night.  The klonopin seems to have helped his stiffness but not his myoclonus. He lost a friend in a car accident recently and his family feels that he's more depressed. The patient himself denies depression. He hasn't been as active as previoulsy however.   He is going to the [facility] and working with a Comptroller. He hasn't had any new PT  Pain Inventory Average Pain 0 Pain Right Now 0 My pain is no pain  In the last 24 hours, has pain interfered with the following? General activity 0 Relation with others 0 Enjoyment of life 0 What TIME of day is your pain at its worst? night Sleep (in general) Fair  Pain is worse with: no pain Pain improves with: no pain Relief from Meds: 9  Mobility use a walker how many minutes can you walk? 5 ability to climb steps?  yes do you drive?  no  Function disabled: date disabled 2016  Neuro/Psych tremor  Prior Studies Any changes since last visit?  no  Physicians involved in your care Any changes since last visit?  no   History reviewed. No pertinent family history. Social History   Social History  . Marital Status: Single    Spouse Name: N/A  . Number of Children: N/A  . Years of Education: N/A   Social History Main Topics  . Smoking status: Former Research scientist (life sciences)  . Smokeless tobacco: None  . Alcohol Use: Yes  . Drug Use: No  . Sexual Activity: Not Asked   Other Topics Concern  . None   Social History Narrative   Past Surgical History  Procedure Laterality Date  . Spine surgery  04/15/2014    laminectomy s/p multi-trauma  . 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 Fractures involving multiple body regions 04/14/2014    s/p multi-trauma with  pelvic and spinal fractures   BP 131/85 mmHg  Pulse 108  SpO2 100%  Opioid Risk Score:   Fall Risk Score:  `1  Depression screen PHQ 2/9  Depression screen [facility] 2/9 04/10/2015 03/13/2015  Decreased Interest 0 0  Down, Depressed, Hopeless 0 0  PHQ - 2 Score 0 0  Altered sleeping - 0  Tired, decreased energy - 0  Change in appetite - 0  Feeling bad or failure about yourself  - 0  Trouble concentrating - 0  Moving slowly or fidgety/restless - 0  Suicidal thoughts - 0  PHQ-9 Score - 0     Review of Systems  All other systems reviewed and are negative.      Objective:   Physical Exam  General: Alert and oriented x 3, No apparent distress  HEENT: Head is normocephalic, atraumatic, PERRLA, EOMI, sclera anicteric, oral mucosa pink and moist, dentition intact, ext ear canals clear  Neck: Supple without JVD or lymphadenopathy  Heart: Reg rate and rhythm. No murmurs rubs or gallops  Chest: CTA bilaterally without wheezes, rales, or rhonchi; no distress  Abdomen: Soft, non-tender, non-distended, bowel sounds positive.  Extremities: No clubbing, cyanosis, or edema. Pulses are 2+  Skin: Clean and intact without signs of breakdown  Neuro: Pt is cognitively appropriate  with normal insight, memory, and awareness. Cranial nerves 2-12 are intact. Sensory exam is diminished to LT/pain in both legs below inguinal region. . Reflexes are 3+ in the lower exts. Fine motor coordination is intact. No tremors. Motor function is grossly 5/5 in both upper limbs. LE: R--HF 3+, KE + to 4-, ADF/APF 4-; LLE: 3+ hf, 4/5 ADF/PF. He developed tightness in both legs when i asked him to relax, but when challenged to volitionally move legs, he was easily able to do so with minimal resistance. He has sustained clonus in both legs.  He walks with a brace on the right ankle and none onleft. He uses a steppage pattern to clear the left leg during gait. Both feet/toes tend to drag quite a bit.  Musculoskeletal: Full ROM,  No pain with AROM or PROM in the neck, trunk, or extremities. Posture appropriate  Psych: Pt's affect is appropriate. Pt is cooperative   Assessment & Plan:   1. 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  2. 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 Continue ROM and strengthening. He needs to focus on quality not quantity. Will send to [facility] for bilateral kick plates 2. Klonopin for myoclonus and resting tone increase to 1mg  TID and ultimately to 1-1-2mg  schedule. Asked for phone call in acouple weeks to determine need for adjustment of dose or addition of a new medicaiton (propranolol?) 3. Consider botox to hamstrings/quads,although I'm not sure how effective this will be 4. Urological follow up per Dr. Alyson Ingles.  5. Follow up with me in about a month. 25 minutes of face to face patient care time were spent during this visit. All questions were encouraged and answered.

## 2015-05-06 ENCOUNTER — Encounter: Payer: Self-pay | Admitting: Rehabilitative and Restorative Service Providers"

## 2015-05-06 NOTE — Therapy (Signed)
[facility] [address] [place], [place], [address] Phone: [phone]   Fax:  [phone]  Patient Details  Name: Richard Bridges MRN: [id number] Date of Birth: [birth year] Referring Provider:  No ref. provider found  Encounter Date: last encounter 01/07/2016  PHYSICAL THERAPY DISCHARGE SUMMARY  Visits from Start of Care: 55  Current functional level related to goals / functional outcomes:     PT Short Term Goals - 01/08/15 1116    PT SHORT TERM GOAL #1   Title The patient will report accessing 1/2 bathroom in home environment modified indep with RW.   Baseline Met per report on 01/07/2015   Time 4   Period Weeks   Status Achieved   PT SHORT TERM GOAL #2   Title The patient will negotiate household steps from standing position (vs. scooting on his bottom) with one handrail using sidestep technique with CGA for safety.   Baseline The patient is boosting up the steps on his bottom.   He does not plan to negotiate steps standing due to needing to donn braces and he is going upstairs to shower (would have to remove braces).   Time 4   Period Weeks   Status Deferred   PT SHORT TERM GOAL #3   Title The patient will ambulate x 450 ft nonstop with RW modified indep for improving limited community ambulation.   Baseline Pt has increased distance to 330 ft x 2 reps with RW mod indep.   Time 4   Period Weeks   Status Partially Met   PT SHORT TERM GOAL #4   Title The patient will negotiate 4 steps with bilateral handrails in step to pattern (anteriorly negotiating vs. sidestepping) in order to access community and friend/family homes.   Baseline Met on 12/21/2014   Time 4   Period Weeks   Status Achieved   PT SHORT TERM GOAL #5   Title The patient will trial rollater RW in order to use seat/tray to carry plate from countertop to table for improved independence in kitchen/during meals.   Baseline Patient did not feel  safe with swivel wheels on rollater- feels more confident with standard/fixed wheels on standard RW   Time 4   Period Weeks   Status Achieved   PT SHORT TERM GOAL #6   Title The patient will improve gait speed from 0.76 ft/sec to > or equal to 1.0 ft/sec for improving functional mobility.   Baseline Last gait speed measurement 0.76 ft/sec.   Time 4   Period Weeks   Status Not Met         PT Long Term Goals - 01/08/15 1118    PT LONG TERM GOAL #1   Title The patient will negotiate curbs/ramps with least restrictive device and supervision for return to limited community mobility.   Baseline Updated LTG target date as 01/25/2015   Time 8   Period Weeks   Status On-going   PT LONG TERM GOAL #2   Title The patient will verbalize understanding of community exercise routine for continued strengthening/endurance post d/c from PT   Baseline Met on 01/08/2015 with both pool activities and gym routine discussed/educated.   Time 8   Period Weeks   Status Achieved   PT LONG TERM GOAL #3   Title The patient will ambulate with least restrictive device modified indep for short community distances to improve independence for community appts/activities > 500 feet.   Baseline Updated LTG target date  as 01/25/2015   Time 8   Period Weeks   Status On-going   PT LONG TERM GOAL #4   Title The patient will negotiate 16 steps (4 steps x 4 reps in clinic) with one handrail modified indep to improve access to home environment/bathroom.   Baseline Updated LTG target date as 01/25/2015   Time 8   Period Weeks   Status On-going   PT LONG TERM GOAL #5   Title The patient will move floor<>stand with UE support modified indep.   Baseline Target date 11/27/2014   Time 8   Period Weeks   Status Achieved   PT LONG TERM GOAL #6   Title The patient will increase gait speed from 0.59 ft/sec to > or equal to 1.0 ft/sec to demo transition to "limited community ambulator" classification of gait.   Baseline  Updated LTG target date as 01/25/2015   Time 8   Period Weeks   Status Revised     *PT held further visits until further tone management occurred.  Patient switched MD's to local/[place] rehab specialist.  PT discharge at this time.   Remaining deficits: Chronic deficits related to SCI, TBI including: LE weakness, spasticity, and general deconditioning.   Education / Equipment: HEP, gym routine, safety.  Plan: Patient agrees to discharge.  Patient goals were partially met. Patient is being discharged due to meeting the stated rehab goals.  ?????     Thank you for the referral of this patient. Rudell Cobb, MPT     Pheonix Clinkscale 05/06/2015, 9:17 AM  [facility] [address] [place], [place], [address] Phone: [phone]   Fax:  [phone]

## 2015-05-07 ENCOUNTER — Telehealth: Payer: Self-pay | Admitting: *Deleted

## 2015-05-07 NOTE — Telephone Encounter (Signed)
81, Godric's sister calling to see if we will prescribe tramadol for him.  It has previously been prescribed by Dr Bella Kennedy.  Last Rx was filled for #30 on 04/12/15. (he has appt 05/13/15 Monday))

## 2015-05-08 MED ORDER — TRAMADOL HCL 50 MG PO TABS: 50.0000 mg | ORAL_TABLET | Freq: Every day | ORAL | Status: DC

## 2015-05-08 NOTE — Telephone Encounter (Signed)
Called to pharmacy and Patty notified.

## 2015-05-08 NOTE — Telephone Encounter (Signed)
Can fill #30, one qhs, 0RF

## 2015-05-13 ENCOUNTER — Encounter: Payer: Self-pay | Admitting: Physical Medicine & Rehabilitation

## 2015-05-13 ENCOUNTER — Encounter
Payer: BLUE CROSS/BLUE SHIELD | Attending: Physical Medicine & Rehabilitation | Admitting: Physical Medicine & Rehabilitation

## 2015-05-13 VITALS — BP 149/91 | HR 99 | Resp 14

## 2015-05-13 DIAGNOSIS — S069X0S Unspecified intracranial injury without loss of consciousness, sequela: Secondary | ICD-10-CM | POA: Diagnosis not present

## 2015-05-13 DIAGNOSIS — G8220 Paraplegia, unspecified: Secondary | ICD-10-CM | POA: Insufficient documentation

## 2015-05-13 DIAGNOSIS — S299XXA Unspecified injury of thorax, initial encounter: Secondary | ICD-10-CM | POA: Diagnosis not present

## 2015-05-13 DIAGNOSIS — G253 Myoclonus: Secondary | ICD-10-CM | POA: Diagnosis not present

## 2015-05-13 DIAGNOSIS — Z87820 Personal history of traumatic brain injury: Secondary | ICD-10-CM | POA: Insufficient documentation

## 2015-05-13 DIAGNOSIS — S24109S Unspecified injury at unspecified level of thoracic spinal cord, sequela: Secondary | ICD-10-CM | POA: Diagnosis not present

## 2015-05-13 DIAGNOSIS — R202 Paresthesia of skin: Secondary | ICD-10-CM | POA: Insufficient documentation

## 2015-05-13 DIAGNOSIS — IMO0002 Reserved for concepts with insufficient information to code with codable children: Secondary | ICD-10-CM

## 2015-05-13 DIAGNOSIS — N319 Neuromuscular dysfunction of bladder, unspecified: Secondary | ICD-10-CM | POA: Insufficient documentation

## 2015-05-13 NOTE — Patient Instructions (Signed)
  PLEASE CALL ME WITH ANY PROBLEMS OR QUESTIONS (#[phone]).      

## 2015-05-13 NOTE — Progress Notes (Signed)
Subjective:    Patient ID: Richard Bridges, male    DOB: Jun 18, 1975, 40 y.o.   MRN: [id number]  HPI  Richard Bridges is here in follow up of his thoracic SCI/BI. His myoclonus has been better. He has been able to walk more and exercise better.   The myoclonus is better with the klonopin. He's taking 1mg  bid and 2mg  QHS. He's working out at [employer] and doing exercise at home as well.    He's also taking tramadol prn for more severe pain at night.   Pain Inventory Average Pain 2 Pain Right Now 0 My pain is no selection  In the last 24 hours, has pain interfered with the following? General activity 0 Relation with others 0 Enjoyment of life 0 What TIME of day is your pain at its worst? night Sleep (in general) Good  Pain is worse with: no selection Pain improves with: no selection Relief from Meds: no selection  Mobility walk with assistance how many minutes can you walk? 2 ability to climb steps?  yes do you drive?  no use a wheelchair Do you have any goals in this area?  yes  Function Do you have any goals in this area?  yes  Neuro/Psych tingling  Prior Studies Any changes since last visit?  no  Physicians involved in your care Any changes since last visit?  no   History reviewed. No pertinent family history. Social History   Social History  . Marital Status: Single    Spouse Name: N/A  . Number of Children: N/A  . Years of Education: N/A   Social History Main Topics  . Smoking status: Former Research scientist (life sciences)  . Smokeless tobacco: None  . Alcohol Use: Yes  . Drug Use: No  . Sexual Activity: Not Asked   Other Topics Concern  . None   Social History Narrative   Past Surgical History  Procedure Laterality Date  . Spine surgery  04/15/2014    laminectomy s/p multi-trauma  . 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 Fractures involving multiple body regions 04/14/2014    s/p multi-trauma with pelvic and spinal fractures     BP 149/91 mmHg  Pulse 99  Resp 14  SpO2 99%  Opioid Risk Score:   Fall Risk Score:  `1  Depression screen PHQ 2/9  Depression screen [facility] 2/9 04/10/2015 03/13/2015  Decreased Interest 0 0  Down, Depressed, Hopeless 0 0  PHQ - 2 Score 0 0  Altered sleeping - 0  Tired, decreased energy - 0  Change in appetite - 0  Feeling bad or failure about yourself  - 0  Trouble concentrating - 0  Moving slowly or fidgety/restless - 0  Suicidal thoughts - 0  PHQ-9 Score - 0     Review of Systems  Cardiovascular: Positive for leg swelling.  All other systems reviewed and are negative.      Objective:   Physical Exam  General: Alert and oriented x 3, No apparent distress  HEENT: Head is normocephalic, atraumatic, PERRLA, EOMI, sclera anicteric, oral mucosa pink and moist, dentition intact, ext ear canals clear  Neck: Supple without JVD or lymphadenopathy  Heart: Reg rate and rhythm. No murmurs rubs or gallops  Chest: CTA bilaterally without wheezes, rales, or rhonchi; no distress  Abdomen: Soft, non-tender, non-distended, bowel sounds positive.  Extremities: No clubbing, cyanosis, or edema. Pulses are 2+  Skin: Clean and intact without signs  of breakdown  Neuro: Pt is cognitively appropriate with normal insight, memory, and awareness. Cranial nerves 2-12 are intact. Sensory exam is diminished to LT/pain in both legs below inguinal region. . Reflexes are 3+ in the lower exts. Fine motor coordination is intact. No tremors. Motor function is grossly 5/5 in both upper limbs. LE: R--HF 3+, KE + to 4-, ADF/APF 4-; LLE: 3+ hf, 4/5 ADF/PF. He developed tightness in both legs when i asked him to relax, but when challenged to volitionally move legs, he was easily able to do so with minimal resistance. He has sustained clonus in Right leg only today. No clonus on left.  Both feet/toes tend to drag quite a bit.  Musculoskeletal: Full ROM, No pain with AROM or PROM in the neck, trunk, or extremities.  Posture appropriate  Psych: Pt's affect is appropriate. Pt is cooperative   Assessment & Plan:   1. 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  2. 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 Continue ROM and strengthening. He needs to focus on quality not quantity. Will send to [facility] for bilateral kick plates  2. Klonopin for myoclonus and resting tone increase to 1mg  TID and ultimately to 1-1-2mg  schedule. Consider increase to 2mg  TID if needed (Also Propranolol as back up)  3. Will hold off on botox at this point   4. Urological follow up per Dr. Alyson Ingles.  5. Follow up with me in about 3 months. 15 minutes of face to face patient care time were spent during this visit. All questions were encouraged and answered.

## 2015-06-11 ENCOUNTER — Telehealth: Payer: Self-pay | Admitting: *Deleted

## 2015-06-11 MED ORDER — TRAMADOL HCL 50 MG PO TABS
50.0000 mg | ORAL_TABLET | Freq: Every day | ORAL | Status: DC
Start: 2015-06-11 — End: 2015-09-02

## 2015-06-11 NOTE — Telephone Encounter (Signed)
[name] has called for a refill on Richard Bridges's tramadol.  Is it ok for [place] to refill?  You only gave him #30 last time with no refill.  No mention in last note.

## 2015-06-11 NOTE — Telephone Encounter (Addendum)
Per Dr Naaman Plummer it is ok to refill.  His next appt is 09/02/15 . Will refill to next appt. (One month supply) #30 2 RF.  Sister Patty notified.

## 2015-09-02 ENCOUNTER — Encounter: Payer: Self-pay | Admitting: Physical Medicine & Rehabilitation

## 2015-09-02 ENCOUNTER — Encounter
Payer: BLUE CROSS/BLUE SHIELD | Attending: Physical Medicine & Rehabilitation | Admitting: Physical Medicine & Rehabilitation

## 2015-09-02 VITALS — BP 125/81 | HR 74

## 2015-09-02 DIAGNOSIS — G253 Myoclonus: Secondary | ICD-10-CM | POA: Insufficient documentation

## 2015-09-02 DIAGNOSIS — Z87820 Personal history of traumatic brain injury: Secondary | ICD-10-CM | POA: Diagnosis not present

## 2015-09-02 DIAGNOSIS — Z5189 Encounter for other specified aftercare: Secondary | ICD-10-CM | POA: Insufficient documentation

## 2015-09-02 DIAGNOSIS — M961 Postlaminectomy syndrome, not elsewhere classified: Secondary | ICD-10-CM | POA: Insufficient documentation

## 2015-09-02 DIAGNOSIS — S24109S Unspecified injury at unspecified level of thoracic spinal cord, sequela: Secondary | ICD-10-CM | POA: Diagnosis not present

## 2015-09-02 DIAGNOSIS — G8220 Paraplegia, unspecified: Secondary | ICD-10-CM | POA: Diagnosis not present

## 2015-09-02 DIAGNOSIS — S24109D Unspecified injury at unspecified level of thoracic spinal cord, subsequent encounter: Secondary | ICD-10-CM | POA: Insufficient documentation

## 2015-09-02 DIAGNOSIS — IMO0002 Reserved for concepts with insufficient information to code with codable children: Secondary | ICD-10-CM

## 2015-09-02 DIAGNOSIS — S069X5S Unspecified intracranial injury with loss of consciousness greater than 24 hours with return to pre-existing conscious level, sequela: Secondary | ICD-10-CM

## 2015-09-02 DIAGNOSIS — S069X2S Unspecified intracranial injury with loss of consciousness of 31 minutes to 59 minutes, sequela: Secondary | ICD-10-CM

## 2015-09-02 DIAGNOSIS — N319 Neuromuscular dysfunction of bladder, unspecified: Secondary | ICD-10-CM | POA: Diagnosis not present

## 2015-09-02 MED ORDER — CLONAZEPAM 1 MG PO TABS: 1.0000 mg | ORAL_TABLET | Freq: Two times a day (BID) | ORAL | 4 refills | Status: DC

## 2015-09-02 MED ORDER — CLONAZEPAM 1 MG PO TABS: 1.0000 mg | ORAL_TABLET | Freq: Four times a day (QID) | ORAL | 4 refills | Status: DC

## 2015-09-02 MED ORDER — TRAMADOL HCL 50 MG PO TABS: 50.0000 mg | ORAL_TABLET | Freq: Every day | ORAL | 2 refills | Status: DC

## 2015-09-02 NOTE — Patient Instructions (Signed)
WATER WALKING!!

## 2015-09-02 NOTE — Progress Notes (Signed)
Subjective:    Patient ID: Richard Bridges, male    DOB: 1975-11-11, 40 y.o.   MRN: [id number]  HPI   Richard Bridges is here in follow up of his clonus and paraplegia.   He went to urology this spring for Urodynamic testing when mild hydronephrosis was seen on U/S. Urodynamic testing was normal.   His clonus is improving. He even backed off medication due to acne from the klonopin. The additional exercising and stretching he's doing with a trainer really has helped. He's walking around the house daily. He's joined the [employer]. He is taking the klonopin bid only now. He is using ultram at night to help with pain in his legs. He uses a right AFO and left air cast to help with support when standing/walking.      Pain Inventory Average Pain 0 Pain Right Now 0 My pain is none  In the last 24 hours, has pain interfered with the following? General activity 0 Relation with others 0 Enjoyment of life 0 What TIME of day is your pain at its worst? night Sleep (in general) Good  Pain is worse with: none Pain improves with: none Relief from Meds: none  Mobility walk with assistance use a walker ability to climb steps?  yes do you drive?  no use a wheelchair transfers alone  Function Do you have any goals in this area?  yes  Neuro/Psych No problems in this area  Prior Studies Any changes since last visit?  no  Physicians involved in your care Any changes since last visit?  no   History reviewed. No pertinent family history. Social History   Social History  . Marital status: Single    Spouse name: N/A  . Number of children: N/A  . Years of education: N/A   Social History Main Topics  . Smoking status: Former Research scientist (life sciences)  . Smokeless tobacco: Never Used  . Alcohol use Yes  . Drug use: No  . Sexual activity: Not Asked   Other Topics Concern  . None   Social History Narrative  . None   Past Surgical History:  Procedure Laterality Date  . SPINE SURGERY  04/15/2014   laminectomy s/p multi-trauma  . 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 Fractures involving multiple body regions 04/14/2014   s/p multi-trauma with pelvic and spinal fractures   There were no vitals taken for this visit.  Opioid Risk Score:   Fall Risk Score:  `1  Depression screen PHQ 2/9  Depression screen [facility] 2/9 04/10/2015 03/13/2015  Decreased Interest 0 0  Down, Depressed, Hopeless 0 0  PHQ - 2 Score 0 0  Altered sleeping - 0  Tired, decreased energy - 0  Change in appetite - 0  Feeling bad or failure about yourself  - 0  Trouble concentrating - 0  Moving slowly or fidgety/restless - 0  Suicidal thoughts - 0  PHQ-9 Score - 0    Review of Systems  Constitutional: Negative.   HENT: Negative.   Eyes: Negative.   Respiratory: Negative.   Cardiovascular: Negative.   Gastrointestinal: Negative.   Endocrine: Negative.   Genitourinary: Negative.   Musculoskeletal: Negative.   Skin: Negative.   Allergic/Immunologic: Negative.   Neurological: Negative.   Hematological: Negative.   Psychiatric/Behavioral: Negative.        Objective:   Physical Exam     General: Alert and oriented x 3, No apparent distress  HEENT:  Head is normocephalic, atraumatic, PERRLA, EOMI, sclera anicteric, oral mucosa pink and moist, dentition intact, ext ear canals clear  Neck: Supple without JVD or lymphadenopathy  Heart: Reg rate and rhythm. No murmurs rubs or gallops  Chest: CTA bilaterally without wheezes, rales, or rhonchi; no distress  Abdomen: Soft, non-tender, non-distended, bowel sounds positive.  Extremities: No clubbing, cyanosis, or edema. Pulses are 2+  Skin: Clean and intact without signs of breakdown  Neuro: Pt is cognitively appropriate with normal insight, memory, and awareness. Cranial nerves 2-12 are intact. Sensory exam is diminished to LT/pain in both legs below inguinal region. . Reflexes are 3+ in the lower exts. Fine motor  coordination is intact. No tremors. Motor function is grossly 5/5 in both upper limbs. LE: R--HF 3+, KE + to 4-, ADF/APF 4-; LLE: 3+ hf, 4/5 ADF/PF. Right sided clonus present but stops easily with re-positioning. No clonus on left.  Both heel cords tight but reducible. He did not stand for me.  Musculoskeletal: Full ROM, No pain with AROM or PROM in the neck, trunk, or extremities. Posture appropriate  Psych: Pt's affect is appropriate. Pt is cooperative   Assessment & Plan:   1. 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  2. 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 Maintain HEP as he's doing. He is doing a great job at present. Water walking would also be helpful to imporove gait mechanics and strength 2. Klonopin for myoclonus and resting tone continue at 1mg  BID  schedule.  3. Refilled ultram which he takes at bedtime. 4. Urological follow up per Dr. Alyson Ingles.  5. Follow up with me in about 4 months. 15 minutes of face to face patient care time were spent during this visit. All questions were encouraged and answered.

## 2015-09-06 IMAGING — US US ART/VEN ABD/PELV/SCROTUM DOPPLER LTD
1 series · 13 of 25 positions shown · non-contrast
Comparison: None.

CLINICAL DATA: 38-year-old male with scrotal swelling on the right
for 1 day. Initial encounter.

The patient self caths his bladder.  Personal history of UTI.
EXAM:
SCROTAL ULTRASOUND
DOPPLER ULTRASOUND OF THE TESTICLES
TECHNIQUE: Complete ultrasound examination of the testicles, epididymis, and
other scrotal structures was performed. Color and spectral Doppler
ultrasound were also utilized to evaluate blood flow to the
testicles.

[Series 1: us art/ven abd/pelv/scrotum doppler ltd · 0.06mm/px · 13 of 63 slices shown]
[im 1/63]
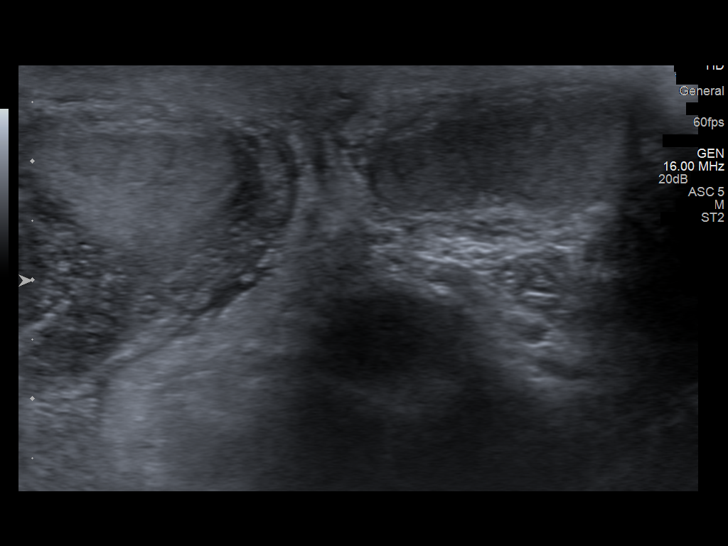
[im 6/63]
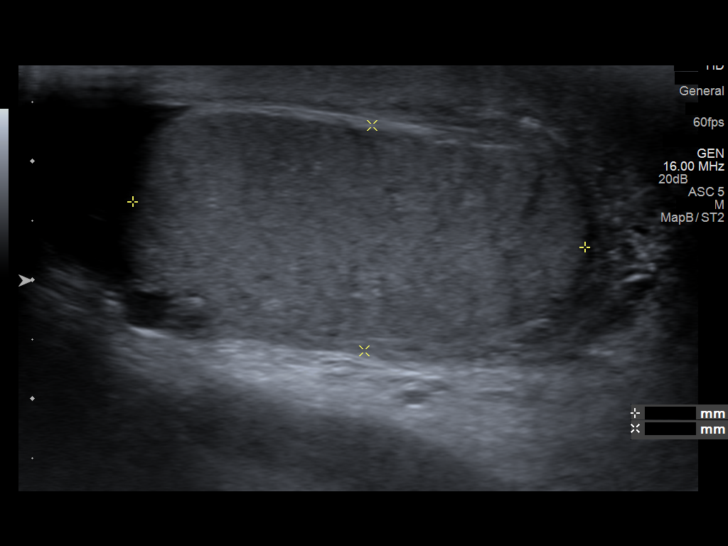
[im 11/63]
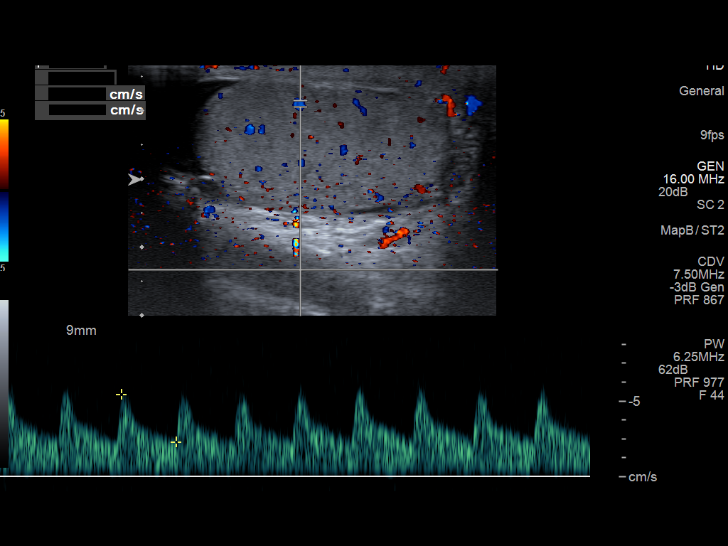
[im 16/63]
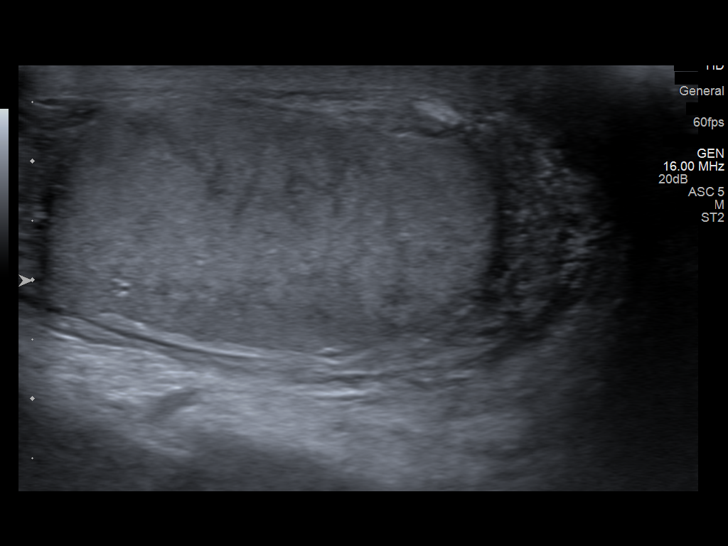
[im 21/63]
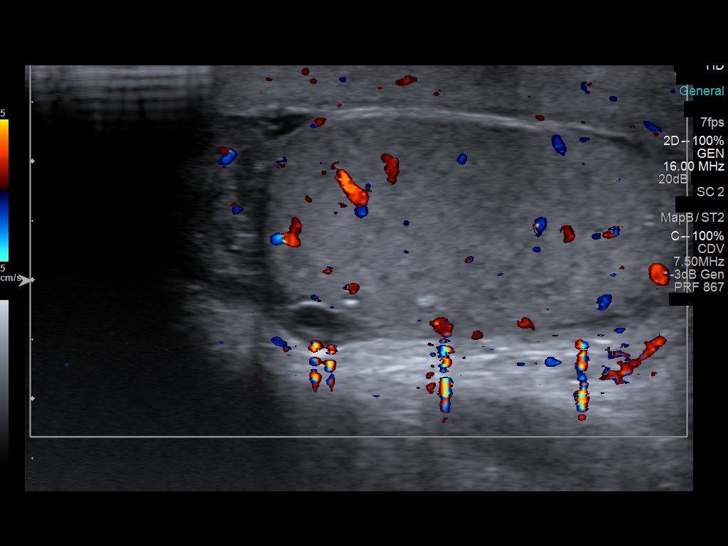
[im 26/63]
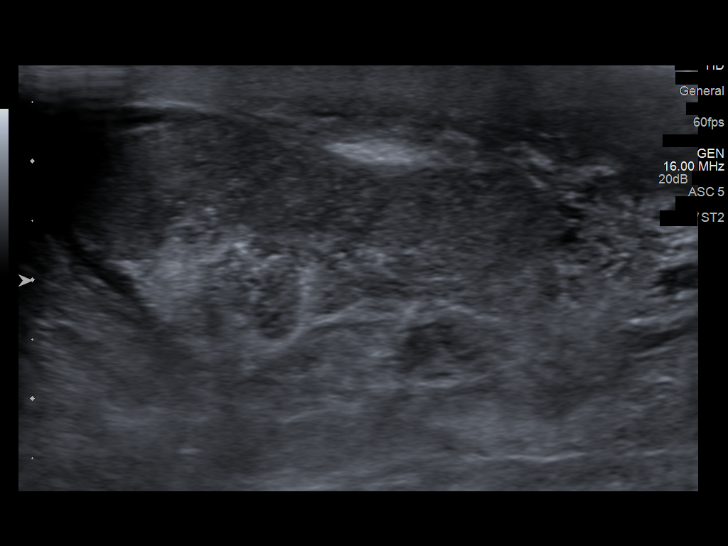
[im 32/63]
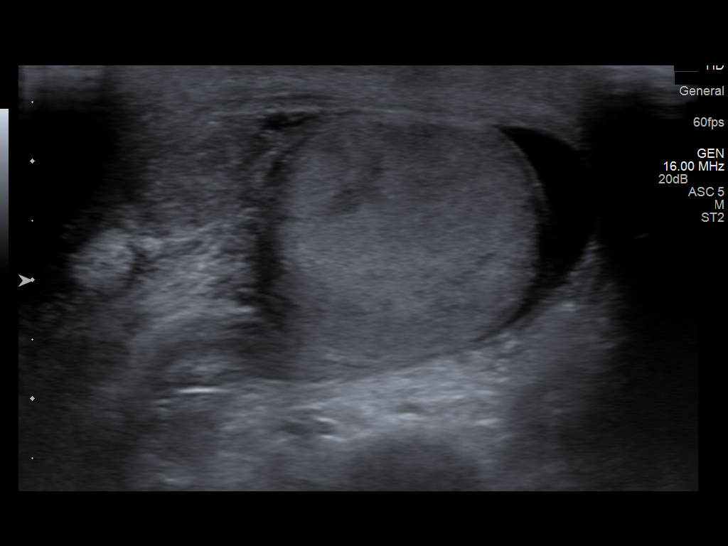
[im 37/63]
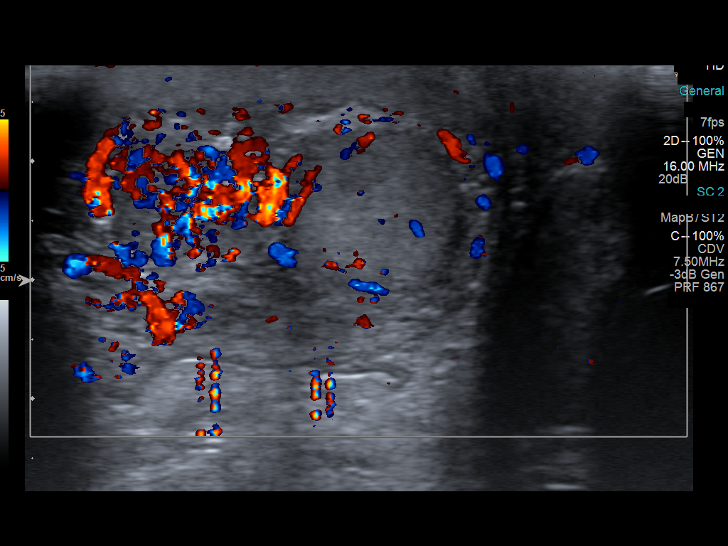
[im 42/63]
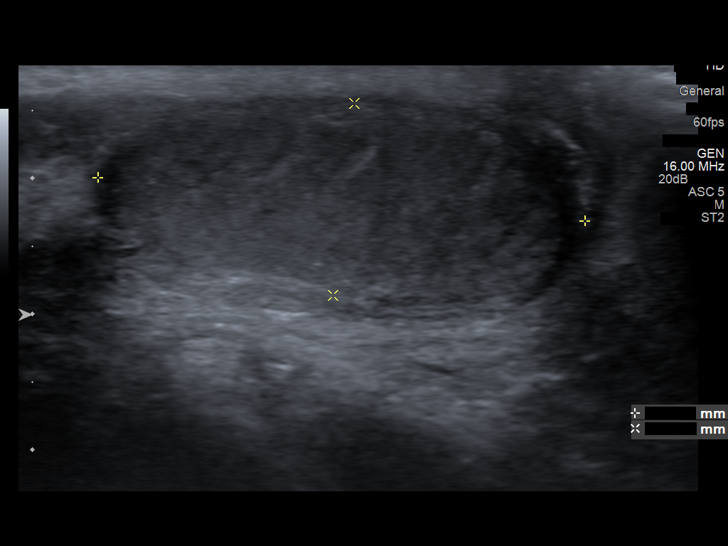
[im 47/63]
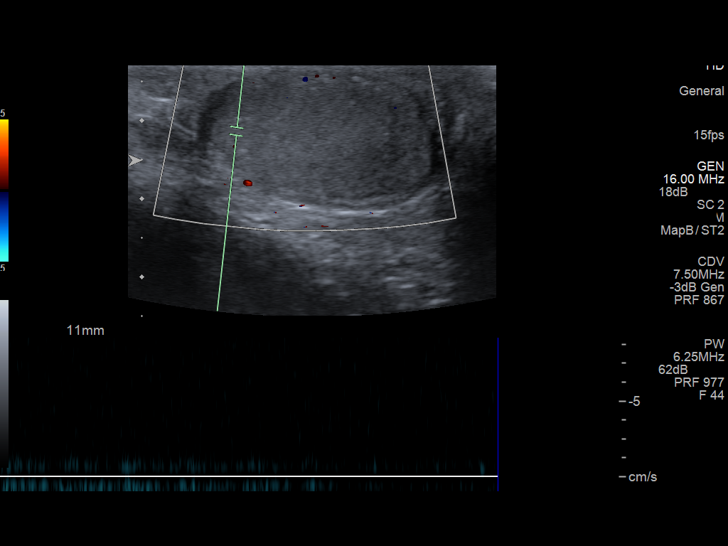
[im 52/63]
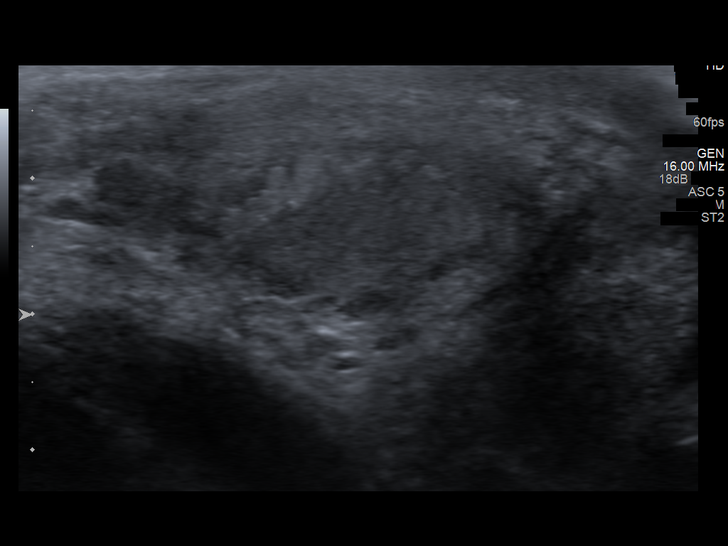
[im 57/63]
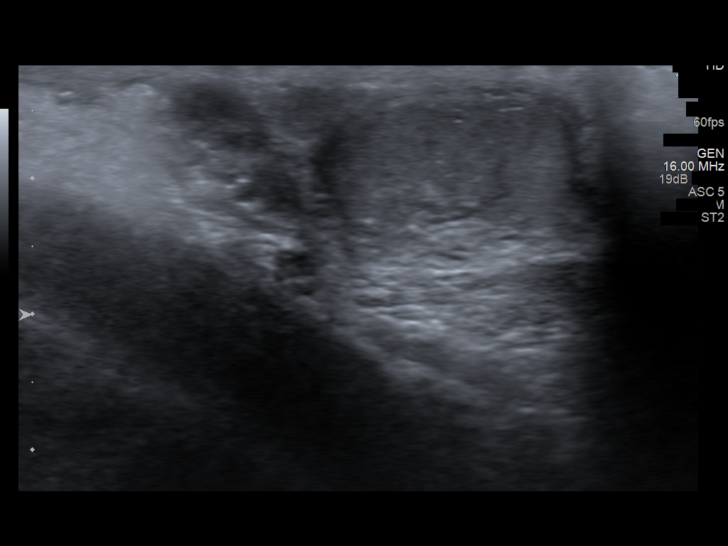
[im 63/63]
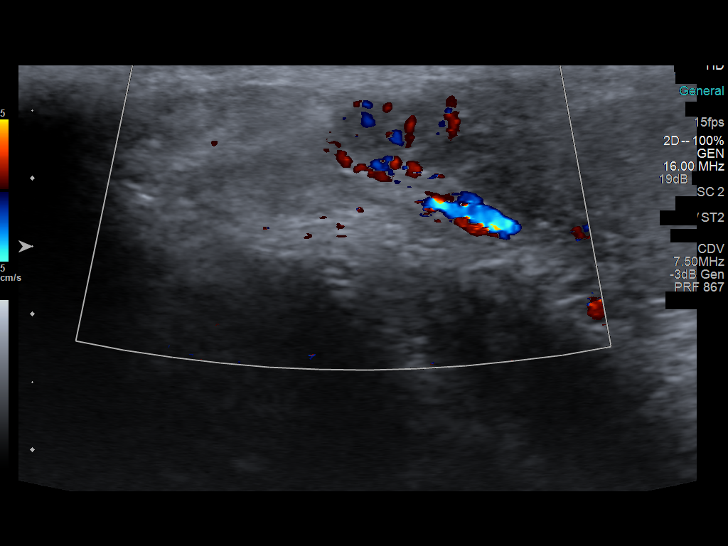

[13 of 25 positions shown; findings below may reference images not displayed]

FINDINGS: Right testicle

Measurements: 3.8 x 1.9 x 2.2 cm. Small probable tunica albuginea
cysts measuring 5 mm (image 24), inconsequential. No mass or
microlithiasis visualized.

Left testicle

Measurements: 3.6 x 1.4 x 1.8 cm. No mass or microlithiasis
visualized.

Right epididymis:  Heterogeneous and hypervascular (images 26-28).

Left epididymis:  Normal in size and appearance.

Hydrocele:  Small simple appearing right hydrocele.

Varicocele:  None visualized.

Pulsed Doppler interrogation of both testes demonstrates low
resistance arterial and venous waveforms bilaterally. The right
testis appears moderately hypervascular compared to the left.
IMPRESSION: 1. Acute right epididymo-orchitis: Heterogeneous enlargement and
hypervascularity of the right epididymis. Mild to moderate
hypervascularity of the right testis.
2. Small simple appearing right hydrocele.

## 2015-12-09 ENCOUNTER — Other Ambulatory Visit: Payer: Self-pay | Admitting: *Deleted

## 2015-12-09 DIAGNOSIS — S069X2S Unspecified intracranial injury with loss of consciousness of 31 minutes to 59 minutes, sequela: Secondary | ICD-10-CM

## 2015-12-09 DIAGNOSIS — S24109S Unspecified injury at unspecified level of thoracic spinal cord, sequela: Secondary | ICD-10-CM

## 2015-12-09 DIAGNOSIS — S069X5S Unspecified intracranial injury with loss of consciousness greater than 24 hours with return to pre-existing conscious level, sequela: Secondary | ICD-10-CM

## 2015-12-09 DIAGNOSIS — IMO0002 Reserved for concepts with insufficient information to code with codable children: Secondary | ICD-10-CM

## 2015-12-09 MED ORDER — TRAMADOL HCL 50 MG PO TABS: 50.0000 mg | ORAL_TABLET | Freq: Every day | ORAL | 2 refills | Status: DC

## 2015-12-23 NOTE — Progress Notes (Signed)
[facility] Report   Patient Details  Name: Richard Bridges MRN: [id number] Date of Birth: [birth year] Age: 40 y.o. PCP: No primary care provider on file.  There were no vitals filed for this visit.    Past Medical History:  Diagnosis Date  . Fractures involving multiple body regions 04/14/2014   s/p multi-trauma with pelvic and spinal fractures   Past Surgical History:  Procedure Laterality Date  . SPINE SURGERY  04/15/2014   laminectomy s/p multi-trauma  . 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 Former Smoker  Smokeless Tobacco  . Never Used   Estate agent and his sister, Richard Bridges, have come in to give an update on the progress Camari has had. They both started in the Provider Referral Exercise Program (PREP) that is held here at the [facility], earlier in the year.  There was some inconsistency in them getting to weekly classes since Richard Bridges is in [place] and has been pretty busy.  Richard Bridges reports currently working with his trainer 2x/week, he is up to 3 minutes on the eliptical as well as 199 steps on his fitbit.  He says he is standing while making his coffee in the morning and seems to be in good spirits.  His sister says he is working well with his trainer and she's proud of his progress.       Vanita Ingles 12/23/2015, 1:44 PM

## 2015-12-30 ENCOUNTER — Encounter
Payer: BLUE CROSS/BLUE SHIELD | Attending: Physical Medicine & Rehabilitation | Admitting: Physical Medicine & Rehabilitation

## 2015-12-30 ENCOUNTER — Encounter: Payer: Self-pay | Admitting: Physical Medicine & Rehabilitation

## 2015-12-30 VITALS — BP 135/86 | HR 98 | Resp 16

## 2015-12-30 DIAGNOSIS — S24109S Unspecified injury at unspecified level of thoracic spinal cord, sequela: Secondary | ICD-10-CM | POA: Diagnosis not present

## 2015-12-30 DIAGNOSIS — Z87891 Personal history of nicotine dependence: Secondary | ICD-10-CM | POA: Diagnosis not present

## 2015-12-30 DIAGNOSIS — Z9889 Other specified postprocedural states: Secondary | ICD-10-CM | POA: Diagnosis not present

## 2015-12-30 DIAGNOSIS — Z9081 Acquired absence of spleen: Secondary | ICD-10-CM | POA: Insufficient documentation

## 2015-12-30 DIAGNOSIS — Z87820 Personal history of traumatic brain injury: Secondary | ICD-10-CM | POA: Diagnosis not present

## 2015-12-30 DIAGNOSIS — G253 Myoclonus: Secondary | ICD-10-CM | POA: Insufficient documentation

## 2015-12-30 DIAGNOSIS — S069X5S Unspecified intracranial injury with loss of consciousness greater than 24 hours with return to pre-existing conscious level, sequela: Secondary | ICD-10-CM | POA: Diagnosis not present

## 2015-12-30 DIAGNOSIS — X58XXXD Exposure to other specified factors, subsequent encounter: Secondary | ICD-10-CM | POA: Insufficient documentation

## 2015-12-30 DIAGNOSIS — G8220 Paraplegia, unspecified: Secondary | ICD-10-CM | POA: Insufficient documentation

## 2015-12-30 DIAGNOSIS — S24159D Other incomplete lesion at unspecified level of thoracic spinal cord, subsequent encounter: Secondary | ICD-10-CM | POA: Diagnosis present

## 2015-12-30 DIAGNOSIS — IMO0002 Reserved for concepts with insufficient information to code with codable children: Secondary | ICD-10-CM

## 2015-12-30 DIAGNOSIS — K5900 Constipation, unspecified: Secondary | ICD-10-CM | POA: Insufficient documentation

## 2015-12-30 MED ORDER — CLONAZEPAM 1 MG PO TABS: 1.0000 mg | ORAL_TABLET | Freq: Every day | ORAL | 4 refills | Status: DC

## 2015-12-30 NOTE — Patient Instructions (Signed)
PLEASE CALL ME WITH ANY PROBLEMS OR QUESTIONS ([phone])   HAPPY THANKSGIVING!!!!    

## 2015-12-30 NOTE — Progress Notes (Signed)
Subjective:    Patient ID: Richard Bridges, male    DOB: [birth year], 40 y.o.   MRN: [id number]  HPI   Richard Bridges is here in follow up of his thoracic SCI and spastic paraplegia. He states that things have been going fairly well since I last saw him.  He had some problems with constipation a couple months back related to initiating a calcium supplement. As a result he developed an upset stomach and stopped taking medications. Now he is using the klonopin in the morning only. This seems to work fairly well for him. He uses tramadol at bedtime. He remains aggressive with his HEP and stretching program. He works with a trainer twice per week. He is using his AFO's and walker for most mobility around the house. He uses his w/c if he needs to carry something.  Pain Inventory Average Pain 0 Pain Right Now 0 My pain is na  In the last 24 hours, has pain interfered with the following? General activity 0 Relation with others 0 Enjoyment of life 0 What TIME of day is your pain at its worst? daytime Sleep (in general) Fair  Pain is worse with: some activites Pain improves with: no answer Relief from Meds: 8  Mobility walk with assistance use a walker do you drive?  no use a wheelchair  Function disabled: date disabled .  Neuro/Psych tingling  Prior Studies Any changes since last visit?  no  Physicians involved in your care Any changes since last visit?  no   No family history on file. Social History   Social History  . Marital status: Single    Spouse name: N/A  . Number of children: N/A  . Years of education: N/A   Social History Main Topics  . Smoking status: Former Research scientist (life sciences)  . Smokeless tobacco: Never Used  . Alcohol use Yes  . Drug use: No  . Sexual activity: Not Asked   Other Topics Concern  . None   Social History Narrative  . None   Past Surgical History:  Procedure Laterality Date  . SPINE SURGERY  04/15/2014   laminectomy s/p multi-trauma  . SPLENECTOMY   04/14/2014   s/p multi-trauma   Past Medical History:  Diagnosis Date  . Fractures involving multiple body regions 04/14/2014   s/p multi-trauma with pelvic and spinal fractures   BP 135/86   Pulse 98   Resp 16   SpO2 98%   Opioid Risk Score:   Fall Risk Score:  `1  Depression screen PHQ 2/9  Depression screen [facility] 2/9 12/30/2015 04/10/2015 03/13/2015  Decreased Interest 0 0 0  Down, Depressed, Hopeless 0 0 0  PHQ - 2 Score 0 0 0  Altered sleeping - - 0  Tired, decreased energy - - 0  Change in appetite - - 0  Feeling bad or failure about yourself  - - 0  Trouble concentrating - - 0  Moving slowly or fidgety/restless - - 0  Suicidal thoughts - - 0  PHQ-9 Score - - 0    Review of Systems  Constitutional: Negative.   HENT: Negative.   Eyes: Negative.   Respiratory: Negative.   Cardiovascular: Negative.   Gastrointestinal: Negative.   Endocrine: Negative.   Genitourinary: Negative.   Musculoskeletal: Negative.   Allergic/Immunologic: Negative.   Neurological: Negative.   Hematological: Negative.   Psychiatric/Behavioral: Negative.   All other systems reviewed and are negative.      Objective:   Physical Exam  General: Alert  HEENT: PERRL Neck: Supple without JVD or lymphadenopathy  Heart: RRR Chest: CTA  Abdomen: Soft.  Extremities: exremities warm Skin: Clean and intact without signs of breakdown  Neuro: Pt is cognitively appropriate with normal insight, memory, and awareness. Cranial nerves 2-12 are intact. Sensory exam is diminished to LT/pain in both legs below inguinal region. . Reflexes are 3+ in the lower exts. Fine motor coordination is intact. No tremors. Motor function is grossly 5/5 in both upper limbs. LE: R--HF 3+, KE is 4+, ADF/APF 4-; LLE: 3+ hf, KE is 4+/5, 3/5 ADF/PF. Right sided clonus still appears with PROM. Pt walked for me and actually did quite well. He has recurvatum in stance phase due to decreased knee and hip control. AFO"s fitting  appropriaely  Musculoskeletal: Full ROM, No pain with AROM or PROM in the neck, trunk, or extremities. Posture appropriate in sitting Psych: Pt's affect is appropriate. Pt is cooperative       Assessment & Plan:  1. 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  2. 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 Maintain HEP as he's doing. Discussed water therapies which remain an option  2. Klonopin for myoclonus and resting tone continue at 1mg  daily  3. continue ultram which he takes at bedtime only--did not need RF today 4. Urological follow up per Dr. Alyson Ingles. Needs yearly u/s 5. Follow up with me in about 6 months. 15 minutes of face to face patient care time were spent during this visit. All questions were encouraged and answered.

## 2016-03-03 ENCOUNTER — Other Ambulatory Visit: Payer: Self-pay | Admitting: Physical Medicine & Rehabilitation

## 2016-03-03 DIAGNOSIS — S069X5S Unspecified intracranial injury with loss of consciousness greater than 24 hours with return to pre-existing conscious level, sequela: Secondary | ICD-10-CM

## 2016-03-03 DIAGNOSIS — S069X2S Unspecified intracranial injury with loss of consciousness of 31 minutes to 59 minutes, sequela: Secondary | ICD-10-CM

## 2016-03-03 DIAGNOSIS — S24109S Unspecified injury at unspecified level of thoracic spinal cord, sequela: Secondary | ICD-10-CM

## 2016-03-03 DIAGNOSIS — IMO0002 Reserved for concepts with insufficient information to code with codable children: Secondary | ICD-10-CM

## 2016-03-04 NOTE — Telephone Encounter (Signed)
Error in printing, medicine called into pharmacy for [employer]

## 2016-03-05 ENCOUNTER — Other Ambulatory Visit: Payer: Self-pay | Admitting: Physical Medicine & Rehabilitation

## 2016-03-05 DIAGNOSIS — S24109S Unspecified injury at unspecified level of thoracic spinal cord, sequela: Secondary | ICD-10-CM

## 2016-03-05 DIAGNOSIS — S069X5S Unspecified intracranial injury with loss of consciousness greater than 24 hours with return to pre-existing conscious level, sequela: Secondary | ICD-10-CM

## 2016-03-05 DIAGNOSIS — IMO0002 Reserved for concepts with insufficient information to code with codable children: Secondary | ICD-10-CM

## 2016-05-04 ENCOUNTER — Other Ambulatory Visit: Payer: Self-pay | Admitting: *Deleted

## 2016-05-04 DIAGNOSIS — S069X5S Unspecified intracranial injury with loss of consciousness greater than 24 hours with return to pre-existing conscious level, sequela: Secondary | ICD-10-CM

## 2016-05-04 DIAGNOSIS — S069X2S Unspecified intracranial injury with loss of consciousness of 31 minutes to 59 minutes, sequela: Secondary | ICD-10-CM

## 2016-05-04 DIAGNOSIS — IMO0002 Reserved for concepts with insufficient information to code with codable children: Secondary | ICD-10-CM

## 2016-05-04 DIAGNOSIS — S24109S Unspecified injury at unspecified level of thoracic spinal cord, sequela: Secondary | ICD-10-CM

## 2016-05-04 MED ORDER — TRAMADOL HCL 50 MG PO TABS: 50.0000 mg | ORAL_TABLET | Freq: Every day | ORAL | 1 refills | Status: DC

## 2016-06-29 ENCOUNTER — Encounter
Payer: BLUE CROSS/BLUE SHIELD | Attending: Physical Medicine & Rehabilitation | Admitting: Physical Medicine & Rehabilitation

## 2016-06-29 ENCOUNTER — Encounter: Payer: Self-pay | Admitting: Physical Medicine & Rehabilitation

## 2016-06-29 VITALS — BP 141/90 | HR 88 | Resp 16

## 2016-06-29 DIAGNOSIS — S24109S Unspecified injury at unspecified level of thoracic spinal cord, sequela: Secondary | ICD-10-CM | POA: Diagnosis not present

## 2016-06-29 DIAGNOSIS — S069X2S Unspecified intracranial injury with loss of consciousness of 31 minutes to 59 minutes, sequela: Secondary | ICD-10-CM | POA: Diagnosis not present

## 2016-06-29 DIAGNOSIS — Z87891 Personal history of nicotine dependence: Secondary | ICD-10-CM | POA: Diagnosis not present

## 2016-06-29 DIAGNOSIS — X58XXXS Exposure to other specified factors, sequela: Secondary | ICD-10-CM | POA: Diagnosis not present

## 2016-06-29 DIAGNOSIS — S069X9S Unspecified intracranial injury with loss of consciousness of unspecified duration, sequela: Secondary | ICD-10-CM | POA: Insufficient documentation

## 2016-06-29 DIAGNOSIS — N319 Neuromuscular dysfunction of bladder, unspecified: Secondary | ICD-10-CM | POA: Insufficient documentation

## 2016-06-29 DIAGNOSIS — G839 Paralytic syndrome, unspecified: Secondary | ICD-10-CM | POA: Diagnosis not present

## 2016-06-29 DIAGNOSIS — S069X5S Unspecified intracranial injury with loss of consciousness greater than 24 hours with return to pre-existing conscious level, sequela: Secondary | ICD-10-CM | POA: Diagnosis not present

## 2016-06-29 DIAGNOSIS — G253 Myoclonus: Secondary | ICD-10-CM | POA: Insufficient documentation

## 2016-06-29 MED ORDER — CLONAZEPAM 1 MG PO TABS: 1.0000 mg | ORAL_TABLET | Freq: Every day | ORAL | 5 refills | Status: DC

## 2016-06-29 MED ORDER — TRAMADOL HCL 50 MG PO TABS: 50.0000 mg | ORAL_TABLET | Freq: Two times a day (BID) | ORAL | 2 refills | Status: DC | PRN

## 2016-06-29 NOTE — Progress Notes (Signed)
Subjective:    Patient ID: Richard Bridges, male    DOB: 06/15/1975, 41 y.o.   MRN: [id number]  HPI  Richard Bridges is here in follow up of his SCI. He has been doing fairly well. They ulimately had to stop therapy due to cost. His clonus has been well controlled. He is only needing one klonopin per day. He is taking 1-2 tramadol for pain which seem to work well.   He tries to stay active at home. He has not had any falls. He is sleeping well. His bladder and bowels have been stable.   He is going to [place] for amonth to spend time with family. He's quite excited about this.     Pain Inventory Average Pain 0 Pain Right Now 0 My pain is constant and tingling  In the last 24 hours, has pain interfered with the following? General activity 5 Relation with others 5 Enjoyment of life 5 What TIME of day is your pain at its worst? daytime, evening  Sleep (in general) Fair  Pain is worse with:  no pain Pain improves with:  no pain Relief from Meds:  no pain  Mobility walk with assistance use a walker how many minutes can you walk? 10 ability to climb steps?  yes do you drive?  no use a wheelchair Do you have any goals in this area?  yes  Function not employed: date last employed .  Neuro/Psych tingling  Prior Studies Any changes since last visit?  no  Physicians involved in your care Any changes since last visit?  no   History reviewed. No pertinent family history. Social History   Social History  . Marital status: Single    Spouse name: N/A  . Number of children: N/A  . Years of education: N/A   Social History Main Topics  . Smoking status: Former Research scientist (life sciences)  . Smokeless tobacco: Never Used  . Alcohol use Yes  . Drug use: No  . Sexual activity: Not Asked   Other Topics Concern  . None   Social History Narrative  . None   Past Surgical History:  Procedure Laterality Date  . SPINE SURGERY  04/15/2014   laminectomy s/p multi-trauma  . SPLENECTOMY   04/14/2014   s/p multi-trauma   Past Medical History:  Diagnosis Date  . Fractures involving multiple body regions 04/14/2014   s/p multi-trauma with pelvic and spinal fractures   BP (!) 141/90 (BP Location: Right Arm, Patient Position: Sitting, Cuff Size: Normal)   Pulse 88   Resp 16   SpO2 96%   Opioid Risk Score:   Fall Risk Score:  `1  Depression screen PHQ 2/9  Depression screen [facility] 2/9 12/30/2015 04/10/2015 03/13/2015  Decreased Interest 0 0 0  Down, Depressed, Hopeless 0 0 0  PHQ - 2 Score 0 0 0  Altered sleeping - - 0  Tired, decreased energy - - 0  Change in appetite - - 0  Feeling bad or failure about yourself  - - 0  Trouble concentrating - - 0  Moving slowly or fidgety/restless - - 0  Suicidal thoughts - - 0  PHQ-9 Score - - 0    Review of Systems  Constitutional: Negative.   HENT: Negative.   Eyes: Negative.   Respiratory: Negative.   Cardiovascular: Negative.   Gastrointestinal: Negative.   Endocrine: Negative.   Genitourinary: Negative.   Musculoskeletal: Positive for gait problem.  Skin: Negative.   Allergic/Immunologic: Negative.   Neurological:  Tingling  Hematological: Negative.   Psychiatric/Behavioral: Negative.   All other systems reviewed and are negative.      Objective:   Physical Exam  General: Alert HEENT:PERRL Neck:Supple without JVD or lymphadenopathy  Heart:RRR Chest:CTA B  Abdomen:Soft.  Extremities:exremities warm Skin:Clean and intact without signs of breakdown  Neuro:Pt is cognitively appropriate with normal insight, memory, and awareness. . Cranial nerves 2-12 are intact. Sensory exam is diminished to LT/pain in both legs below inguinal region--unchanged. . . Reflexes are 3+ in the lower exts. Fine motor coordination is intact. No tremors. Motor function is grossly 5/5 in both upper limbs. LE: R--HF 3+, KE is 4+, ADF/APF 4-; LLE: 3+ hf, KE is 4+/5, 3/5 ADF/PF. Right sided clonus still appears with PROM. Pt walked  for me and actually did quite well. He has recurvatum in stance phase due to decreased knee and hip control. AFO"s fitting appropriately---velcro a little frayed  Musculoskeletal:Full ROM, No pain with AROM or PROM in the neck, trunk, or extremities. Posture appropriate in sitting Psych:Pt's affect is appropriate. Pt is cooperative and pleasant      Assessment & Plan:  1. 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  2. 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 Maintain HEP as possible. Aquatic therapy including walking would be something for him to try with family using a life vest or float.  2. Klonopin for myoclonus and resting tone continue at 1mg  daily- RF today  3. continue ultram which he takes at bedtime only--  RF today 4. Urological follow up per Dr. Alyson Ingles. U/S this summer 5. Follow up with me in about 80months. 15 minutes of face to face patient care time were spent during this visit. All questions were encouraged and answered. Greater than 50% of time during this encounter was spent counseling patient/family in regard to orthotics, HEP, medication mgt.

## 2016-06-29 NOTE — Patient Instructions (Signed)
Consider aquatic exercises, water walking this summer. Use a life vest to help keep you balanced.

## 2016-12-18 ENCOUNTER — Other Ambulatory Visit: Payer: Self-pay | Admitting: Physical Medicine & Rehabilitation

## 2016-12-18 DIAGNOSIS — S069X2S Unspecified intracranial injury with loss of consciousness of 31 minutes to 59 minutes, sequela: Secondary | ICD-10-CM

## 2016-12-18 DIAGNOSIS — S069X5S Unspecified intracranial injury with loss of consciousness greater than 24 hours with return to pre-existing conscious level, sequela: Secondary | ICD-10-CM

## 2016-12-18 DIAGNOSIS — S24109S Unspecified injury at unspecified level of thoracic spinal cord, sequela: Secondary | ICD-10-CM

## 2016-12-30 ENCOUNTER — Encounter: Payer: Self-pay | Admitting: Physical Medicine & Rehabilitation

## 2016-12-30 ENCOUNTER — Encounter
Payer: BLUE CROSS/BLUE SHIELD | Attending: Physical Medicine & Rehabilitation | Admitting: Physical Medicine & Rehabilitation

## 2016-12-30 ENCOUNTER — Other Ambulatory Visit: Payer: Self-pay

## 2016-12-30 VITALS — BP 135/94 | HR 98

## 2016-12-30 DIAGNOSIS — Z5189 Encounter for other specified aftercare: Secondary | ICD-10-CM | POA: Insufficient documentation

## 2016-12-30 DIAGNOSIS — G253 Myoclonus: Secondary | ICD-10-CM | POA: Insufficient documentation

## 2016-12-30 DIAGNOSIS — G8220 Paraplegia, unspecified: Secondary | ICD-10-CM | POA: Diagnosis not present

## 2016-12-30 DIAGNOSIS — S24109S Unspecified injury at unspecified level of thoracic spinal cord, sequela: Secondary | ICD-10-CM | POA: Diagnosis not present

## 2016-12-30 DIAGNOSIS — Z87820 Personal history of traumatic brain injury: Secondary | ICD-10-CM | POA: Insufficient documentation

## 2016-12-30 DIAGNOSIS — N319 Neuromuscular dysfunction of bladder, unspecified: Secondary | ICD-10-CM | POA: Diagnosis not present

## 2016-12-30 DIAGNOSIS — S069X5S Unspecified intracranial injury with loss of consciousness greater than 24 hours with return to pre-existing conscious level, sequela: Secondary | ICD-10-CM | POA: Diagnosis not present

## 2016-12-30 DIAGNOSIS — Z87891 Personal history of nicotine dependence: Secondary | ICD-10-CM | POA: Insufficient documentation

## 2016-12-30 MED ORDER — CLONAZEPAM 1 MG PO TABS: 1.0000 mg | ORAL_TABLET | Freq: Every day | ORAL | 5 refills | Status: DC

## 2016-12-30 NOTE — Progress Notes (Signed)
Subjective:    Patient ID: Richard Bridges, male    DOB: 1975-11-13, 41 y.o.   MRN: [id number]  HPI  Richard Bridges is here in follow-up of his spinal cord injury.  He has been generally stable for the last few months although his mild clonus is still an issue.  He is tried skipping the Klonopin dose on some days and has noticed that his myoclonus is much worse.  He typically only takes it in the morning.  He is using tramadol at night to help with pain and sleep.  He may use ibuprofen for pain during the day.  From a bowel bladder standpoint he has been fairly stable.  He is seen yearly by urology with routine ultrasound follow-up.  He is continue to work on a regular home exercise program.  They have not been as well involved as much in the water lately as his wife has been working quite a bit.  They are interested in aquatic-based therapy if available.  He is awaiting approval of his long-term disability   Pain Inventory Average Pain 2 Pain Right Now 0 My pain is .  In the last 24 hours, has pain interfered with the following? General activity 0 Relation with others 0 Enjoyment of life 0 What TIME of day is your pain at its worst? evening Sleep (in general) Good  Pain is worse with: . Pain improves with: . Relief from Meds: .  Mobility walk with assistance use a walker ability to climb steps?  yes do you drive?  no  Function not employed: date last employed 2016  Neuro/Psych trouble walking  Prior Studies Any changes since last visit?  no  Physicians involved in your care Any changes since last visit?  no   No family history on file. Social History   Socioeconomic History  . Marital status: Single    Spouse name: None  . Number of children: None  . Years of education: None  . Highest education level: None  Social Needs  . Financial resource strain: None  . Food insecurity - worry: None  . Food insecurity - inability: None  . Transportation needs -  medical: None  . Transportation needs - non-medical: None  Occupational History  . None  Tobacco Use  . Smoking status: Former Research scientist (life sciences)  . Smokeless tobacco: Never Used  Substance and Sexual Activity  . Alcohol use: Yes  . Drug use: No  . Sexual activity: None  Other Topics Concern  . None  Social History Narrative  . None   Past Surgical History:  Procedure Laterality Date  . SPINE SURGERY  04/15/2014   laminectomy s/p multi-trauma  . 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 Fractures involving multiple body regions 04/14/2014   s/p multi-trauma with pelvic and spinal fractures   There were no vitals taken for this visit.  Opioid Risk Score:   Fall Risk Score:  `1  Depression screen PHQ 2/9  Depression screen [facility] 2/9 12/30/2015 04/10/2015 03/13/2015  Decreased Interest 0 0 0  Down, Depressed, Hopeless 0 0 0  PHQ - 2 Score 0 0 0  Altered sleeping - - 0  Tired, decreased energy - - 0  Change in appetite - - 0  Feeling bad or failure about yourself  - - 0  Trouble concentrating - - 0  Moving slowly or fidgety/restless - - 0  Suicidal thoughts - - 0  PHQ-9  Score - - 0     Review of Systems  Constitutional: Negative.   HENT: Negative.   Eyes: Negative.   Respiratory: Negative.   Cardiovascular: Negative.   Gastrointestinal: Negative.   Endocrine: Negative.   Genitourinary: Negative.   Musculoskeletal: Positive for gait problem.  Skin: Negative.   Allergic/Immunologic: Negative.   Hematological: Negative.   Psychiatric/Behavioral: Negative.   All other systems reviewed and are negative.      Objective:   Physical Exam  General: Alert HEENT:PERRL Neck:Supple without JVD or lymphadenopathy  Heart:RRR Chest:CTA B Abdomen:Soft.  Extremities:exremities warm Skin:Clean and intact without signs of breakdown  Neuro:Pt is cognitively appropriate with normal insight, memory, and awareness. . Cranial nerves 2-12  are intact. Sensory exam is diminished to LT/pain in both legs below inguinal region--unchanged. . . Reflexes are 3+ in the lower exts. Fine motor coordination is intact. No tremors. Motor function is grossly 5/5 in both upper limbs. LE: R--HF 3+, KE is 4+, ADF/APF 4-; LLE: 3+ hf, KE is 4+/5, 3/5ADF/PF. Right sided clonus still appears with PROM. Pt walked for me and actually did quite well.    Musculoskeletal:Full ROM, No pain with AROM or PROM in the neck, trunk, or extremities. Posture appropriate in sitting Psych:Pt's affect is appropriate. Pt is cooperative and pleasant    Assessment & Plan:  1. 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  2. 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 Maintain HEP as possible.   -discussed the possibility of follow up aquatic/land program through [facility]. Asked his wife to follow up re: availability 2. Klonopin for myoclonus and resting tone continue at 1mg  daily- RF today  -encouraged daily use as it does seem to benefit him.  3. continueultram which he takes at bedtime only--  this was just refilled earlier this month 4. Urological follow up per Dr. Alyson Ingles. U/S yearly 5. Follow up with me in about 59months. 15 minutes of face to face patient care time were spent during this visit. All questions were encouraged and answered.

## 2016-12-30 NOTE — Patient Instructions (Signed)
PLEASE FEEL FREE TO CALL OUR OFFICE WITH ANY PROBLEMS OR QUESTIONS ([phone])      

## 2017-01-08 ENCOUNTER — Other Ambulatory Visit: Payer: Self-pay

## 2017-01-08 DIAGNOSIS — S069X5S Unspecified intracranial injury with loss of consciousness greater than 24 hours with return to pre-existing conscious level, sequela: Secondary | ICD-10-CM

## 2017-01-08 DIAGNOSIS — S24109S Unspecified injury at unspecified level of thoracic spinal cord, sequela: Secondary | ICD-10-CM

## 2017-01-20 ENCOUNTER — Other Ambulatory Visit: Payer: Self-pay

## 2017-01-20 NOTE — Telephone Encounter (Signed)
Called patients wife back, informed her that he can receive a refill of tramadol today which was ordered from last visit, and to call us in a couple weeks when they arrive at there vacation area and let us know of a pharmacy that is near them to send other refills to.

## 2017-01-20 NOTE — Telephone Encounter (Signed)
Richard Bridges, patients sister, is requesting vacation refills on clonazepam and tramadol. She stated that patient is going out of town and will not return for a month and a half. She is just making sure the patient has enough medication for his trip. She stated that the pharmacy said the refills need to be special authorized.

## 2017-03-27 ENCOUNTER — Other Ambulatory Visit: Payer: Self-pay | Admitting: Physical Medicine & Rehabilitation

## 2017-03-27 DIAGNOSIS — S069X5S Unspecified intracranial injury with loss of consciousness greater than 24 hours with return to pre-existing conscious level, sequela: Secondary | ICD-10-CM

## 2017-03-27 DIAGNOSIS — S069X2S Unspecified intracranial injury with loss of consciousness of 31 minutes to 59 minutes, sequela: Secondary | ICD-10-CM

## 2017-03-27 DIAGNOSIS — S24109S Unspecified injury at unspecified level of thoracic spinal cord, sequela: Secondary | ICD-10-CM

## 2017-03-29 ENCOUNTER — Other Ambulatory Visit: Payer: Self-pay | Admitting: Physical Medicine & Rehabilitation

## 2017-03-29 DIAGNOSIS — S069X2S Unspecified intracranial injury with loss of consciousness of 31 minutes to 59 minutes, sequela: Secondary | ICD-10-CM

## 2017-03-29 DIAGNOSIS — S24109S Unspecified injury at unspecified level of thoracic spinal cord, sequela: Secondary | ICD-10-CM

## 2017-03-29 DIAGNOSIS — S069X5S Unspecified intracranial injury with loss of consciousness greater than 24 hours with return to pre-existing conscious level, sequela: Secondary | ICD-10-CM

## 2017-04-04 ENCOUNTER — Other Ambulatory Visit: Payer: Self-pay | Admitting: Physical Medicine & Rehabilitation

## 2017-04-04 DIAGNOSIS — S24109S Unspecified injury at unspecified level of thoracic spinal cord, sequela: Secondary | ICD-10-CM

## 2017-04-04 DIAGNOSIS — S069X5S Unspecified intracranial injury with loss of consciousness greater than 24 hours with return to pre-existing conscious level, sequela: Secondary | ICD-10-CM

## 2017-04-06 ENCOUNTER — Telehealth: Payer: Self-pay

## 2017-04-06 DIAGNOSIS — S069X5S Unspecified intracranial injury with loss of consciousness greater than 24 hours with return to pre-existing conscious level, sequela: Secondary | ICD-10-CM

## 2017-04-06 DIAGNOSIS — S24109S Unspecified injury at unspecified level of thoracic spinal cord, sequela: Secondary | ICD-10-CM

## 2017-04-06 MED ORDER — CLONAZEPAM 1 MG PO TABS: 1.0000 mg | ORAL_TABLET | Freq: Every day | ORAL | 3 refills | Status: DC

## 2017-04-06 NOTE — Telephone Encounter (Signed)
Needs prescription moved from [facility] back to there local [facility], called it in, informed patients.

## 2017-04-20 ENCOUNTER — Telehealth: Payer: Self-pay | Admitting: Physical Medicine & Rehabilitation

## 2017-04-20 NOTE — Telephone Encounter (Signed)
Certainly. Just let me know what we need to do.

## 2017-04-20 NOTE — Telephone Encounter (Signed)
9:52 am 03.12.19 [facility] called and want to confirm that you will write order for Richard Bridges to continue to receive catheter supplies - please advise  Phone number to return call [phone]

## 2017-04-21 NOTE — Telephone Encounter (Signed)
Contacted [facility]. They will be faxing over the order for Dr. Naaman Plummer to sign

## 2017-05-04 ENCOUNTER — Telehealth: Payer: Self-pay | Admitting: *Deleted

## 2017-05-04 NOTE — Telephone Encounter (Signed)
[place] sent [place] a fax requesting that the prescribing physician include the following in some form of clinical documentation including clinical notes, SOAP notes, progress notes, or discharge summaries:  Urological Diagnosis  Reason for Coude (I.e. BPH, enlarged prostate, scarring, stricture, obstruction, unable to pass straight cath)  Times the patient is expected to catheterize daily  They asked that the note be signed and dated by the physician, NP, or PA prior to faxing. Next clinic visit is 06/29/2017.

## 2017-05-05 NOTE — Telephone Encounter (Signed)
Please ask pt how many times a day he caths. I will write everything on a prescription pad which can be faxed.

## 2017-05-05 NOTE — Telephone Encounter (Signed)
Per Patty, he caths 6-8 times per day.  I think they were getting around 180 ct sent to them previously

## 2017-06-29 ENCOUNTER — Encounter: Payer: Self-pay | Admitting: Physical Medicine & Rehabilitation

## 2017-06-29 ENCOUNTER — Encounter: Payer: Medicare Other | Attending: Physical Medicine & Rehabilitation | Admitting: Physical Medicine & Rehabilitation

## 2017-06-29 VITALS — BP 141/91 | HR 98 | Ht 64.0 in | Wt 172.5 lb

## 2017-06-29 DIAGNOSIS — S24109S Unspecified injury at unspecified level of thoracic spinal cord, sequela: Secondary | ICD-10-CM | POA: Diagnosis not present

## 2017-06-29 DIAGNOSIS — Z79899 Other long term (current) drug therapy: Secondary | ICD-10-CM

## 2017-06-29 DIAGNOSIS — S069X5S Unspecified intracranial injury with loss of consciousness greater than 24 hours with return to pre-existing conscious level, sequela: Secondary | ICD-10-CM | POA: Diagnosis not present

## 2017-06-29 DIAGNOSIS — Z5181 Encounter for therapeutic drug level monitoring: Secondary | ICD-10-CM

## 2017-06-29 DIAGNOSIS — Z87891 Personal history of nicotine dependence: Secondary | ICD-10-CM | POA: Insufficient documentation

## 2017-06-29 DIAGNOSIS — G253 Myoclonus: Secondary | ICD-10-CM | POA: Diagnosis not present

## 2017-06-29 DIAGNOSIS — G8220 Paraplegia, unspecified: Secondary | ICD-10-CM | POA: Insufficient documentation

## 2017-06-29 DIAGNOSIS — Z87820 Personal history of traumatic brain injury: Secondary | ICD-10-CM | POA: Diagnosis not present

## 2017-06-29 DIAGNOSIS — N319 Neuromuscular dysfunction of bladder, unspecified: Secondary | ICD-10-CM | POA: Insufficient documentation

## 2017-06-29 MED ORDER — CLONAZEPAM 1 MG PO TABS: 1.0000 mg | ORAL_TABLET | Freq: Every day | ORAL | 5 refills | Status: DC

## 2017-06-29 NOTE — Progress Notes (Addendum)
Subjective:    Patient ID: Richard Bridges, male    DOB: [birth year], 42 y.o.   MRN: [id number]  HPI   Richard Bridges is here in follow up of his thoracic spinal cord injury. He has been doing well. He started a vegetable garden which has done well. He goes to the gym twice per week. He does cardio work as well as some weight lifting.   He has clonus after he's fatigued, particularly after work outs. Paresthesias persist but seem tolerabl  Pain Inventory Average Pain 0 Pain Right Now 0 My pain is .  In the last 24 hours, has pain interfered with the following? General activity 0 Relation with others 0 Enjoyment of life 0 What TIME of day is your pain at its worst? na Sleep (in general) Good  Pain is worse with: na Pain improves with: na Relief from Meds: na  Mobility walk with assistance use a walker ability to climb steps?  yes do you drive?  no  Function not employed: date last employed .  Neuro/Psych tingling  Prior Studies Any changes since last visit?  no  Physicians involved in your care Any changes since last visit?  no   No family history on file. Social History   Socioeconomic History  . Marital status: Single    Spouse name: Not on file  . Number of children: Not on file  . Years of education: Not on file  . Highest education level: Not on file  Occupational History  . Not on file  Social Needs  . Financial resource strain: Not on file  . Food insecurity:    Worry: Not on file    Inability: Not on file  . Transportation needs:    Medical: Not on file    Non-medical: Not on file  Tobacco Use  . Smoking status: Former Research scientist (life sciences)  . Smokeless tobacco: Never Used  Substance and Sexual Activity  . Alcohol use: Yes  . Drug use: No  . Sexual activity: Not on file  Lifestyle  . Physical activity:    Days per week: Not on file    Minutes per session: Not on file  . Stress: Not on file  Relationships  . Social connections:    Talks on phone:  Not on file    Gets together: Not on file    Attends religious service: Not on file    Active member of club or organization: Not on file    Attends meetings of clubs or organizations: Not on file    Relationship status: Not on file  Other Topics Concern  . Not on file  Social History Narrative  . Not on file   Past Surgical History:  Procedure Laterality Date  . SPINE SURGERY  04/15/2014   laminectomy s/p multi-trauma  . 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 Fractures involving multiple body regions 04/14/2014   s/p multi-trauma with pelvic and spinal fractures   BP (!) 141/91   Pulse 98   Ht 5\' 4"  (1.626 m)   Wt 172 lb 8 oz (78.2 kg)   SpO2 98%   BMI 29.61 kg/m   Opioid Risk Score:   Fall Risk Score:  `1  Depression screen PHQ 2/9  Depression screen [facility] 2/9 12/30/2015 04/10/2015 03/13/2015  Decreased Interest 0 0 0  Down, Depressed, Hopeless 0 0 0  PHQ - 2 Score 0 0 0  Altered sleeping - -  0  Tired, decreased energy - - 0  Change in appetite - - 0  Feeling bad or failure about yourself  - - 0  Trouble concentrating - - 0  Moving slowly or fidgety/restless - - 0  Suicidal thoughts - - 0  PHQ-9 Score - - 0     Review of Systems  Constitutional: Negative.   HENT: Negative.   Eyes: Negative.   Respiratory: Negative.   Cardiovascular: Negative.   Gastrointestinal: Negative.   Endocrine: Negative.   Genitourinary: Negative.   Musculoskeletal: Positive for gait problem.  Skin: Negative.   Allergic/Immunologic: Negative.   Hematological: Negative.   Psychiatric/Behavioral: Negative.   All other systems reviewed and are negative.      Objective:   Physical Exam  General: No acute distress HEENT: EOMI, oral membranes moist Cards: reg rate  Chest: normal effort Abdomen: Soft, NT, ND Skin: dry, intact Extremities: no edema Skin:Clean and intact without signs of breakdown  Neuro:Pt is cognitively appropriate  with normal insight, memory, and awareness.. Cranial nerves 2-12 are intact. Sensory exam remains diminished to LT/pain in both legs below inguinal region.. . Reflexes are 3+ in the lower exts. Fine motor coordination is intact. No tremors. Motor function is grossly 5/5 in both upper limbs. LE: R--HF 3+, KE is 4+, ADF/APF 4; LLE: 3+ hf, KE is 4+/5, 3/5ADF/PF. Right sided clonus still appears with PROM--more prominent today. Walked without a brace on left leg and did well, toes occasionally caught during swing. No other mechanical abnl.      Musculoskeletal:Full ROM, No pain with AROM or PROM in the neck, trunk, or extremities. Posture appropriate in sitting Psych:Pt's affect is appropriate. Pt is cooperative and pleasant    Assessment & Plan:  1. 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  2. 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 Neurogenic Bladder with urinary retention   Plan:   1.Maintain HEP as possible.             -continue as he's doing, emphasizng strengthe, gait and wb activities 2. Klonopin for myoclonus and resting tone continue at 1mg  daily- RF today x 5            -encouraged daily use as it does seem to benefit him.  3. Continueultram which he takes at bedtime only-- no RF today.  4. Urological follow up per Dr. Alyson Ingles.U/S yearly  -needs caths 6-8 x per day  -requires coude cath due to strictures/tight urethra 5. Follow up with me in about 39months. 15 minutes of face to face patient care time were spent during this visit. All questions were encouraged and answered.

## 2017-06-29 NOTE — Patient Instructions (Addendum)
Contact [facility] about new velcro for your right AFO  Use a left AFO if you have a long distance to walk.   Remember to exaggerate your toe off when you walk without the left AFO

## 2017-07-02 LAB — DRUG TOX MONITOR 1 W/CONF, ORAL FLD
Alprazolam: NEGATIVE ng/mL (ref <0.50)
Amphetamines: NEGATIVE ng/mL (ref <10)
Barbiturates: NEGATIVE ng/mL (ref <10)
Benzodiazepines: POSITIVE ng/mL — AB (ref <0.50)
Buprenorphine: NEGATIVE ng/mL (ref <0.10)
Chlordiazepoxide: NEGATIVE ng/mL (ref <0.50)
Clonazepam: 1.2 ng/mL — ABNORMAL HIGH (ref <0.50)
Cocaine: NEGATIVE ng/mL (ref <5.0)
Diazepam: NEGATIVE ng/mL (ref <0.50)
FENTANYL: NEGATIVE ng/mL (ref <0.10)
FLUNITRAZEPAM: NEGATIVE ng/mL (ref <0.50)
FLURAZEPAM: NEGATIVE ng/mL (ref <0.50)
HEROIN METABOLITE: NEGATIVE ng/mL (ref <1.0)
Lorazepam: NEGATIVE ng/mL (ref <0.50)
MARIJUANA: NEGATIVE ng/mL (ref <2.5)
MDMA: NEGATIVE ng/mL (ref <10)
MIDAZOLAM: NEGATIVE ng/mL (ref <0.50)
Meprobamate: NEGATIVE ng/mL (ref <2.5)
Methadone: NEGATIVE ng/mL (ref <5.0)
Nicotine Metabolite: NEGATIVE ng/mL (ref <5.0)
Nordiazepam: NEGATIVE ng/mL (ref <0.50)
OPIATES: NEGATIVE ng/mL (ref <2.5)
Oxazepam: NEGATIVE ng/mL (ref <0.50)
PHENCYCLIDINE: NEGATIVE ng/mL (ref <10)
TRAMADOL: POSITIVE ng/mL — AB (ref <5.0)
TRIAZOLAM: NEGATIVE ng/mL (ref <0.50)
Tapentadol: NEGATIVE ng/mL (ref <5.0)
Temazepam: NEGATIVE ng/mL (ref <0.50)
Tramadol: 445 ng/mL — ABNORMAL HIGH (ref <5.0)
Zolpidem: NEGATIVE ng/mL (ref <5.0)

## 2017-07-02 LAB — DRUG TOX ALC METAB W/CON, ORAL FLD: ALCOHOL METABOLITE: NEGATIVE ng/mL (ref <25)

## 2017-07-08 ENCOUNTER — Telehealth: Payer: Self-pay | Admitting: *Deleted

## 2017-07-08 NOTE — Telephone Encounter (Signed)
Oral swab drug screen was consistent for prescribed medications.  ?

## 2017-09-07 ENCOUNTER — Telehealth: Payer: Self-pay | Admitting: *Deleted

## 2017-09-07 NOTE — Telephone Encounter (Signed)
Received a call from [facility] about Richard Bridges catheter supplies--the clinical notes need addendum and they have faxed to [place] to have addended and returned to them.(In your folder)  See the telephone message 05/04/17 from [place].

## 2017-09-08 NOTE — Telephone Encounter (Signed)
I contacted patients wife. Patient has established care with a urologist.  Patient's wife says Richard Bridges made a mistake. She has spoken to them and it has been straightened out.  Urologist will be the ordering prescriber for catheters and supplies.

## 2017-09-14 DIAGNOSIS — N1330 Unspecified hydronephrosis: Secondary | ICD-10-CM | POA: Diagnosis not present

## 2017-09-14 DIAGNOSIS — N312 Flaccid neuropathic bladder, not elsewhere classified: Secondary | ICD-10-CM | POA: Diagnosis not present

## 2017-09-21 ENCOUNTER — Telehealth: Payer: Self-pay | Admitting: *Deleted

## 2017-09-21 NOTE — Telephone Encounter (Signed)
He may have abx, but dentist should be prescribing prior to HIS procedure like any other dentist would do in this situation

## 2017-09-21 NOTE — Telephone Encounter (Signed)
Luciano Cutter Mr Vosler sister called statin g he was going to have a dental procedure and the dentist will not do it without prophylactic antibiotic and she is requesting Dr Naaman Plummer to order. I explained that we do not provide this and they need to call the PCP.  He does not currently have a PCP and she insists Dr Naaman Plummer took over from [facility]. Please advise.

## 2017-09-23 NOTE — Telephone Encounter (Signed)
Left voicemail explainig that Dr. Naaman Plummer is okay with antibiotic but he is not the one to prescribe.  I explained that Dr. Naaman Plummer expects the dentist performing the procedure to be responsible.

## 2017-10-03 ENCOUNTER — Other Ambulatory Visit: Payer: Self-pay | Admitting: Physical Medicine & Rehabilitation

## 2017-10-03 DIAGNOSIS — S24109S Unspecified injury at unspecified level of thoracic spinal cord, sequela: Secondary | ICD-10-CM

## 2017-10-03 DIAGNOSIS — S069X5S Unspecified intracranial injury with loss of consciousness greater than 24 hours with return to pre-existing conscious level, sequela: Secondary | ICD-10-CM

## 2017-10-03 DIAGNOSIS — S069X2S Unspecified intracranial injury with loss of consciousness of 31 minutes to 59 minutes, sequela: Secondary | ICD-10-CM

## 2017-10-27 DIAGNOSIS — S24109D Unspecified injury at unspecified level of thoracic spinal cord, subsequent encounter: Secondary | ICD-10-CM | POA: Diagnosis not present

## 2017-10-27 DIAGNOSIS — G839 Paralytic syndrome, unspecified: Secondary | ICD-10-CM | POA: Diagnosis not present

## 2017-10-27 DIAGNOSIS — N312 Flaccid neuropathic bladder, not elsewhere classified: Secondary | ICD-10-CM | POA: Diagnosis not present

## 2017-10-27 DIAGNOSIS — S069X9D Unspecified intracranial injury with loss of consciousness of unspecified duration, subsequent encounter: Secondary | ICD-10-CM | POA: Diagnosis not present

## 2017-11-03 ENCOUNTER — Telehealth: Payer: Self-pay

## 2017-11-03 NOTE — Telephone Encounter (Signed)
REALLY???? He has been taking these for years.   So I guess my answer would be yes continue

## 2017-11-03 NOTE — Telephone Encounter (Signed)
Pharmacy called stating there is a drug interaction with Clonzapam and Tramadol. Pharmacist is asking if both can be filled together?

## 2017-11-03 NOTE — Telephone Encounter (Signed)
Kristopher Oppenheim pharmacist notified.

## 2018-01-03 ENCOUNTER — Ambulatory Visit: Payer: Medicare Other | Admitting: Physical Medicine & Rehabilitation

## 2018-01-17 DIAGNOSIS — Z0000 Encounter for general adult medical examination without abnormal findings: Secondary | ICD-10-CM | POA: Diagnosis not present

## 2018-01-17 DIAGNOSIS — Z23 Encounter for immunization: Secondary | ICD-10-CM | POA: Diagnosis not present

## 2018-01-17 DIAGNOSIS — Z136 Encounter for screening for cardiovascular disorders: Secondary | ICD-10-CM | POA: Diagnosis not present

## 2018-01-17 DIAGNOSIS — Z13220 Encounter for screening for lipoid disorders: Secondary | ICD-10-CM | POA: Diagnosis not present

## 2018-01-27 ENCOUNTER — Other Ambulatory Visit: Payer: Self-pay | Admitting: Physical Medicine & Rehabilitation

## 2018-01-27 DIAGNOSIS — S24109S Unspecified injury at unspecified level of thoracic spinal cord, sequela: Secondary | ICD-10-CM

## 2018-01-27 DIAGNOSIS — S069X5S Unspecified intracranial injury with loss of consciousness greater than 24 hours with return to pre-existing conscious level, sequela: Secondary | ICD-10-CM

## 2018-02-16 ENCOUNTER — Encounter: Payer: Self-pay | Admitting: Physical Medicine & Rehabilitation

## 2018-02-16 ENCOUNTER — Encounter: Payer: Medicare Other | Attending: Physical Medicine & Rehabilitation | Admitting: Physical Medicine & Rehabilitation

## 2018-02-16 VITALS — BP 126/89 | HR 100 | Ht 67.0 in | Wt 175.0 lb

## 2018-02-16 DIAGNOSIS — Z79899 Other long term (current) drug therapy: Secondary | ICD-10-CM | POA: Diagnosis not present

## 2018-02-16 DIAGNOSIS — S24109S Unspecified injury at unspecified level of thoracic spinal cord, sequela: Secondary | ICD-10-CM | POA: Insufficient documentation

## 2018-02-16 DIAGNOSIS — N319 Neuromuscular dysfunction of bladder, unspecified: Secondary | ICD-10-CM

## 2018-02-16 DIAGNOSIS — Z5181 Encounter for therapeutic drug level monitoring: Secondary | ICD-10-CM | POA: Insufficient documentation

## 2018-02-16 NOTE — Progress Notes (Signed)
Subjective:    Patient ID: Richard Bridges, male    DOB: 1975/05/01, 43 y.o.   MRN: [id number]  HPI   [name] is here in follow up of his TBI/SCI. He has begun urinating now and only I/O cathed 4 x since November!!   He remains on tramadol at night time only which helps with pain and spasms. He uses klonopin during the day. He clonus with most movements and with ROM.  He is exercising daily. He has worked on his Aeronautical engineer. He utilizes a rolling walker for balance and is using an AFO still on the RLE.   Pain levels are minimal at this point. Mood has been good.     Pain Inventory Average Pain 0 Pain Right Now 0 My pain is na  In the last 24 hours, has pain interfered with the following? General activity 0 Relation with others 0 Enjoyment of life 0 What TIME of day is your pain at its worst? na Sleep (in general) Good  Pain is worse with: na Pain improves with: na Relief from Meds: na  Mobility walk with assistance use a walker ability to climb steps?  yes do you drive?  no  Function Do you have any goals in this area?  yes  Neuro/Psych tingling  Prior Studies Any changes since last visit?  no  Physicians involved in your care Any changes since last visit?  no   No family history on file. Social History   Socioeconomic History  . Marital status: Single    Spouse name: Not on file  . Number of children: Not on file  . Years of education: Not on file  . Highest education level: Not on file  Occupational History  . Not on file  Social Needs  . Financial resource strain: Not on file  . Food insecurity:    Worry: Not on file    Inability: Not on file  . Transportation needs:    Medical: Not on file    Non-medical: Not on file  Tobacco Use  . Smoking status: Former Research scientist (life sciences)  . Smokeless tobacco: Never Used  Substance and Sexual Activity  . Alcohol use: Yes  . Drug use: No  . Sexual activity: Not on file  Lifestyle  . Physical activity:   Days per week: Not on file    Minutes per session: Not on file  . Stress: Not on file  Relationships  . Social connections:    Talks on phone: Not on file    Gets together: Not on file    Attends religious service: Not on file    Active member of club or organization: Not on file    Attends meetings of clubs or organizations: Not on file    Relationship status: Not on file  Other Topics Concern  . Not on file  Social History Narrative  . Not on file   Past Surgical History:  Procedure Laterality Date  . SPINE SURGERY  04/15/2014   laminectomy s/p multi-trauma  . 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 Fractures involving multiple body regions 04/14/2014   s/p multi-trauma with pelvic and spinal fractures   BP 126/89   Pulse 100   Ht 5\' 7"  (1.702 m)   Wt 175 lb (79.4 kg)   SpO2 96%   BMI 27.41 kg/m   Opioid Risk Score:   Fall Risk Score:  `1  Depression screen [facility] 2/9  Depression screen [facility] 2/9 12/30/2015 04/10/2015 03/13/2015  Decreased Interest 0 0 0  Down, Depressed, Hopeless 0 0 0  PHQ - 2 Score 0 0 0  Altered sleeping - - 0  Tired, decreased energy - - 0  Change in appetite - - 0  Feeling bad or failure about yourself  - - 0  Trouble concentrating - - 0  Moving slowly or fidgety/restless - - 0  Suicidal thoughts - - 0  PHQ-9 Score - - 0     Review of Systems  Constitutional: Negative.   HENT: Negative.   Eyes: Negative.   Respiratory: Negative.   Cardiovascular: Negative.   Gastrointestinal: Negative.   Endocrine: Negative.   Genitourinary: Negative.   Musculoskeletal: Positive for gait problem.  Skin: Negative.   Allergic/Immunologic: Negative.   Neurological: Positive for numbness.  Hematological: Negative.   Psychiatric/Behavioral: Negative.   All other systems reviewed and are negative.      Objective:   Physical Exam  General: No acute distress HEENT: EOMI, oral membranes moist Cards: reg rate   Chest: normal effort Abdomen: Soft, NT, ND Skin: dry, intact Extremities: no edema  Neuro:Pt is cognitively appropriate with normal insight, memory, and awareness.. Cranial nerves 2-12 are intact. Sensory exam remains diminished to LT/pain/proprioception in both legs below inguinal region.. . Reflexes are 3+ in the lower exts. Fine motor coordination is intact. No tremors. Motor function is grossly 5/5 in both upper limbs. LE: R--HF 3+, KE is 4+, ADF/APF 4; LLE: 3+ hf, KE is 4+/5, 3/5ADF/PF. Continued clonus. Gait mechanics improved with better swing and clearance of toes. Less circumduction   Musculoskeletal:Full ROM, No pain with AROM or PROM in the neck, trunk, or extremities. Posture appropriate in sitting Psych:cooperative and pleasant   Assessment & Plan:  1. 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  2. 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 Neurogenic Bladder with urinary retention   Plan:  1.Maintain HEP as possible. -continue as he's doing, emphasizng strengthe, gait and wb activities  -gait mechanics improving 2. Klonopin for myoclonus and resting tone continue at 1mg  daily- RF last month -encouraged daily use as it does seem to benefit him. 3. Continueultram which he takes at bedtime only--no RF required today  4. Urological follow up per Dr. Alyson Ingles.           -voiding now with occasional I/O cath! 5. Follow up with me in about44months. 43minutes of face to face patient care time were spent during this visit. All questions were encouraged and answered.

## 2018-02-16 NOTE — Addendum Note (Signed)
Addended by: Marland Mcalpine B on: 02/16/2018 02:44 PM   Modules accepted: Orders

## 2018-02-16 NOTE — Patient Instructions (Signed)
PLEASE FEEL FREE TO CALL OUR OFFICE WITH ANY PROBLEMS OR QUESTIONS ([phone])      

## 2018-02-21 LAB — DRUG TOX MONITOR 1 W/CONF, ORAL FLD
ALPRAZOLAM: NEGATIVE ng/mL (ref <0.50)
Amphetamines: NEGATIVE ng/mL (ref <10)
BENZODIAZEPINES: POSITIVE ng/mL — AB (ref <0.50)
BUPRENORPHINE: NEGATIVE ng/mL (ref <0.10)
Barbiturates: NEGATIVE ng/mL (ref <10)
CHLORDIAZEPOXIDE: NEGATIVE ng/mL (ref <0.50)
CLONAZEPAM: 0.83 ng/mL — AB (ref <0.50)
COCAINE: NEGATIVE ng/mL (ref <5.0)
COTININE: 7 ng/mL — AB (ref <5.0)
Diazepam: NEGATIVE ng/mL (ref <0.50)
Fentanyl: NEGATIVE ng/mL (ref <0.10)
Flunitrazepam: NEGATIVE ng/mL (ref <0.50)
Flurazepam: NEGATIVE ng/mL (ref <0.50)
Heroin Metabolite: NEGATIVE ng/mL (ref <1.0)
Lorazepam: NEGATIVE ng/mL (ref <0.50)
MARIJUANA: NEGATIVE ng/mL (ref <2.5)
MDMA: NEGATIVE ng/mL (ref <10)
MEPROBAMATE: NEGATIVE ng/mL (ref <2.5)
METHADONE: NEGATIVE ng/mL (ref <5.0)
Midazolam: NEGATIVE ng/mL (ref <0.50)
NICOTINE METABOLITE: POSITIVE ng/mL — AB (ref <5.0)
NORDIAZEPAM: NEGATIVE ng/mL (ref <0.50)
Opiates: NEGATIVE ng/mL (ref <2.5)
Oxazepam: NEGATIVE ng/mL (ref <0.50)
PHENCYCLIDINE: NEGATIVE ng/mL (ref <10)
TEMAZEPAM: NEGATIVE ng/mL (ref <0.50)
TRIAZOLAM: NEGATIVE ng/mL (ref <0.50)
Tapentadol: NEGATIVE ng/mL (ref <5.0)
Tramadol: >500 ng/mL — ABNORMAL HIGH (ref <5.0)
Tramadol: POSITIVE ng/mL — AB (ref <5.0)
Zolpidem: NEGATIVE ng/mL (ref <5.0)

## 2018-02-21 LAB — DRUG TOX ALC METAB W/CON, ORAL FLD: Alcohol Metabolite: NEGATIVE ng/mL (ref <25)

## 2018-02-22 ENCOUNTER — Telehealth: Payer: Self-pay | Admitting: *Deleted

## 2018-02-22 NOTE — Telephone Encounter (Signed)
Oral swab drug screen was consistent for prescribed medications.  ?

## 2018-03-12 ENCOUNTER — Other Ambulatory Visit: Payer: Self-pay | Admitting: Physical Medicine & Rehabilitation

## 2018-03-12 DIAGNOSIS — S069X2S Unspecified intracranial injury with loss of consciousness of 31 minutes to 59 minutes, sequela: Secondary | ICD-10-CM

## 2018-03-12 DIAGNOSIS — S24109S Unspecified injury at unspecified level of thoracic spinal cord, sequela: Secondary | ICD-10-CM

## 2018-03-12 DIAGNOSIS — S069X5S Unspecified intracranial injury with loss of consciousness greater than 24 hours with return to pre-existing conscious level, sequela: Secondary | ICD-10-CM

## 2018-04-11 ENCOUNTER — Telehealth: Payer: Self-pay

## 2018-04-11 DIAGNOSIS — R509 Fever, unspecified: Secondary | ICD-10-CM | POA: Diagnosis not present

## 2018-04-11 DIAGNOSIS — N390 Urinary tract infection, site not specified: Secondary | ICD-10-CM | POA: Diagnosis not present

## 2018-04-11 NOTE — Telephone Encounter (Signed)
Richard Bridges called stating pt had a fever over 100 yesterday and shaking more than usual. He is not having and cold symptoms nor UTI symptoms. Taking him to Urgent Care. Any suggestions?

## 2018-04-12 NOTE — Telephone Encounter (Signed)
He's probably already been to urgent care but.......  Certainly would check urine.   Also, if he's having any swelling in the legs, lower ext dopplers should be checked.

## 2018-04-14 NOTE — Telephone Encounter (Signed)
Did take him to the urgent and he has a UTI and bladder infection. He is on an antibiotic and legs are doing better.

## 2018-06-29 ENCOUNTER — Other Ambulatory Visit: Payer: Self-pay | Admitting: Physical Medicine & Rehabilitation

## 2018-06-29 DIAGNOSIS — S069X5S Unspecified intracranial injury with loss of consciousness greater than 24 hours with return to pre-existing conscious level, sequela: Secondary | ICD-10-CM

## 2018-06-29 DIAGNOSIS — S24109S Unspecified injury at unspecified level of thoracic spinal cord, sequela: Secondary | ICD-10-CM

## 2018-06-29 DIAGNOSIS — S069X2S Unspecified intracranial injury with loss of consciousness of 31 minutes to 59 minutes, sequela: Secondary | ICD-10-CM

## 2018-09-10 DIAGNOSIS — R29898 Other symptoms and signs involving the musculoskeletal system: Secondary | ICD-10-CM | POA: Diagnosis not present

## 2018-09-10 DIAGNOSIS — L03116 Cellulitis of left lower limb: Secondary | ICD-10-CM | POA: Diagnosis not present

## 2018-09-10 DIAGNOSIS — F329 Major depressive disorder, single episode, unspecified: Secondary | ICD-10-CM | POA: Diagnosis not present

## 2018-09-10 DIAGNOSIS — L03115 Cellulitis of right lower limb: Secondary | ICD-10-CM | POA: Diagnosis not present

## 2018-09-10 DIAGNOSIS — R252 Cramp and spasm: Secondary | ICD-10-CM | POA: Diagnosis not present

## 2018-09-10 DIAGNOSIS — G589 Mononeuropathy, unspecified: Secondary | ICD-10-CM | POA: Diagnosis not present

## 2018-09-10 DIAGNOSIS — Z9081 Acquired absence of spleen: Secondary | ICD-10-CM | POA: Diagnosis not present

## 2018-09-10 DIAGNOSIS — L551 Sunburn of second degree: Secondary | ICD-10-CM | POA: Diagnosis not present

## 2018-09-12 ENCOUNTER — Ambulatory Visit: Payer: Medicare Other | Admitting: Physical Medicine & Rehabilitation

## 2018-09-14 ENCOUNTER — Encounter: Payer: Medicare Other | Admitting: Physical Medicine & Rehabilitation

## 2018-09-14 DIAGNOSIS — L03115 Cellulitis of right lower limb: Secondary | ICD-10-CM | POA: Diagnosis not present

## 2018-09-14 DIAGNOSIS — L03116 Cellulitis of left lower limb: Secondary | ICD-10-CM | POA: Diagnosis not present

## 2018-09-14 DIAGNOSIS — Q8901 Asplenia (congenital): Secondary | ICD-10-CM | POA: Diagnosis not present

## 2018-09-14 DIAGNOSIS — Z888 Allergy status to other drugs, medicaments and biological substances status: Secondary | ICD-10-CM | POA: Diagnosis not present

## 2018-09-14 DIAGNOSIS — G629 Polyneuropathy, unspecified: Secondary | ICD-10-CM | POA: Diagnosis not present

## 2018-09-14 DIAGNOSIS — L551 Sunburn of second degree: Secondary | ICD-10-CM | POA: Diagnosis not present

## 2018-09-14 DIAGNOSIS — Z87891 Personal history of nicotine dependence: Secondary | ICD-10-CM | POA: Diagnosis not present

## 2018-09-19 DIAGNOSIS — L03116 Cellulitis of left lower limb: Secondary | ICD-10-CM | POA: Diagnosis not present

## 2018-09-19 DIAGNOSIS — L551 Sunburn of second degree: Secondary | ICD-10-CM | POA: Diagnosis not present

## 2018-09-19 DIAGNOSIS — Z888 Allergy status to other drugs, medicaments and biological substances status: Secondary | ICD-10-CM | POA: Diagnosis not present

## 2018-09-19 DIAGNOSIS — L03115 Cellulitis of right lower limb: Secondary | ICD-10-CM | POA: Diagnosis not present

## 2018-09-19 DIAGNOSIS — Q8901 Asplenia (congenital): Secondary | ICD-10-CM | POA: Diagnosis not present

## 2018-09-19 DIAGNOSIS — G629 Polyneuropathy, unspecified: Secondary | ICD-10-CM | POA: Diagnosis not present

## 2018-09-29 ENCOUNTER — Other Ambulatory Visit: Payer: Self-pay | Admitting: Physical Medicine & Rehabilitation

## 2018-09-29 DIAGNOSIS — S069X5S Unspecified intracranial injury with loss of consciousness greater than 24 hours with return to pre-existing conscious level, sequela: Secondary | ICD-10-CM

## 2018-09-29 DIAGNOSIS — S069X2S Unspecified intracranial injury with loss of consciousness of 31 minutes to 59 minutes, sequela: Secondary | ICD-10-CM

## 2018-09-29 DIAGNOSIS — S24109S Unspecified injury at unspecified level of thoracic spinal cord, sequela: Secondary | ICD-10-CM

## 2018-10-30 ENCOUNTER — Other Ambulatory Visit: Payer: Self-pay | Admitting: Physical Medicine & Rehabilitation

## 2018-10-30 DIAGNOSIS — S24109S Unspecified injury at unspecified level of thoracic spinal cord, sequela: Secondary | ICD-10-CM

## 2018-10-30 DIAGNOSIS — S069X5S Unspecified intracranial injury with loss of consciousness greater than 24 hours with return to pre-existing conscious level, sequela: Secondary | ICD-10-CM

## 2018-11-02 ENCOUNTER — Encounter: Payer: Medicare Other | Attending: Physical Medicine & Rehabilitation | Admitting: Physical Medicine & Rehabilitation

## 2018-11-02 ENCOUNTER — Other Ambulatory Visit: Payer: Self-pay

## 2018-11-02 ENCOUNTER — Encounter: Payer: Self-pay | Admitting: Physical Medicine & Rehabilitation

## 2018-11-02 VITALS — BP 141/95 | HR 105 | Temp 97.7°F | Ht 64.0 in | Wt 176.0 lb

## 2018-11-02 DIAGNOSIS — G839 Paralytic syndrome, unspecified: Secondary | ICD-10-CM | POA: Insufficient documentation

## 2018-11-02 DIAGNOSIS — S24109S Unspecified injury at unspecified level of thoracic spinal cord, sequela: Secondary | ICD-10-CM | POA: Diagnosis not present

## 2018-11-02 DIAGNOSIS — S069X5S Unspecified intracranial injury with loss of consciousness greater than 24 hours with return to pre-existing conscious level, sequela: Secondary | ICD-10-CM | POA: Insufficient documentation

## 2018-11-02 MED ORDER — CLONAZEPAM 1 MG PO TABS: 1.0000 mg | ORAL_TABLET | Freq: Every day | ORAL | 2 refills | Status: DC

## 2018-11-02 NOTE — Progress Notes (Signed)
Subjective:    Patient ID: Richard Bridges, male    DOB: 08/01/1975, 43 y.o.   MRN: [id number]  HPI   Mr Rickert is here in follow up of his TBI/SCI. He suffered some sunburn to his feet at the beach last month and his exercising suffered as a result.  He is just now getting back to walking around the house again and trying to resume his normal exercise program.  He is walking in the house without his brace now and asked me that is okay.  He tells me he is trying to focus on his mechanics.  He uses the AFO when he walks longer distances outside the home or on uneven surfaces.  Bowel and bladder have remained stable.  Mood is been reasonable.  Sleep has been consistent for the most part.  His spasms have been manageable with the Klonopin.  He uses tramadol for more severe pain  Pain Inventory Average Pain 0 Pain Right Now 0 My pain is na  In the last 24 hours, has pain interfered with the following? General activity 0 Relation with others 0 Enjoyment of life 0 What TIME of day is your pain at its worst? na Sleep (in general) Good  Pain is worse with: na Pain improves with: na Relief from Meds: na  Mobility walk with assistance use a walker ability to climb steps?  yes do you drive?  no  Function disabled: date disabled .  Neuro/Psych tingling  Prior Studies Any changes since last visit?  no  Physicians involved in your care Any changes since last visit?  no   No family history on file. Social History   Socioeconomic History  . Marital status: Single    Spouse name: Not on file  . Number of children: Not on file  . Years of education: Not on file  . Highest education level: Not on file  Occupational History  . Not on file  Social Needs  . Financial resource strain: Not on file  . Food insecurity    Worry: Not on file    Inability: Not on file  . Transportation needs    Medical: Not on file    Non-medical: Not on file  Tobacco Use  . Smoking status:  Former Research scientist (life sciences)  . Smokeless tobacco: Never Used  Substance and Sexual Activity  . Alcohol use: Yes  . Drug use: No  . Sexual activity: Not on file  Lifestyle  . Physical activity    Days per week: Not on file    Minutes per session: Not on file  . Stress: Not on file  Relationships  . Social Herbalist on phone: Not on file    Gets together: Not on file    Attends religious service: Not on file    Active member of club or organization: Not on file    Attends meetings of clubs or organizations: Not on file    Relationship status: Not on file  Other Topics Concern  . Not on file  Social History Narrative  . Not on file   Past Surgical History:  Procedure Laterality Date  . SPINE SURGERY  04/15/2014   laminectomy s/p multi-trauma  . 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 Fractures involving multiple body regions 04/14/2014   s/p multi-trauma with pelvic and spinal fractures   BP (!) 141/95   Pulse (!) 105   Temp 97.7  F (36.5 C)   Ht 5\' 4"  (1.626 m)   Wt 176 lb (79.8 kg)   SpO2 98%   BMI 30.21 kg/m   Opioid Risk Score:   Fall Risk Score:  `1  Depression screen [facility] 2/9  Depression screen [facility] 2/9 12/30/2015 04/10/2015 03/13/2015  Decreased Interest 0 0 0  Down, Depressed, Hopeless 0 0 0  PHQ - 2 Score 0 0 0  Altered sleeping - - 0  Tired, decreased energy - - 0  Change in appetite - - 0  Feeling bad or failure about yourself  - - 0  Trouble concentrating - - 0  Moving slowly or fidgety/restless - - 0  Suicidal thoughts - - 0  PHQ-9 Score - - 0     Review of Systems  Constitutional: Negative.   HENT: Negative.   Eyes: Negative.   Respiratory: Negative.   Cardiovascular: Negative.   Gastrointestinal: Negative.   Endocrine: Negative.   Genitourinary: Negative.   Musculoskeletal: Positive for gait problem.  Skin: Negative.   Allergic/Immunologic: Negative.   Neurological: Positive for numbness.   Hematological: Negative.   Psychiatric/Behavioral: Negative.   All other systems reviewed and are negative.      Objective:   Physical Exam General: No acute distress HEENT: EOMI, oral membranes moist Cards: reg rate  Chest: normal effort Abdomen: Soft, NT, ND Skin: dry, intact Extremities: no edema  Neuro:Pt is cognitively appropriate with normal insight, memory, and awareness.. Cranial nerves 2-12 are intact. Sensory exam remainsdiminished to LT/pain/proprioception in both legs below inguinal region.. . Reflexes are 3+ in the lower exts. Fine motor coordination is intact. No tremors. Motor function is grossly 5/5 in both upper limbs. LE: R--HF3+, KE is4+, ADF/APF 4; LLE: 3+ hf, KE is 4+/5, 3- to 3/5ADF/PF. ambulates with some steppage qualities but making better effort to bend knee and swing leg through.. No clonus seen today..    Musculoskeletal:Full ROM, No pain with AROM or PROM in the neck, trunk, or extremities. Posture appropriate in sitting Psych:cooperative and pleasant   Assessment & Plan:  1. 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  2. 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 Neurogenic Bladderwith urinary retention   Plan:  1.Maintain HEP as possible. -continue HEP paying close attention to gait mechanics.  I think he is making nice progress here.  -can go without AFO for in house/therapeutic gait          -consider aquatic therapy in 2021? 2. Klonopin for myoclonus and resting tone continue at 1mg  daily- RF today for next month  3.Continueultram which he takes at bedtime only--no RF required today 4. Urological follow up per Dr. Alyson Ingles.  -voiding now with occasional I/O cath! 5. Follow up with me in about42months. 22minutes of face to face patient care time were spent during this visit. All questions were encouraged and  answered.

## 2018-11-02 NOTE — Patient Instructions (Signed)
PLEASE FEEL FREE TO CALL OUR OFFICE WITH ANY PROBLEMS OR QUESTIONS ([phone])      

## 2019-01-26 ENCOUNTER — Other Ambulatory Visit: Payer: Self-pay | Admitting: Physical Medicine & Rehabilitation

## 2019-01-26 DIAGNOSIS — S069X5S Unspecified intracranial injury with loss of consciousness greater than 24 hours with return to pre-existing conscious level, sequela: Secondary | ICD-10-CM

## 2019-01-26 DIAGNOSIS — S24109S Unspecified injury at unspecified level of thoracic spinal cord, sequela: Secondary | ICD-10-CM

## 2019-02-23 DIAGNOSIS — R69 Illness, unspecified: Secondary | ICD-10-CM | POA: Diagnosis not present

## 2019-04-03 ENCOUNTER — Other Ambulatory Visit: Payer: Self-pay | Admitting: Physical Medicine & Rehabilitation

## 2019-04-03 DIAGNOSIS — S069X5S Unspecified intracranial injury with loss of consciousness greater than 24 hours with return to pre-existing conscious level, sequela: Secondary | ICD-10-CM

## 2019-04-03 DIAGNOSIS — S069X2S Unspecified intracranial injury with loss of consciousness of 31 minutes to 59 minutes, sequela: Secondary | ICD-10-CM

## 2019-04-03 DIAGNOSIS — S24109S Unspecified injury at unspecified level of thoracic spinal cord, sequela: Secondary | ICD-10-CM

## 2019-04-28 ENCOUNTER — Ambulatory Visit: Payer: Medicare HMO | Attending: Internal Medicine

## 2019-04-28 ENCOUNTER — Other Ambulatory Visit: Payer: Self-pay | Admitting: Physical Medicine & Rehabilitation

## 2019-04-28 ENCOUNTER — Other Ambulatory Visit: Payer: Self-pay

## 2019-04-28 DIAGNOSIS — S24109S Unspecified injury at unspecified level of thoracic spinal cord, sequela: Secondary | ICD-10-CM

## 2019-04-28 DIAGNOSIS — S069X5S Unspecified intracranial injury with loss of consciousness greater than 24 hours with return to pre-existing conscious level, sequela: Secondary | ICD-10-CM

## 2019-04-28 DIAGNOSIS — Z23 Encounter for immunization: Secondary | ICD-10-CM

## 2019-04-28 NOTE — Progress Notes (Signed)
   Covid-19 Vaccination Clinic  Name:  Richard Bridges    MRN: [id number] DOB: 08/31/1975  04/28/2019  [name] was observed post Covid-19 immunization for 15 minutes without incident. He was provided with Vaccine Information Sheet and instruction to access the V-Safe system.   [name] was instructed to call 911 with any severe reactions post vaccine: Marland Kitchen Difficulty breathing  . Swelling of face and throat  . A fast heartbeat  . A bad rash all over body  . Dizziness and weakness   Immunizations Administered    Name Date Dose VIS Date Route   Pfizer COVID-19 Vaccine 04/28/2019 11:16 AM 0.3 mL 01/20/2019 Intramuscular   Manufacturer: [place]   Lot: [id number]   [place]: [id number]

## 2019-05-03 ENCOUNTER — Encounter: Payer: Medicare HMO | Attending: Physical Medicine & Rehabilitation | Admitting: Physical Medicine & Rehabilitation

## 2019-05-03 ENCOUNTER — Other Ambulatory Visit: Payer: Self-pay

## 2019-05-03 ENCOUNTER — Encounter: Payer: Self-pay | Admitting: Physical Medicine & Rehabilitation

## 2019-05-03 VITALS — BP 135/78 | HR 88 | Temp 97.7°F | Ht 64.0 in | Wt 177.0 lb

## 2019-05-03 DIAGNOSIS — G253 Myoclonus: Secondary | ICD-10-CM

## 2019-05-03 DIAGNOSIS — S24109S Unspecified injury at unspecified level of thoracic spinal cord, sequela: Secondary | ICD-10-CM

## 2019-05-03 DIAGNOSIS — S069X2S Unspecified intracranial injury with loss of consciousness of 31 minutes to 59 minutes, sequela: Secondary | ICD-10-CM | POA: Diagnosis not present

## 2019-05-03 DIAGNOSIS — S069X5S Unspecified intracranial injury with loss of consciousness greater than 24 hours with return to pre-existing conscious level, sequela: Secondary | ICD-10-CM

## 2019-05-03 MED ORDER — TRAMADOL HCL 50 MG PO TABS: 50.0000 mg | ORAL_TABLET | Freq: Two times a day (BID) | ORAL | 2 refills | Status: DC | PRN

## 2019-05-03 MED ORDER — CLONAZEPAM 1 MG PO TABS: 1.0000 mg | ORAL_TABLET | Freq: Every day | ORAL | 3 refills | Status: DC

## 2019-05-03 NOTE — Patient Instructions (Signed)
PLEASE FEEL FREE TO CALL OUR OFFICE WITH ANY PROBLEMS OR QUESTIONS ([phone])      

## 2019-05-03 NOTE — Progress Notes (Signed)
Subjective:    Patient ID: Richard Bridges, male    DOB: 10-24-1975, 44 y.o.   MRN: [id number]  HPI   Richard Bridges is here in follow-up of his spinal cord injury.  He continues to progress.  He has been active with his home exercise program.  He walks daily.  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  This is kept him very busy.  He has been working on improving his stamina and gait mechanics also.  He uses his right AFO and walker for every day mobility.  Pain levels are well controlled.  He will use a tramadol for more severe pain and Klonopin for his myoclonus.  Bowel bladder are fairly regulated.   Pain Inventory Average Pain 0 Pain Right Now 0 My pain is no pain  In the last 24 hours, has pain interfered with the following? General activity 0 Relation with others 0 Enjoyment of life 0 What TIME of day is your pain at its worst? no pain Sleep (in general) Good  Pain is worse with: no pain Pain improves with: no pain Relief from Meds: 6  Mobility walk with assistance use a walker how many minutes can you walk? 30  ability to climb steps?  yes do you drive?  no Do you have any goals in this area?  yes  Function not employed: date last employed .  Neuro/Psych trouble walking  Prior Studies Any changes since last visit?  no  Physicians involved in your care Any changes since last visit?  no   History reviewed. No pertinent family history. Social History   Socioeconomic History  . Marital status: Single    Spouse name: Not on file  . Number of children: Not on file  . Years of education: Not on file  . Highest education level: Not on file  Occupational History  . Not on file  Tobacco Use  . Smoking status: Former Research scientist (life sciences)  . Smokeless tobacco: Never Used  Substance and Sexual Activity  . Alcohol use: Yes  . Drug use: No  . Sexual activity: Not on file  Other Topics Concern  . Not on file  Social History Narrative  . Not on file    Social Determinants of Health   Financial Resource Strain:   . Difficulty of Paying Living Expenses:   Food Insecurity:   . Worried About Charity fundraiser in the Last Year:   . Arboriculturist in the Last Year:   Transportation Needs:   . Film/video editor (Medical):   Marland Kitchen Lack of Transportation (Non-Medical):   Physical Activity:   . Days of Exercise per Week:   . Minutes of Exercise per Session:   Stress:   . Feeling of Stress :   Social Connections:   . Frequency of Communication with Friends and Family:   . Frequency of Social Gatherings with Friends and Family:   . Attends Religious Services:   . Active Member of Clubs or Organizations:   . Attends Archivist Meetings:   Marland Kitchen Marital Status:    Past Surgical History:  Procedure Laterality Date  . SPINE SURGERY  04/15/2014   laminectomy s/p multi-trauma  . 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 Fractures involving multiple body regions 04/14/2014   s/p multi-trauma with pelvic and spinal fractures   BP 135/78   Pulse 88  Temp 97.7 F (36.5 C)   Ht 5\' 4"  (1.626 m)   Wt 177 lb (80.3 kg)   SpO2 98%   BMI 30.38 kg/m   Opioid Risk Score:   Fall Risk Score:  `1  Depression screen PHQ 2/9  Depression screen [facility] 2/9 12/30/2015 04/10/2015 03/13/2015  Decreased Interest 0 0 0  Down, Depressed, Hopeless 0 0 0  PHQ - 2 Score 0 0 0  Altered sleeping - - 0  Tired, decreased energy - - 0  Change in appetite - - 0  Feeling bad or failure about yourself  - - 0  Trouble concentrating - - 0  Moving slowly or fidgety/restless - - 0  Suicidal thoughts - - 0  PHQ-9 Score - - 0    Review of Systems  Constitutional: Negative.   HENT: Negative.   Eyes: Negative.   Respiratory: Negative.   Cardiovascular: Negative.   Gastrointestinal: Negative.   Endocrine: Negative.   Genitourinary: Negative.   Musculoskeletal: Positive for gait problem.  Skin: Negative.    Allergic/Immunologic: Negative.   Hematological: Negative.   Psychiatric/Behavioral: Negative.   All other systems reviewed and are negative.      Objective:   Physical Exam General: No acute distress HEENT: EOMI, oral membranes moist Cards: reg rate  Chest: normal effort Abdomen: Soft, NT, ND Skin: dry, intact Extremities: no edema Neuro:  Cognitively is appropriate with normal insight and awareness.  Cranial nerve exam intact.  He has some diminishment of his light touch in both legs.  Lower extremity strength is nearly 4 out of 5 in both hip flexors and knee extensors as well as hamstrings.  Right ankle dorsiflexion and plantarflexion is still 2+ to 3 out of 5.  He is 3+ to 4+ out of 5 left lower extremity distally.  He walks with his AFO today and demonstrates good knee extension as well as toe lift.  He tends to strike with the right heel as well.  Minimal recurvatum noted at the right knee.  No circumduction today.  Minimal external rotation of the right leg.    Musculoskeletal: Full ROM, No pain with AROM or PROM in the neck, trunk, or extremities. Posture appropriate in sitting Psych:  Pleasant and cooperative      Assessment & Plan:  1. 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  2. 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 Neurogenic Bladder with urinary retention     Plan:   1. Maintain HEP as possible.             -Has made nice progress with his gait.  His work shows.  Continue to focus on mechanics.  He should be able to increase his strength and stamina over time as well to a certain extent.         -can go without AFO for in house/therapeutic gait          -Could consider aquatic therapy again later this year or next 2. Klonopin for myoclonus and resting tone continue at 1mg  daily-this was refilled for next month            3. Continue ultram which he takes at bedtime only-- refill this  today  We will continue the controlled substance monitoring program, this consists of regular clinic visits, examinations, routine drug screening, pill counts as well as use of [place] Controlled Substance Reporting System. [place].  4. Urological follow up per Dr. Alyson Ingles.            -voiding now with occasional I/O caths 5. Follow up with me in about 6 months. 15 minutes of face to face patient care time were spent during this visit. All questions were encouraged and answered.

## 2019-05-23 ENCOUNTER — Ambulatory Visit: Payer: Medicare HMO | Attending: Internal Medicine

## 2019-05-23 DIAGNOSIS — Z23 Encounter for immunization: Secondary | ICD-10-CM

## 2019-05-23 NOTE — Progress Notes (Signed)
   Covid-19 Vaccination Clinic  Name:  Richard Bridges    MRN: [id number] DOB: October 24, 1975  05/23/2019  [name] was observed post Covid-19 immunization for 15 minutes without incident. He was provided with Vaccine Information Sheet and instruction to access the V-Safe system.   [name] was instructed to call 911 with any severe reactions post vaccine: Marland Kitchen Difficulty breathing  . Swelling of face and throat  . A fast heartbeat  . A bad rash all over body  . Dizziness and weakness   Immunizations Administered    Name Date Dose VIS Date Route   Pfizer COVID-19 Vaccine 05/23/2019 11:52 AM 0.3 mL 01/20/2019 Intramuscular   Manufacturer: [place]   Lot: [id number]   [place]: [id number]

## 2019-06-15 DIAGNOSIS — R69 Illness, unspecified: Secondary | ICD-10-CM | POA: Diagnosis not present

## 2019-06-28 DIAGNOSIS — R69 Illness, unspecified: Secondary | ICD-10-CM | POA: Diagnosis not present

## 2019-07-21 DIAGNOSIS — D473 Essential (hemorrhagic) thrombocythemia: Secondary | ICD-10-CM | POA: Diagnosis not present

## 2019-07-21 DIAGNOSIS — B360 Pityriasis versicolor: Secondary | ICD-10-CM | POA: Diagnosis not present

## 2019-07-21 DIAGNOSIS — G253 Myoclonus: Secondary | ICD-10-CM | POA: Diagnosis not present

## 2019-07-21 DIAGNOSIS — S24109S Unspecified injury at unspecified level of thoracic spinal cord, sequela: Secondary | ICD-10-CM | POA: Diagnosis not present

## 2019-07-21 DIAGNOSIS — E782 Mixed hyperlipidemia: Secondary | ICD-10-CM | POA: Diagnosis not present

## 2019-07-21 DIAGNOSIS — S062X2S Diffuse traumatic brain injury with loss of consciousness of 31 minutes to 59 minutes, sequela: Secondary | ICD-10-CM | POA: Diagnosis not present

## 2019-07-24 DIAGNOSIS — Z0000 Encounter for general adult medical examination without abnormal findings: Secondary | ICD-10-CM | POA: Diagnosis not present

## 2019-07-24 DIAGNOSIS — E782 Mixed hyperlipidemia: Secondary | ICD-10-CM | POA: Diagnosis not present

## 2019-09-28 ENCOUNTER — Other Ambulatory Visit: Payer: Self-pay | Admitting: Physical Medicine & Rehabilitation

## 2019-09-28 DIAGNOSIS — S069X5S Unspecified intracranial injury with loss of consciousness greater than 24 hours with return to pre-existing conscious level, sequela: Secondary | ICD-10-CM

## 2019-09-28 DIAGNOSIS — S24109S Unspecified injury at unspecified level of thoracic spinal cord, sequela: Secondary | ICD-10-CM

## 2019-10-13 DIAGNOSIS — R05 Cough: Secondary | ICD-10-CM | POA: Diagnosis not present

## 2019-10-13 DIAGNOSIS — Z03818 Encounter for observation for suspected exposure to other biological agents ruled out: Secondary | ICD-10-CM | POA: Diagnosis not present

## 2019-11-01 ENCOUNTER — Other Ambulatory Visit: Payer: Self-pay

## 2019-11-01 ENCOUNTER — Encounter: Payer: Self-pay | Admitting: Physical Medicine & Rehabilitation

## 2019-11-01 ENCOUNTER — Encounter: Payer: Medicare HMO | Attending: Physical Medicine & Rehabilitation | Admitting: Physical Medicine & Rehabilitation

## 2019-11-01 VITALS — BP 136/87 | HR 108 | Temp 98.7°F | Ht 64.0 in | Wt 170.8 lb

## 2019-11-01 DIAGNOSIS — S069X2S Unspecified intracranial injury with loss of consciousness of 31 minutes to 59 minutes, sequela: Secondary | ICD-10-CM | POA: Insufficient documentation

## 2019-11-01 DIAGNOSIS — S069X5S Unspecified intracranial injury with loss of consciousness greater than 24 hours with return to pre-existing conscious level, sequela: Secondary | ICD-10-CM | POA: Diagnosis not present

## 2019-11-01 DIAGNOSIS — G839 Paralytic syndrome, unspecified: Secondary | ICD-10-CM | POA: Insufficient documentation

## 2019-11-01 DIAGNOSIS — S24109S Unspecified injury at unspecified level of thoracic spinal cord, sequela: Secondary | ICD-10-CM | POA: Diagnosis not present

## 2019-11-01 DIAGNOSIS — N319 Neuromuscular dysfunction of bladder, unspecified: Secondary | ICD-10-CM | POA: Insufficient documentation

## 2019-11-01 DIAGNOSIS — S069X3S Unspecified intracranial injury with loss of consciousness of 1 hour to 5 hours 59 minutes, sequela: Secondary | ICD-10-CM | POA: Insufficient documentation

## 2019-11-01 MED ORDER — TRAMADOL HCL 50 MG PO TABS: 50.0000 mg | ORAL_TABLET | Freq: Two times a day (BID) | ORAL | 2 refills | Status: DC | PRN

## 2019-11-01 MED ORDER — CLONAZEPAM 1 MG PO TABS: 1.0000 mg | ORAL_TABLET | Freq: Every day | ORAL | 3 refills | Status: DC

## 2019-11-01 NOTE — Patient Instructions (Signed)
PLEASE FEEL FREE TO CALL OUR OFFICE WITH ANY PROBLEMS OR QUESTIONS ([phone])      

## 2019-11-01 NOTE — Progress Notes (Signed)
Subjective:    Patient ID: Richard Bridges, male    DOB: 30-Jan-1976, 44 y.o.   MRN: [id number]  HPI    Richard Bridges is here in follow-up of Richard Bridges thoracic spinal cord injury.  I last saw Richard Bridges in March of this past year.  Richard Bridges has been doing very well for the most part.  Richard Bridges stays active outside and with regular exercise.  Richard Bridges has a garden that Richard Bridges tends to.  Richard Bridges is walking with Richard Bridges right AFO and doing well with this.  Has had some problems with the heels of Richard Bridges shoe separating due to Richard Bridges gait pattern.  Richard Bridges denies substantial pain at this point.  Richard Bridges is emptying Richard Bridges bladder very well without any further need for intermittent catheterization.  Richard Bridges last caths himself about a year ago.  Bowels are functioning appropriately.  Uses clonazepam for spasms as well as tramadol for breakthrough pain.  Pain Inventory Average Pain 0 Pain Right Now 0 My pain is no pain  In the last 24 hours, has pain interfered with the following? General activity 0 Relation with others 0 Enjoyment of life 0 What TIME of day is your pain at its worst? varies Sleep (in general) Good  Pain is worse with: no pain Pain improves with: no pain Relief from Meds: no pain  History reviewed. No pertinent family history. Social History   Socioeconomic History  . Marital status: Single    Spouse name: Not on file  . Number of children: Not on file  . Years of education: Not on file  . Highest education level: Not on file  Occupational History  . Not on file  Tobacco Use  . Smoking status: Former Research scientist (life sciences)  . Smokeless tobacco: Never Used  Substance and Sexual Activity  . Alcohol use: Yes  . Drug use: No  . Sexual activity: Not on file  Other Topics Concern  . Not on file  Social History Narrative  . Not on file   Social Determinants of Health   Financial Resource Strain:   . Difficulty of Paying Living Expenses: Not on file  Food Insecurity:   . Worried About Charity fundraiser in the Last Year: Not on file  .  Ran Out of Food in the Last Year: Not on file  Transportation Needs:   . Lack of Transportation (Medical): Not on file  . Lack of Transportation (Non-Medical): Not on file  Physical Activity:   . Days of Exercise per Week: Not on file  . Minutes of Exercise per Session: Not on file  Stress:   . Feeling of Stress : Not on file  Social Connections:   . Frequency of Communication with Friends and Family: Not on file  . Frequency of Social Gatherings with Friends and Family: Not on file  . Attends Religious Services: Not on file  . Active Member of Clubs or Organizations: Not on file  . Attends Archivist Meetings: Not on file  . Marital Status: Not on file   Past Surgical History:  Procedure Laterality Date  . SPINE SURGERY  04/15/2014   laminectomy s/p multi-trauma  . 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   Past Surgical History:  Procedure Laterality Date  . SPINE SURGERY  04/15/2014   laminectomy s/p multi-trauma  . 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 Fractures involving multiple body regions 04/14/2014   s/p multi-trauma with pelvic and  spinal fractures   BP 136/87   Pulse (!) 108   Temp 98.7 F (37.1 C)   Ht 5\' 4"  (1.626 m)   Wt 170 lb 12.8 oz (77.5 kg)   SpO2 98%   BMI 29.32 kg/m   Opioid Risk Score:   Fall Risk Score:  `1  Depression screen PHQ 2/9  Depression screen [facility] 2/9 11/01/2019 12/30/2015 04/10/2015 03/13/2015  Decreased Interest 0 0 0 0  Down, Depressed, Hopeless 0 0 0 0  PHQ - 2 Score 0 0 0 0  Altered sleeping - - - 0  Tired, decreased energy - - - 0  Change in appetite - - - 0  Feeling bad or failure about yourself  - - - 0  Trouble concentrating - - - 0  Moving slowly or fidgety/restless - - - 0  Suicidal thoughts - - - 0  PHQ-9 Score - - - 0    Review of Systems  Constitutional: Negative.   HENT: Negative.   Eyes: Negative.   Respiratory: Negative.   Cardiovascular: Negative.    Gastrointestinal: Negative.   Endocrine: Negative.   Genitourinary: Negative.   Musculoskeletal:       Muscle shakes  Allergic/Immunologic: Negative.   Neurological: Negative.   Hematological: Negative.   Psychiatric/Behavioral: Negative.   All other systems reviewed and are negative.      Objective:   Physical Exam  General: No acute distress HEENT: EOMI, oral membranes moist Cards: reg rate  Chest: normal effort Abdomen: Soft, NT, ND Skin: dry, intact Extremities: no edema Neuro:  Cognitively is appropriate with normal insight and awareness.  Cranial nerve exam intact.  Richard Bridges has some diminishment of Richard Bridges light touch in both legs.  Lower extremity strength is nearly 4 out of 5 in both hip flexors and knee extensors as well as hamstrings.  Right ankle dorsiflexion and plantarflexion is still 3- out of 5.  Richard Bridges is 3+ to 4 out of 5 left lower extremity distally.  improved gait, strikes with heel nicely, sometimes to excess Musculoskeletal: Full ROM, No pain with AROM or PROM in the neck, trunk, or extremities. Posture appropriate in sitting Psych:  pleasant     Assessment & Plan:  1. 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  2. 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 Neurogenic Bladder with urinary retention     Plan:   1. Maintain HEP as possible.             -continue activity as possible. Richard Bridges does a good job getting exercise         -can go without AFO for in house/therapeutic gait          -still can look at aquatic therapy in the future 2. Klonopin for myoclonus and resting tone continue at 1mg  daily-this was refilled for next month            3. Continue ultram which Richard Bridges takes at bedtime only-- refill this today         -We will continue the controlled substance monitoring program, this consists of regular clinic visits, examinations, routine drug screening, pill counts as well as use of [place] Controlled Substance Reporting System. [place]CCS[place] was reviewed today.   4. Urological follow up per Dr. Alyson Ingles prn            -voiding now without any caths, Richard Bridges is emptying fully.  5. Follow up with  me in about 6 months. 15 minutes of face to face patient care time were spent during this visit. All questions were encouraged and answered.

## 2019-11-21 DIAGNOSIS — R69 Illness, unspecified: Secondary | ICD-10-CM | POA: Diagnosis not present

## 2020-01-22 DIAGNOSIS — R0981 Nasal congestion: Secondary | ICD-10-CM | POA: Diagnosis not present

## 2020-01-22 DIAGNOSIS — U071 COVID-19: Secondary | ICD-10-CM | POA: Diagnosis not present

## 2020-01-23 ENCOUNTER — Other Ambulatory Visit (HOSPITAL_COMMUNITY): Payer: Self-pay

## 2020-01-24 ENCOUNTER — Encounter: Payer: Self-pay | Admitting: Nurse Practitioner

## 2020-01-24 ENCOUNTER — Other Ambulatory Visit: Payer: Self-pay | Admitting: Nurse Practitioner

## 2020-01-24 DIAGNOSIS — U071 COVID-19: Secondary | ICD-10-CM

## 2020-01-24 NOTE — Progress Notes (Signed)
I connected by phone with Richard Bridges on 01/24/2020 at 6:56 PM to discuss the potential use of a treatment for mild to moderate COVID-19 viral infection in non-hospitalized patients.  This patient is a 44 y.o. male that meets the FDA criteria for Emergency Use Authorization of bamlanivimab/etesevimab, casirivimab\imdevimab, or sotrovimab  Has a (+) direct SARS-CoV-2 viral test result  Has mild or moderate COVID-19   Is ? 43 years of age and weighs ? 40 kg  Is NOT hospitalized due to COVID-19  Is NOT requiring oxygen therapy or requiring an increase in baseline oxygen flow rate due to COVID-19  Is within 10 days of symptom onset  Has at least one of the high risk factor(s) for progression to severe COVID-19 and/or hospitalization as defined in EUA.  Specific high risk criteria : Other high risk medical condition per CDC:  history of TBI   I have spoken and communicated the following to the patient or parent/caregiver:  1. FDA has authorized the emergency use of bamlanivimab/etesevimab, casirivimab\imdevimab, or sotrovimab for the treatment of mild to moderate COVID-19 in adults and pediatric patients with positive results of direct SARS-CoV-2 viral testing who are 44 years of age and older weighing at least 40 kg, and who are at high risk for progressing to severe COVID-19 and/or hospitalization.  2. The significant known and potential risks and benefits of bamlanivimab/etesevimab, casirivimab\imdevimab, or sotrovimab, and the extent to which such potential risks and benefits are unknown.  3. Information on available alternative treatments and the risks and benefits of those alternatives, including clinical trials.  4. Patients treated with bamlanivimab/etesevimab, casirivimab\imdevimab, or sotrovimab should continue to self-isolate and use infection control measures (e.g., wear mask, isolate, social distance, avoid sharing personal items, clean and disinfect "high touch" surfaces,  and frequent handwashing) according to CDC guidelines.   5. The patient or parent/caregiver has the option to accept or refuse bamlanivimab/etesevimab, casirivimab\imdevimab, or sotrovimab.  After reviewing this information with the patient, the patient has agreed to receive one of the available covid 19 monoclonal antibodies and will be provided an appropriate fact sheet prior to infusion.Beckey Rutter, [place], AGNP-C [phone] ([place])

## 2020-01-25 ENCOUNTER — Ambulatory Visit (HOSPITAL_COMMUNITY): Payer: Medicare HMO

## 2020-01-25 ENCOUNTER — Ambulatory Visit (HOSPITAL_COMMUNITY)
Admission: RE | Admit: 2020-01-25 | Discharge: 2020-01-25 | Disposition: A | Discharge status: Home / Self Care | Payer: Medicare Other | Source: Ambulatory Visit | Attending: Pulmonary Disease | Admitting: Pulmonary Disease

## 2020-01-25 DIAGNOSIS — U071 COVID-19: Secondary | ICD-10-CM | POA: Diagnosis present

## 2020-01-25 DIAGNOSIS — Z23 Encounter for immunization: Secondary | ICD-10-CM | POA: Insufficient documentation

## 2020-01-25 MED ORDER — SODIUM CHLORIDE 0.9 % IV SOLN: Freq: Once | INTRAVENOUS | Status: AC

## 2020-01-25 MED ORDER — METHYLPREDNISOLONE SODIUM SUCC 125 MG IJ SOLR: 125.0000 mg | Freq: Once | INTRAMUSCULAR | Status: DC | PRN

## 2020-01-25 MED ORDER — FAMOTIDINE IN NACL 20-0.9 MG/50ML-% IV SOLN: 20.0000 mg | Freq: Once | INTRAVENOUS | Status: DC | PRN

## 2020-01-25 MED ORDER — SODIUM CHLORIDE 0.9 % IV SOLN: INTRAVENOUS | Status: DC | PRN

## 2020-01-25 MED ORDER — DIPHENHYDRAMINE HCL 50 MG/ML IJ SOLN: 50.0000 mg | Freq: Once | INTRAMUSCULAR | Status: DC | PRN

## 2020-01-25 MED ORDER — EPINEPHRINE 0.3 MG/0.3ML IJ SOAJ: 0.3000 mg | Freq: Once | INTRAMUSCULAR | Status: DC | PRN

## 2020-01-25 MED ORDER — ALBUTEROL SULFATE HFA 108 (90 BASE) MCG/ACT IN AERS: 2.0000 | INHALATION_SPRAY | Freq: Once | RESPIRATORY_TRACT | Status: DC | PRN

## 2020-01-25 NOTE — Discharge Instructions (Signed)
10 Things You Can Do to Manage Your COVID-19 Symptoms at Home If you have possible or confirmed COVID-19: 1. Stay home from work and school. And stay away from other public places. If you must go out, avoid using any kind of public transportation, ridesharing, or taxis. 2. Monitor your symptoms carefully. If your symptoms get worse, call your healthcare provider immediately. 3. Get rest and stay hydrated. 4. If you have a medical appointment, call the healthcare provider ahead of time and tell them that you have or may have COVID-19. 5. For medical emergencies, call 911 and notify the dispatch personnel that you have or may have COVID-19. 6. Cover your cough and sneezes with a tissue or use the inside of your elbow. 7. Wash your hands often with soap and water for at least 20 seconds or clean your hands with an alcohol-based hand sanitizer that contains at least 60% alcohol. 8. As much as possible, stay in a specific room and away from other people in your home. Also, you should use a separate bathroom, if available. If you need to be around other people in or outside of the home, wear a mask. 9. Avoid sharing personal items with other people in your household, like dishes, towels, and bedding. 10. Clean all surfaces that are touched often, like counters, tabletops, and doorknobs. Use household cleaning sprays or wipes according to the label instructions. cdc.gov/coronavirus 08/10/2018 This information is not intended to replace advice given to you by your health care provider. Make sure you discuss any questions you have with your health care provider. Document Revised: 01/12/2019 Document Reviewed: 01/12/2019 Elsevier Patient Education  2020 Elsevier Inc. What types of side effects do monoclonal antibody drugs cause?  Common side effects  In general, the more common side effects caused by monoclonal antibody drugs include: . Allergic reactions, such as hives or itching . Flu-like signs and  symptoms, including chills, fatigue, fever, and muscle aches and pains . Nausea, vomiting . Diarrhea . Skin rashes . Low blood pressure   The CDC is recommending patients who receive monoclonal antibody treatments wait at least 90 days before being vaccinated.  Currently, there are no data on the safety and efficacy of mRNA COVID-19 vaccines in persons who received monoclonal antibodies or convalescent plasma as part of COVID-19 treatment. Based on the estimated half-life of such therapies as well as evidence suggesting that reinfection is uncommon in the 90 days after initial infection, vaccination should be deferred for at least 90 days, as a precautionary measure until additional information becomes available, to avoid interference of the antibody treatment with vaccine-induced immune responses. If you have any questions or concerns after the infusion please call the Advanced Practice Provider on call at [phone]. This number is ONLY intended for your use regarding questions or concerns about the infusion post-treatment side-effects.  Please do not provide this number to others for use. For return to work notes please contact your primary care provider.   If someone you know is interested in receiving treatment please have them call the COVID hotline at [phone].   

## 2020-01-25 NOTE — Progress Notes (Signed)
Patient reviewed Fact Sheet for Patients, Parents, and Caregivers for Emergency Use Authorization (EUA) of bamlanivimab and etesevimab for the Treatment of Coronavirus. Patient also reviewed and is agreeable to the estimated cost of treatment. Patient is agreeable to proceed.   

## 2020-01-25 NOTE — Progress Notes (Signed)
  Diagnosis: COVID-19  Physician: Asencion Noble, MD  Procedure: [facility] Infusion [facility] Med: bamlanivimab\etesevimab infusion - Provided patient with bamlanimivab\etesevimab fact sheet for patients, parents and caregivers prior to infusion.  Complications: No immediate complications noted.  Discharge: Discharged home   Richard Bridges 01/25/2020

## 2020-03-28 ENCOUNTER — Other Ambulatory Visit: Payer: Self-pay | Admitting: Physical Medicine & Rehabilitation

## 2020-03-28 DIAGNOSIS — S069X5S Unspecified intracranial injury with loss of consciousness greater than 24 hours with return to pre-existing conscious level, sequela: Secondary | ICD-10-CM

## 2020-03-28 DIAGNOSIS — S24109S Unspecified injury at unspecified level of thoracic spinal cord, sequela: Secondary | ICD-10-CM

## 2020-04-02 ENCOUNTER — Other Ambulatory Visit: Payer: Self-pay | Admitting: Physical Medicine & Rehabilitation

## 2020-04-02 DIAGNOSIS — S069X2S Unspecified intracranial injury with loss of consciousness of 31 minutes to 59 minutes, sequela: Secondary | ICD-10-CM

## 2020-04-02 DIAGNOSIS — S069X5S Unspecified intracranial injury with loss of consciousness greater than 24 hours with return to pre-existing conscious level, sequela: Secondary | ICD-10-CM

## 2020-04-02 DIAGNOSIS — S24109S Unspecified injury at unspecified level of thoracic spinal cord, sequela: Secondary | ICD-10-CM

## 2020-05-01 ENCOUNTER — Other Ambulatory Visit: Payer: Self-pay

## 2020-05-01 ENCOUNTER — Encounter: Payer: Medicare HMO | Attending: Physical Medicine & Rehabilitation | Admitting: Physical Medicine & Rehabilitation

## 2020-05-01 ENCOUNTER — Encounter: Payer: Self-pay | Admitting: Physical Medicine & Rehabilitation

## 2020-05-01 VITALS — BP 134/82 | HR 111 | Temp 98.9°F | Ht 64.0 in | Wt 169.0 lb

## 2020-05-01 DIAGNOSIS — G253 Myoclonus: Secondary | ICD-10-CM | POA: Insufficient documentation

## 2020-05-01 DIAGNOSIS — N319 Neuromuscular dysfunction of bladder, unspecified: Secondary | ICD-10-CM | POA: Diagnosis not present

## 2020-05-01 DIAGNOSIS — G839 Paralytic syndrome, unspecified: Secondary | ICD-10-CM | POA: Diagnosis not present

## 2020-05-01 NOTE — Patient Instructions (Signed)
PLEASE FEEL FREE TO CALL OUR OFFICE WITH ANY PROBLEMS OR QUESTIONS ([phone])      

## 2020-05-01 NOTE — Progress Notes (Signed)
Subjective:    Patient ID: Richard Bridges, male    DOB: 1975-04-01, 45 y.o.   MRN: [id number]  HPI   Richard Bridges is here in follow up of his thoracic spinal cord injury. He has been active at home. He has a garden. 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 He's active with his church.    His pain levels have been minimal. Spasms are under control. He has myoclonus onloy when he's fatigued. He uses tramadol for pain control if he needs it.   Bladder remains functional although he has urgency. He rarely has to cath.      Pain Inventory Average Pain 0 Pain Right Now 0 My pain is intermittent  In the last 24 hours, has pain interfered with the following? General activity 0 Relation with others 0 Enjoyment of life 0 What TIME of day is your pain at its worst? varies Sleep (in general) Good  Pain is worse with: unsure Pain improves with: medication Relief from Meds: 8  History reviewed. No pertinent family history. Social History   Socioeconomic History  . Marital status: Single    Spouse name: Not on file  . Number of children: Not on file  . Years of education: Not on file  . Highest education level: Not on file  Occupational History  . Not on file  Tobacco Use  . Smoking status: Former Research scientist (life sciences)  . Smokeless tobacco: Never Used  Substance and Sexual Activity  . Alcohol use: Yes  . Drug use: No  . Sexual activity: Not on file  Other Topics Concern  . Not on file  Social History Narrative  . Not on file   Social Determinants of Health   Financial Resource Strain: Not on file  Food Insecurity: Not on file  Transportation Needs: Not on file  Physical Activity: Not on file  Stress: Not on file  Social Connections: Not on file   Past Surgical History:  Procedure Laterality Date  . SPINE SURGERY  04/15/2014   laminectomy s/p multi-trauma  . 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   Past Surgical History:  Procedure Laterality Date  . SPINE SURGERY   04/15/2014   laminectomy s/p multi-trauma  . 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 Fractures involving multiple body regions 04/14/2014   s/p multi-trauma with pelvic and spinal fractures   BP 134/82   Pulse (!) 111   Temp 98.9 F (37.2 C)   Ht 5\' 4"  (1.626 m)   Wt 169 lb (76.7 kg)   SpO2 95%   BMI 29.01 kg/m   Opioid Risk Score:   Fall Risk Score:  `1  Depression screen PHQ 2/9  Depression screen [facility] 2/9 11/01/2019 12/30/2015 04/10/2015 03/13/2015  Decreased Interest 0 0 0 0  Down, Depressed, Hopeless 0 0 0 0  PHQ - 2 Score 0 0 0 0  Altered sleeping - - - 0  Tired, decreased energy - - - 0  Change in appetite - - - 0  Feeling bad or failure about yourself  - - - 0  Trouble concentrating - - - 0  Moving slowly or fidgety/restless - - - 0  Suicidal thoughts - - - 0  PHQ-9 Score - - - 0    Review of Systems  Constitutional: Negative.   HENT: Negative.   Eyes: Negative.   Respiratory: Negative.   Cardiovascular: Negative.   Gastrointestinal: Negative.  Endocrine: Negative.   Genitourinary: Negative.   Musculoskeletal: Positive for gait problem.  Skin: Negative.   Allergic/Immunologic: Negative.   Neurological: Positive for tremors.  Hematological: Negative.   Psychiatric/Behavioral: Negative.   All other systems reviewed and are negative.      Objective:   Physical Exam  General: No acute distress HEENT: EOMI, oral membranes moist Cards: reg rate  Chest: normal effort Abdomen: Soft, NT, ND Skin: dry, intact Extremities: no edema Psych: pleasant and appropriate Neuro:  Cognitively is appropriate with normal insight and awareness.  Cranial nerve exam intact.  He has some diminishment of his light touch in both legs.  Lower extremity strength is nearly 4 out of 5 in both hip flexors and knee extensors as well as hamstrings.  Right ankle dorsiflexion and plantarflexion is still 3- out of 5.  He is 3+ to 4 out of 5  left lower extremity distally.  improved gait, strikes with heel nicely, sometimes to excess Musculoskeletal: Full ROM, No pain with AROM or PROM in the neck, trunk, or extremities. Posture appropriate     Assessment & Plan:  1. 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  2. 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 Neurogenic Bladder with urinary retention     Plan:   1. Maintain HEP as possible.             -continue activity as possible. He does a good job getting exercise 2. Klonopin for myoclonus and resting tone continue at 1mg  daily-this was refilled for next month            3. Continue ultram which he takes at bedtime only-- refill this today         -We will continue the controlled substance monitoring program, this consists of regular clinic visits, examinations, routine drug screening, pill counts as well as use of [place] Controlled Substance Reporting System. NCCS[place] was reviewed today.   4. Urological follow up per Dr. Alyson Ingles prn            -voiding now without any caths essentially     Fifteen minutes of face to face patient care time were spent during this visit. All questions were encouraged and answered.  Follow up with me in 6 mos .

## 2020-05-28 ENCOUNTER — Other Ambulatory Visit: Payer: Self-pay | Admitting: Physical Medicine & Rehabilitation

## 2020-05-28 DIAGNOSIS — S24109S Unspecified injury at unspecified level of thoracic spinal cord, sequela: Secondary | ICD-10-CM

## 2020-05-28 DIAGNOSIS — S069X2S Unspecified intracranial injury with loss of consciousness of 31 minutes to 59 minutes, sequela: Secondary | ICD-10-CM

## 2020-05-28 DIAGNOSIS — S069X5S Unspecified intracranial injury with loss of consciousness greater than 24 hours with return to pre-existing conscious level, sequela: Secondary | ICD-10-CM

## 2020-07-23 DIAGNOSIS — S24109S Unspecified injury at unspecified level of thoracic spinal cord, sequela: Secondary | ICD-10-CM | POA: Diagnosis not present

## 2020-07-23 DIAGNOSIS — Z0000 Encounter for general adult medical examination without abnormal findings: Secondary | ICD-10-CM | POA: Diagnosis not present

## 2020-07-23 DIAGNOSIS — Z9081 Acquired absence of spleen: Secondary | ICD-10-CM | POA: Diagnosis not present

## 2020-07-23 DIAGNOSIS — D473 Essential (hemorrhagic) thrombocythemia: Secondary | ICD-10-CM | POA: Diagnosis not present

## 2020-07-23 DIAGNOSIS — S062X2S Diffuse traumatic brain injury with loss of consciousness of 31 minutes to 59 minutes, sequela: Secondary | ICD-10-CM | POA: Diagnosis not present

## 2020-07-23 DIAGNOSIS — E782 Mixed hyperlipidemia: Secondary | ICD-10-CM | POA: Diagnosis not present

## 2020-07-24 DIAGNOSIS — D473 Essential (hemorrhagic) thrombocythemia: Secondary | ICD-10-CM | POA: Diagnosis not present

## 2020-07-24 DIAGNOSIS — E782 Mixed hyperlipidemia: Secondary | ICD-10-CM | POA: Diagnosis not present

## 2020-07-24 DIAGNOSIS — Z9081 Acquired absence of spleen: Secondary | ICD-10-CM | POA: Diagnosis not present

## 2020-07-26 ENCOUNTER — Other Ambulatory Visit: Payer: Self-pay | Admitting: Physical Medicine & Rehabilitation

## 2020-07-26 DIAGNOSIS — S069X5S Unspecified intracranial injury with loss of consciousness greater than 24 hours with return to pre-existing conscious level, sequela: Secondary | ICD-10-CM

## 2020-07-26 DIAGNOSIS — S24109S Unspecified injury at unspecified level of thoracic spinal cord, sequela: Secondary | ICD-10-CM

## 2020-10-30 ENCOUNTER — Encounter: Payer: Self-pay | Admitting: Physical Medicine & Rehabilitation

## 2020-10-30 ENCOUNTER — Encounter: Payer: Medicare HMO | Attending: Physical Medicine & Rehabilitation | Admitting: Physical Medicine & Rehabilitation

## 2020-10-30 ENCOUNTER — Other Ambulatory Visit: Payer: Self-pay

## 2020-10-30 VITALS — BP 146/92 | HR 109 | Ht 64.0 in | Wt 174.4 lb

## 2020-10-30 DIAGNOSIS — G839 Paralytic syndrome, unspecified: Secondary | ICD-10-CM | POA: Diagnosis not present

## 2020-10-30 DIAGNOSIS — S069X5S Unspecified intracranial injury with loss of consciousness greater than 24 hours with return to pre-existing conscious level, sequela: Secondary | ICD-10-CM | POA: Diagnosis not present

## 2020-10-30 DIAGNOSIS — S069X2S Unspecified intracranial injury with loss of consciousness of 31 minutes to 59 minutes, sequela: Secondary | ICD-10-CM | POA: Diagnosis not present

## 2020-10-30 DIAGNOSIS — S24109S Unspecified injury at unspecified level of thoracic spinal cord, sequela: Secondary | ICD-10-CM | POA: Diagnosis not present

## 2020-10-30 DIAGNOSIS — N319 Neuromuscular dysfunction of bladder, unspecified: Secondary | ICD-10-CM

## 2020-10-30 MED ORDER — TRAMADOL HCL 50 MG PO TABS: ORAL_TABLET | ORAL | 2 refills | Status: DC

## 2020-10-30 MED ORDER — CLONAZEPAM 1 MG PO TABS: 1.0000 mg | ORAL_TABLET | ORAL | 2 refills | Status: DC

## 2020-10-30 NOTE — Progress Notes (Signed)
Subjective:    Patient ID: Richard Bridges, male    DOB: 01-08-1976, 45 y.o.   MRN: [id number]  HPI Richard Bridges is here in follow-up of his spinal cord injury and brain injury.  He continues to do quite well.  He still has some spasms and clonus in the lower extremities but they are fairly controlled with his Klonopin.  He takes milligram in the morning and half a milligram at nighttime.  Uses tramadol for pain.  He remains active walking regularly.  He does for stretching and exercise program a few days a week as well.  He likes to get out in his garden and had a very productive crop this past summer.  He is going to be traveling a little bit this fall and is excited about that.  He reports no problems with his skin.  Bowel bladder function remained stable.  Appetite is good.  Mood is remained positive.  Pain Inventory Average Pain 0 Pain Right Now 0 My pain is  No pain  In the last 24 hours, has pain interfered with the following? General activity 0 Relation with others 0 Enjoyment of life 0 What TIME of day is your pain at its worst? No pain Sleep (in general) Good  Pain is worse with:  no pain Pain improves with:  no pain Relief from Meds: 10  No family history on file. Social History   Socioeconomic History   Marital status: Single    Spouse name: Not on file   Number of children: Not on file   Years of education: Not on file   Highest education level: Not on file  Occupational History   Not on file  Tobacco Use   Smoking status: Former   Smokeless tobacco: Never  Substance and Sexual Activity   Alcohol use: Yes   Drug use: No   Sexual activity: Not on file  Other Topics Concern   Not on file  Social History Narrative   Not on file   Social Determinants of Health   Financial Resource Strain: Not on file  Food Insecurity: Not on file  Transportation Needs: Not on file  Physical Activity: Not on file  Stress: Not on file  Social Connections: Not on file   Past  Surgical History:  Procedure Laterality Date   SPINE SURGERY  04/15/2014   laminectomy s/p multi-trauma   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   Past Surgical History:  Procedure Laterality Date   SPINE SURGERY  04/15/2014   laminectomy s/p multi-trauma   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  Opioid Risk Score:   Fall Risk Score:  `1  Depression screen PHQ 2/9  Depression screen [facility] 2/9 11/01/2019 12/30/2015 04/10/2015 03/13/2015  Decreased Interest 0 0 0 0  Down, Depressed, Hopeless 0 0 0 0  PHQ - 2 Score 0 0 0 0  Altered sleeping - - - 0  Tired, decreased energy - - - 0  Change in appetite - - - 0  Feeling bad or failure about yourself  - - - 0  Trouble concentrating - - - 0  Moving slowly or fidgety/restless - - - 0  Suicidal thoughts - - - 0  PHQ-9 Score - - - 0    Review of  Systems  All other systems reviewed and are negative.     Objective:   Physical Exam General: No acute distress HEENT: NCAT, EOMI, oral membranes moist Cards: reg rate  Chest: normal effort Abdomen: Soft, NT, ND Skin: dry, intact Extremities: no edema Psych: pleasant and appropriate  Neuro: Cognitively is appropriate with good insight and awareness.  Cranial nerve exam is nonfocal.  He still has slightly decreased light touch in both of his legs.  Strength is generally 4 out of 5 in both hip flexors and knee extensors.  Her elliptical dorsiflexion is 3 - to 3 out of 5 on the right.  Left lower extremity ankle movement is 3+ to 4 out of 5.  Gait is stable with his walker.  There is some clonus 4-5 beats in the right ankle with range of motion today. Musculoskeletal: Full ROM, No pain with AROM or PROM in the neck, trunk, or extremities. Posture appropriate      Assessment & Plan:  1. Thoracic SCI  with incomplete injury 04/2014. Pt with persistent spastic paraplegia and myoclonus, sensory deficits and neurogenic bladder.  2. 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 Neurogenic Bladder with urinary retention     Plan:   1. Maintain HEP as possible.             -HEP as he's been doing. He stays active 2. Klonopin for myoclonus and resting tone continue at 1mg  in the a.m. and half a milligram in the p.m.  I wrote a new prescription to consolidate him down to 1 dose only (1 mg tablets)  3. Continue ultram which he takes at bedtime only-- refill this today         -We will continue the controlled substance monitoring program, this consists of regular clinic visits, examinations, routine drug screening, pill counts as well as use of [place] Controlled Substance Reporting System. [place]CCS[place] was reviewed today.   4. Urological follow up per Dr. Alyson Ingles prn            -voiding      15 minutes of face to face patient care time were spent during this visit. All questions were encouraged and answered.  Follow up with me in 6 mos .

## 2020-10-30 NOTE — Patient Instructions (Signed)
PLEASE FEEL FREE TO CALL OUR OFFICE WITH ANY PROBLEMS OR QUESTIONS ([phone])      

## 2021-04-14 ENCOUNTER — Other Ambulatory Visit: Payer: Self-pay | Admitting: Physical Medicine & Rehabilitation

## 2021-04-14 DIAGNOSIS — S24109S Unspecified injury at unspecified level of thoracic spinal cord, sequela: Secondary | ICD-10-CM

## 2021-04-14 DIAGNOSIS — G839 Paralytic syndrome, unspecified: Secondary | ICD-10-CM

## 2021-04-16 ENCOUNTER — Other Ambulatory Visit: Payer: Self-pay | Admitting: Physical Medicine & Rehabilitation

## 2021-04-16 DIAGNOSIS — S24109S Unspecified injury at unspecified level of thoracic spinal cord, sequela: Secondary | ICD-10-CM

## 2021-04-16 DIAGNOSIS — S069X2S Unspecified intracranial injury with loss of consciousness of 31 minutes to 59 minutes, sequela: Secondary | ICD-10-CM

## 2021-04-16 DIAGNOSIS — S069X5S Unspecified intracranial injury with loss of consciousness greater than 24 hours with return to pre-existing conscious level, sequela: Secondary | ICD-10-CM

## 2021-04-22 NOTE — Telephone Encounter (Signed)
Recent PMP report ? ?04/15/2021 04/15/2021 1  ?Clonazepam 1 Mg Tablet ?45.00 [place] [id number] Har [phone]) 0/2 3.00 LME Medicare [place] ?02/25/2021 10/30/2020 1  ?Clonazepam 1 Mg Tablet ?45.00 [place] [id number] Har [phone]) 2/2 3.00 LME Medicare [place] ?02/25/2021 10/30/2020 1  ?Tramadol Hcl 50 Mg Tablet ?60.00 [place] [id number] Har ([id number]) 2/2 10.00 MME Medicare [place] ?

## 2021-04-30 ENCOUNTER — Other Ambulatory Visit: Payer: Self-pay

## 2021-04-30 ENCOUNTER — Telehealth: Payer: Self-pay | Admitting: Physical Medicine & Rehabilitation

## 2021-04-30 ENCOUNTER — Encounter: Payer: Self-pay | Admitting: Physical Medicine & Rehabilitation

## 2021-04-30 ENCOUNTER — Encounter: Payer: Medicare HMO | Attending: Physical Medicine & Rehabilitation | Admitting: Physical Medicine & Rehabilitation

## 2021-04-30 VITALS — BP 132/87 | HR 74 | Ht 64.0 in | Wt 172.6 lb

## 2021-04-30 DIAGNOSIS — N319 Neuromuscular dysfunction of bladder, unspecified: Secondary | ICD-10-CM

## 2021-04-30 DIAGNOSIS — G253 Myoclonus: Secondary | ICD-10-CM

## 2021-04-30 DIAGNOSIS — S24109S Unspecified injury at unspecified level of thoracic spinal cord, sequela: Secondary | ICD-10-CM | POA: Diagnosis not present

## 2021-04-30 DIAGNOSIS — G839 Paralytic syndrome, unspecified: Secondary | ICD-10-CM | POA: Diagnosis not present

## 2021-04-30 DIAGNOSIS — Z79891 Long term (current) use of opiate analgesic: Secondary | ICD-10-CM

## 2021-04-30 DIAGNOSIS — S069X2S Unspecified intracranial injury with loss of consciousness of 31 minutes to 59 minutes, sequela: Secondary | ICD-10-CM

## 2021-04-30 DIAGNOSIS — S069X5S Unspecified intracranial injury with loss of consciousness greater than 24 hours with return to pre-existing conscious level, sequela: Secondary | ICD-10-CM

## 2021-04-30 DIAGNOSIS — G479 Sleep disorder, unspecified: Secondary | ICD-10-CM

## 2021-04-30 DIAGNOSIS — G894 Chronic pain syndrome: Secondary | ICD-10-CM | POA: Diagnosis not present

## 2021-04-30 DIAGNOSIS — Z5181 Encounter for therapeutic drug level monitoring: Secondary | ICD-10-CM

## 2021-04-30 MED ORDER — CLONAZEPAM 1 MG PO TABS: 1.0000 mg | ORAL_TABLET | Freq: Two times a day (BID) | ORAL | 2 refills | Status: DC

## 2021-04-30 NOTE — Patient Instructions (Signed)
INCREASE YOUR MELATONIN TO 2 TABLETS.  ? ?TAKE IT IN THE EVENING INSTEAD OF THE DAY.  ?

## 2021-04-30 NOTE — Progress Notes (Signed)
? ?Subjective:  ? ? Patient ID: Richard Bridges, male    DOB: 19-Jul-1975, 46 y.o.   MRN: [id number] ? ?HPI ? ?Develle is here in follow up of TBI/SCI. He has been doing fairly well since we last talked. He is getting ready for the planting season coming up. He also will be helping with cat sitting in May and going to [facility] in June.  ? ?He tried to reduce his klonopin and he struggled with sleep. His myoclonus has been controlled however. He is using melatonin but during the day.  He takes tramadol at bedtime to help with pain in his back and legs.   ? ?He remains continent of bladder.     ? ?Pain Inventory ?Average Pain 0 ?Pain Right Now 0 ?My pain is  No pain ? ?LOCATION OF PAIN  No pain ? ?BOWEL ?Number of stools per week: 7 ? ?BLADDER ?Normal ? ? ? ?Mobility ?use a walker ?how many minutes can you walk? 30 ?ability to climb steps?  yes ?do you drive?  no ?Do you have any goals in this area?  no ? ?Function ?disabled: date disabled 04/14/2014 ? ?Neuro/Psych ?No problems in this area ? ?Prior Studies ?Any changes since last visit?  no ? ?Physicians involved in your care ?Any changes since last visit?  no ? ? ?History reviewed. No pertinent family history. ?Social History  ? ?Socioeconomic History  ? Marital status: Single  ?  Spouse name: Not on file  ? Number of children: Not on file  ? Years of education: Not on file  ? Highest education level: Not on file  ?Occupational History  ? Not on file  ?Tobacco Use  ? Smoking status: Former  ? Smokeless tobacco: Never  ?Vaping Use  ? Vaping Use: Never used  ?Substance and Sexual Activity  ? Alcohol use: Yes  ? Drug use: No  ? Sexual activity: Not on file  ?Other Topics Concern  ? Not on file  ?Social History Narrative  ? Not on file  ? ?Social Determinants of Health  ? ?Financial Resource Strain: Not on file  ?Food Insecurity: Not on file  ?Transportation Needs: Not on file  ?Physical Activity: Not on file  ?Stress: Not on file  ?Social Connections: Not on file  ? ?Past  Surgical History:  ?Procedure Laterality Date  ? SPINE SURGERY  04/15/2014  ? laminectomy s/p multi-trauma  ? 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 s/p multi-trauma  ? ?Past Medical History:  ?Diagnosis Date  ? Fractures involving multiple body regions 04/14/2014  ? s/p multi-trauma with pelvic and spinal fractures  ? ?BP 132/87   Pulse 74   Ht '5\' 4"'$  (1.626 m)   Wt 172 lb 9.6 oz (78.3 kg)   SpO2 97%   BMI 29.63 kg/m?  ? ?Opioid Risk Score:   ?Fall Risk Score:  `1 ? ?Depression screen PHQ 2/9 ? ? ?  04/30/2021  ? 11:06 AM 11/01/2019  ? 10:30 AM 12/30/2015  ? 11:46 AM 04/10/2015  ? 10:59 AM 03/13/2015  ? 10:50 AM  ?Depression screen PHQ 2/9  ?Decreased Interest 0 0 0 0 0  ?Down, Depressed, Hopeless 0 0 0 0 0  ?PHQ - 2 Score 0 0 0 0 0  ?Altered sleeping     0  ?Tired, decreased energy     0  ?Change in appetite     0  ?Feeling bad or failure about yourself      0  ?Trouble concentrating  0  ?Moving slowly or fidgety/restless     0  ?Suicidal thoughts     0  ?PHQ-9 Score     0  ?  ? ?Review of Systems  ?Constitutional: Negative.   ?HENT: Negative.    ?Eyes: Negative.   ?Respiratory: Negative.    ?Cardiovascular: Negative.   ?Gastrointestinal: Negative.   ?Endocrine: Negative.   ?Genitourinary: Negative.   ?Musculoskeletal:  Positive for gait problem.  ?Skin: Negative.   ?Allergic/Immunologic: Negative.   ?Hematological: Negative.   ?Psychiatric/Behavioral: Negative.    ? ?   ?Objective:  ? Physical Exam ? ?General: No acute distress ?HEENT: NCAT, EOMI, oral membranes moist ?Cards: reg rate  ?Chest: normal effort ?Abdomen: Soft, NT, ND ?Skin: dry, intact ?Extremities: no edema ?Psych: pleasant and appropriate  ?Neuro: Cognitively is appropriate with good insight and awareness.  Cranial nerve exam is nonfocal.  He still has slightly decreased light touch in both of his legs.  Lower extremity strength is 4 - to 4 out of 5 in both hip flexors and knee extensors.  Ankle dorsiflexors and plantar flexors are grossly 3+ to 4 out of 5.  Uses  walker to help with his form during gait and utilizes good knee bend and ankle dorsiflexion with swing.  Minimal clonus noted today in either lower extremity. ?Musculoskeletal: Full ROM, No pain with AROM or PROM in the neck, trunk, or extremities. Posture appropriate  ?  ?  ?Assessment & Plan:  ?1. 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  ?2. 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 Neurogenic Bladder with urinary retention ?4.  Sleep disorder ?  ?Plan:   ?1.  Continue home exercise program as he is doing.  He stays active with exercise and activities around his home, particularly his garden. ?2. Continue klonopin '1mg'$  bid. Consider another medicine to help with sleep.  ?-will change melatonin to nightime, increase to 5-10 mg ?3. Continue ultram which he takes at bedtime only-- RF today ?       We will continue the controlled substance monitoring program, this consists of regular clinic visits, examinations, routine drug screening, pill counts as well as use of [place] Controlled Substance Reporting System. [place] was [place] today.   ?4. Urological follow up per Dr. Alyson Ingles prn   ?         -voiding and continently ? ?Fifteen minutes of face to face patient care time were spent during this visit. All questions were encouraged and answered.  Follow up with me in 6 MOS .  ? ?    ? ?

## 2021-04-30 NOTE — Telephone Encounter (Signed)
Patient is not taking melatonin is taking Magnesium 500 mg 1x per day  ?

## 2021-05-09 LAB — DRUG TOX MONITOR 1 W/CONF, ORAL FLD
Amphetamines: NEGATIVE ng/mL (ref <10)
Barbiturates: NEGATIVE ng/mL (ref <10)
Benzodiazepines: NEGATIVE ng/mL (ref <0.50)
Buprenorphine: NEGATIVE ng/mL (ref <0.10)
Cocaine: NEGATIVE ng/mL (ref <5.0)
Fentanyl: NEGATIVE ng/mL (ref <0.10)
Heroin Metabolite: NEGATIVE ng/mL (ref <1.0)
MARIJUANA: NEGATIVE ng/mL (ref <2.5)
MDMA: NEGATIVE ng/mL (ref <10)
Meprobamate: NEGATIVE ng/mL (ref <2.5)
Methadone: NEGATIVE ng/mL (ref <5.0)
Nicotine Metabolite: NEGATIVE ng/mL (ref <5.0)
Opiates: NEGATIVE ng/mL (ref <2.5)
Phencyclidine: NEGATIVE ng/mL (ref <10)
Tapentadol: NEGATIVE ng/mL (ref <5.0)
Tramadol: 409 ng/mL — ABNORMAL HIGH (ref <5.0)
Tramadol: POSITIVE ng/mL — AB (ref <5.0)
Zolpidem: NEGATIVE ng/mL (ref <5.0)

## 2021-05-09 LAB — DRUG TOX ALC METAB W/CON, ORAL FLD

## 2021-05-12 ENCOUNTER — Telehealth: Payer: Self-pay | Admitting: *Deleted

## 2021-05-12 NOTE — Telephone Encounter (Signed)
Oral swab drug screen was consistent for prescribed medications.  ?

## 2021-06-02 DIAGNOSIS — U071 COVID-19: Secondary | ICD-10-CM | POA: Diagnosis not present

## 2021-08-01 DIAGNOSIS — D473 Essential (hemorrhagic) thrombocythemia: Secondary | ICD-10-CM | POA: Diagnosis not present

## 2021-08-01 DIAGNOSIS — Z0000 Encounter for general adult medical examination without abnormal findings: Secondary | ICD-10-CM | POA: Diagnosis not present

## 2021-08-01 DIAGNOSIS — E782 Mixed hyperlipidemia: Secondary | ICD-10-CM | POA: Diagnosis not present

## 2021-08-01 DIAGNOSIS — Z1211 Encounter for screening for malignant neoplasm of colon: Secondary | ICD-10-CM | POA: Diagnosis not present

## 2021-08-01 DIAGNOSIS — Z9081 Acquired absence of spleen: Secondary | ICD-10-CM | POA: Diagnosis not present

## 2021-08-25 ENCOUNTER — Other Ambulatory Visit: Payer: Self-pay | Admitting: Physical Medicine & Rehabilitation

## 2021-08-25 DIAGNOSIS — G839 Paralytic syndrome, unspecified: Secondary | ICD-10-CM

## 2021-08-25 DIAGNOSIS — S24109S Unspecified injury at unspecified level of thoracic spinal cord, sequela: Secondary | ICD-10-CM

## 2021-09-26 ENCOUNTER — Other Ambulatory Visit: Payer: Self-pay | Admitting: Physical Medicine & Rehabilitation

## 2021-09-26 DIAGNOSIS — S069X2S Unspecified intracranial injury with loss of consciousness of 31 minutes to 59 minutes, sequela: Secondary | ICD-10-CM

## 2021-09-26 DIAGNOSIS — S24109S Unspecified injury at unspecified level of thoracic spinal cord, sequela: Secondary | ICD-10-CM

## 2021-09-26 DIAGNOSIS — S069X5S Unspecified intracranial injury with loss of consciousness greater than 24 hours with return to pre-existing conscious level, sequela: Secondary | ICD-10-CM

## 2021-11-12 ENCOUNTER — Encounter: Payer: Medicare HMO | Attending: Physical Medicine & Rehabilitation | Admitting: Physical Medicine & Rehabilitation

## 2021-11-12 ENCOUNTER — Encounter: Payer: Self-pay | Admitting: Physical Medicine & Rehabilitation

## 2021-11-12 VITALS — BP 134/83 | HR 72 | Ht 64.0 in | Wt 159.0 lb

## 2021-11-12 DIAGNOSIS — G839 Paralytic syndrome, unspecified: Secondary | ICD-10-CM | POA: Diagnosis not present

## 2021-11-12 DIAGNOSIS — G253 Myoclonus: Secondary | ICD-10-CM | POA: Diagnosis not present

## 2021-11-12 DIAGNOSIS — N319 Neuromuscular dysfunction of bladder, unspecified: Secondary | ICD-10-CM

## 2021-11-12 DIAGNOSIS — S24109S Unspecified injury at unspecified level of thoracic spinal cord, sequela: Secondary | ICD-10-CM

## 2021-11-12 DIAGNOSIS — S069X3S Unspecified intracranial injury with loss of consciousness of 1 hour to 5 hours 59 minutes, sequela: Secondary | ICD-10-CM | POA: Diagnosis not present

## 2021-11-12 NOTE — Patient Instructions (Signed)
PLEASE FEEL FREE TO CALL OUR OFFICE WITH ANY PROBLEMS OR QUESTIONS ([phone])      

## 2021-11-12 NOTE — Progress Notes (Signed)
Subjective:    Patient ID: Richard Bridges, male    DOB: 08/18/1975, 46 y.o.   MRN: [id number]  HPI  Emily is here in follow up his TBI/SCI. He was busy with his garden this summer.  He grew peppers, cucumbers among other things. 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   Health-wise he's doing well. No changes reported  Pain is controlled. He'll use tramadol occasionally for severe.   He is using melatonin and tramadol at night for sleep. He no longer uses klonopin. His klonopin is most effective in the morning for his myoclonus. It strangely makes him feel more alert and restless at night so now he's only using '1mg'$  daily.   He has started using an aircast which he feels allows more normal movement of his right foot. He denies any tripping or falling d/t botox.     Pain Inventory Average Pain 0 Pain Right Now 0 My pain is  no pain  LOCATION OF PAIN  n/a  BOWEL Number of stools per week: 1   BLADDER Normal    Mobility use a walker how many minutes can you walk? 30 ability to climb steps?  yes do you drive?  no  Function not employed: date last employed .  Neuro/Psych No problems in this area  Prior Studies Any changes since last visit?  no  Physicians involved in your care Any changes since last visit?  no   No family history on file. Social History   Socioeconomic History   Marital status: Single    Spouse name: Not on file   Number of children: Not on file   Years of education: Not on file   Highest education level: Not on file  Occupational History   Not on file  Tobacco Use   Smoking status: Former   Smokeless tobacco: Never  Vaping Use   Vaping Use: Never used  Substance and Sexual Activity   Alcohol use: Yes   Drug use: No   Sexual activity: Not on file  Other Topics Concern   Not on file  Social History Narrative   Not on file   Social Determinants of Health   Financial Resource  Strain: Not on file  Food Insecurity: Not on file  Transportation Needs: Not on file  Physical Activity: Not on file  Stress: Not on file  Social Connections: Not on file   Past Surgical History:  Procedure Laterality Date   SPINE SURGERY  04/15/2014   laminectomy s/p multi-trauma   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   Past Medical History:  Diagnosis Date   Fractures involving multiple body regions 04/14/2014   s/p multi-trauma with pelvic and spinal fractures   BP 134/83   Pulse 72   Ht '5\' 4"'$  (1.626 m)   Wt 159 lb (72.1 kg)   SpO2 96%   BMI 27.29 kg/m   Opioid Risk Score:   Fall Risk Score:  `1  Depression screen PHQ 2/9     11/12/2021    1:10 PM 04/30/2021   11:06 AM 11/01/2019   10:30 AM 12/30/2015   11:46 AM 04/10/2015   10:59 AM 03/13/2015   10:50 AM  Depression screen PHQ 2/9  Decreased Interest 0 0 0 0 0 0  Down, Depressed, Hopeless 0 0 0 0 0 0  PHQ - 2 Score 0 0 0 0 0 0  Altered sleeping  0  Tired, decreased energy      0  Change in appetite      0  Feeling bad or failure about yourself       0  Trouble concentrating      0  Moving slowly or fidgety/restless      0  Suicidal thoughts      0  PHQ-9 Score      0     Review of Systems  Constitutional: Negative.   HENT: Negative.    Eyes: Negative.   Respiratory: Negative.    Cardiovascular: Negative.   Gastrointestinal: Negative.   Endocrine: Negative.   Genitourinary: Negative.   Musculoskeletal:  Positive for gait problem.  Skin: Negative.   Allergic/Immunologic: Negative.   Hematological: Negative.   Psychiatric/Behavioral: Negative.        Objective:   Physical Exam  General: No acute distress HEENT: NCAT, EOMI, oral membranes moist Cards: reg rate  Chest: normal effort Abdomen: Soft, NT, ND Skin: dry, intact Extremities: no edema Psych: pleasant and appropriate  Neuro: Cognitively is appropriate with good insight and awareness.  Cranial nerve exam is nonfocal.  He still has  slightly decreased light touch in both of his legs.  Lower extremity strength is 4 - to 4 out of 5 in both hip flexors and knee extensors.  Ankle dorsiflexors and plantar flexors are 3+ out of 5.  uses air cast and has to be aware that toes come up as he initiates swing.  Intermittent clonus noted today in either lower extremity, noted when he initiates movement Musculoskeletal: Full ROM, No pain with AROM or PROM in the neck, trunk, or extremities. Posture appropriate      Assessment & Plan:  1. 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  2. 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 Neurogenic Bladder with urinary retention 4.  Sleep disorder   Plan:   1.  I'm ok with the air cast as long as he's conscious of keeping his toe up 2. Continue klonopin '1mg'$  daily only  -melatonin '5mg'$  at night 3. Continue ultram which he takes at bedtime only-- no RF today        We will continue the controlled substance monitoring program, this consists of regular clinic visits, examinations, routine drug screening, pill counts as well as use of [place] Controlled Substance Reporting System. [place] was reviewed today.   4. Urological follow up per Dr. Alyson Ingles prn            -voiding and continently   Fifteen minutes of face to face patient care time were spent during this visit. All questions were encouraged and answered.  Follow up with me in 6 MOS .

## 2021-12-04 DIAGNOSIS — R0602 Shortness of breath: Secondary | ICD-10-CM | POA: Diagnosis not present

## 2022-01-12 ENCOUNTER — Other Ambulatory Visit: Payer: Self-pay | Admitting: Physical Medicine & Rehabilitation

## 2022-01-12 DIAGNOSIS — G839 Paralytic syndrome, unspecified: Secondary | ICD-10-CM

## 2022-01-12 DIAGNOSIS — S24109S Unspecified injury at unspecified level of thoracic spinal cord, sequela: Secondary | ICD-10-CM

## 2022-02-24 ENCOUNTER — Telehealth: Payer: Self-pay

## 2022-02-24 ENCOUNTER — Other Ambulatory Visit: Payer: Self-pay | Admitting: Physical Medicine & Rehabilitation

## 2022-02-24 DIAGNOSIS — S069X2S Unspecified intracranial injury with loss of consciousness of 31 minutes to 59 minutes, sequela: Secondary | ICD-10-CM

## 2022-02-24 DIAGNOSIS — N319 Neuromuscular dysfunction of bladder, unspecified: Secondary | ICD-10-CM

## 2022-02-24 DIAGNOSIS — S24109S Unspecified injury at unspecified level of thoracic spinal cord, sequela: Secondary | ICD-10-CM

## 2022-02-24 DIAGNOSIS — R339 Retention of urine, unspecified: Secondary | ICD-10-CM

## 2022-02-24 DIAGNOSIS — S069X5S Unspecified intracranial injury with loss of consciousness greater than 24 hours with return to pre-existing conscious level, sequela: Secondary | ICD-10-CM

## 2022-02-24 NOTE — Telephone Encounter (Signed)
Patient said they need an Rx for self catheters

## 2022-02-25 ENCOUNTER — Telehealth: Payer: Self-pay | Admitting: Physical Medicine & Rehabilitation

## 2022-02-25 NOTE — Telephone Encounter (Signed)
Type self cath [place], ref [id number] / [id number], Urinary Catheter straight tip Male, FR14/16 - Quantity # 30.  If you can write the script they will have it filled.

## 2022-02-25 NOTE — Telephone Encounter (Signed)
Following up on request for prescription for catheters. Please call

## 2022-02-25 NOTE — Telephone Encounter (Signed)
Please see prior encounter.  

## 2022-02-26 NOTE — Telephone Encounter (Signed)
Ordered printed,  wrote on script and left at front desk for pt to pick up, wife was contacted

## 2022-02-26 NOTE — Telephone Encounter (Signed)
I entered an order into Epic. It will need to be printed out over at the office.   Thanks

## 2022-03-23 DIAGNOSIS — Z1211 Encounter for screening for malignant neoplasm of colon: Secondary | ICD-10-CM | POA: Diagnosis not present

## 2022-03-23 DIAGNOSIS — K648 Other hemorrhoids: Secondary | ICD-10-CM | POA: Diagnosis not present

## 2022-03-30 DIAGNOSIS — H524 Presbyopia: Secondary | ICD-10-CM | POA: Diagnosis not present

## 2022-05-20 ENCOUNTER — Encounter: Payer: Medicare HMO | Attending: Physical Medicine & Rehabilitation | Admitting: Physical Medicine & Rehabilitation

## 2022-05-20 ENCOUNTER — Encounter: Payer: Self-pay | Admitting: Physical Medicine & Rehabilitation

## 2022-05-20 DIAGNOSIS — S24109S Unspecified injury at unspecified level of thoracic spinal cord, sequela: Secondary | ICD-10-CM

## 2022-05-20 DIAGNOSIS — G839 Paralytic syndrome, unspecified: Secondary | ICD-10-CM

## 2022-05-20 MED ORDER — CLONAZEPAM 1 MG PO TABS: ORAL_TABLET | ORAL | 2 refills | Status: DC

## 2022-05-20 NOTE — Patient Instructions (Signed)
ALWAYS FEEL FREE TO CALL OUR OFFICE WITH ANY PROBLEMS OR QUESTIONS ([phone])  **PLEASE NOTE** ALL MEDICATION REFILL REQUESTS (INCLUDING CONTROLLED SUBSTANCES) NEED TO BE MADE AT LEAST 7 DAYS PRIOR TO REFILL BEING DUE. ANY REFILL REQUESTS INSIDE THAT TIME FRAME MAY RESULT IN DELAYS IN RECEIVING YOUR PRESCRIPTION.                    

## 2022-05-20 NOTE — Progress Notes (Signed)
Subjective:    Patient ID: Richard Bridges, male    DOB: 01-15-1976, 47 y.o.   MRN: [id number]  HPI  Richard Bridges is here in follow up of his SCI and TBI. He remains active and independent. He ambulates with his RW and aircast on the RLE. He is working on his garden for this spring. He wants to get a car and start working again, at least part time.   He remains regular with his bowels and bladder. His clonus is controlled with klonopin which also helps him sleep. He uses tramadol for severe pain, typically after he's been very active on a given day.   Mood has been up beat. He remains involved with his family   Pain Inventory Average Pain 0 Pain Right Now 0 My pain is intermittent and aching  LOCATION OF PAIN  back  BOWEL Number of stools per week: 2 Oral laxative use Yes  Type of laxative Colace  BLADDER Normal   Mobility use a walker how many minutes can you walk? 30 ability to climb steps?  yes do you drive?  no Do you have any goals in this area?  yes  Function disabled: date disabled 2016  Neuro/Psych No problems in this area  Prior Studies Any changes since last visit?  no  Physicians involved in your care Any changes since last visit?  no   History reviewed. No pertinent family history. Social History   Socioeconomic History   Marital status: Single    Spouse name: Not on file   Number of children: Not on file   Years of education: Not on file   Highest education level: Not on file  Occupational History   Not on file  Tobacco Use   Smoking status: Former   Smokeless tobacco: Never  Vaping Use   Vaping Use: Never used  Substance and Sexual Activity   Alcohol use: Yes    Comment: rare   Drug use: No   Sexual activity: Not on file  Other Topics Concern   Not on file  Social History Narrative   Not on file   Social Determinants of Health   Financial Resource Strain: Not on file  Food Insecurity: Not on file  Transportation Needs: Not on  file  Physical Activity: Not on file  Stress: Not on file  Social Connections: Not on file   Past Surgical History:  Procedure Laterality Date   SPINE SURGERY  04/15/2014   laminectomy s/p multi-trauma   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   Past Medical History:  Diagnosis Date   Fractures involving multiple body regions 04/14/2014   s/p multi-trauma with pelvic and spinal fractures   Ht 5\' 4"  (1.626 m)   BMI 27.29 kg/m   Opioid Risk Score:   Fall Risk Score:  `1  Depression screen [facility] 2/9     05/20/2022   11:39 AM 11/12/2021    1:10 PM 04/30/2021   11:06 AM 11/01/2019   10:30 AM 12/30/2015   11:46 AM 04/10/2015   10:59 AM 03/13/2015   10:50 AM  Depression screen PHQ 2/9  Decreased Interest 0 0 0 0 0 0 0  Down, Depressed, Hopeless 0 0 0 0 0 0 0  PHQ - 2 Score 0 0 0 0 0 0 0  Altered sleeping       0  Tired, decreased energy       0  Change in appetite       0  Feeling bad or failure about yourself        0  Trouble concentrating       0  Moving slowly or fidgety/restless       0  Suicidal thoughts       0  PHQ-9 Score       0    Review of Systems  Musculoskeletal:  Positive for gait problem.  All other systems reviewed and are negative.      Objective:   Physical Exam  General: No acute distress HEENT: NCAT, EOMI, oral membranes moist Cards: reg rate  Chest: normal effort Abdomen: Soft, NT, ND Skin: dry, intact Extremities: no edema Psych: pleasant and appropriate  Neuro: Cognitively is appropriate with good insight and awareness.  Cranial nerve exam is nonfocal.  He still has slightly decreased light touch in both of his legs.  Lower extremity strength is 4 - to 4 out of 5 in both hip flexors and knee extensors.  Ankle dorsiflexors and plantar flexors are 3+ out of 5. Uses air cast for RLE support, tends to circumduct the leg a bit to help with clearance. Is very stable however.  Minimal clonus today. Musculoskeletal: Full ROM, No pain with AROM or PROM in  the neck, trunk, or extremities. Posture appropriate      Assessment & Plan:  1. 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  2. 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 Neurogenic Bladder with urinary retention 4.  Sleep disorder   Plan:   Continue with air cast for gait. His gait is by no means perfect but he functions with it and is steady.  2. Continue klonopin 1mg  daily only --refilled today -melatonin 5mg  at night 3. Continue ultram which he takes at bedtime only--        We will continue the controlled substance monitoring program, this consists of regular clinic visits, examinations, routine drug screening, pill counts as well as use of [place] Controlled Substance Reporting System. [place] was reviewed today.        -no refills needed on tramadol today 4. Urological follow up per Dr. Ronne Binning prn            -voiding and continently   Fifteen minutes of face to face patient care time were spent during this visit. All questions were encouraged and answered.  Follow up with me in 6 MOS .

## 2022-07-20 ENCOUNTER — Other Ambulatory Visit: Payer: Self-pay | Admitting: Physical Medicine & Rehabilitation

## 2022-07-20 DIAGNOSIS — S069X5S Unspecified intracranial injury with loss of consciousness greater than 24 hours with return to pre-existing conscious level, sequela: Secondary | ICD-10-CM

## 2022-07-20 DIAGNOSIS — S24109S Unspecified injury at unspecified level of thoracic spinal cord, sequela: Secondary | ICD-10-CM

## 2022-07-20 DIAGNOSIS — S069X2S Unspecified intracranial injury with loss of consciousness of 31 minutes to 59 minutes, sequela: Secondary | ICD-10-CM

## 2022-09-16 DIAGNOSIS — G839 Paralytic syndrome, unspecified: Secondary | ICD-10-CM | POA: Diagnosis not present

## 2022-09-16 DIAGNOSIS — Z0000 Encounter for general adult medical examination without abnormal findings: Secondary | ICD-10-CM | POA: Diagnosis not present

## 2022-09-16 DIAGNOSIS — S062X2S Diffuse traumatic brain injury with loss of consciousness of 31 minutes to 59 minutes, sequela: Secondary | ICD-10-CM | POA: Diagnosis not present

## 2022-09-16 DIAGNOSIS — S24109S Unspecified injury at unspecified level of thoracic spinal cord, sequela: Secondary | ICD-10-CM | POA: Diagnosis not present

## 2022-09-16 DIAGNOSIS — Z6824 Body mass index (BMI) 24.0-24.9, adult: Secondary | ICD-10-CM | POA: Diagnosis not present

## 2022-09-16 DIAGNOSIS — D473 Essential (hemorrhagic) thrombocythemia: Secondary | ICD-10-CM | POA: Diagnosis not present

## 2022-09-16 DIAGNOSIS — E782 Mixed hyperlipidemia: Secondary | ICD-10-CM | POA: Diagnosis not present

## 2022-10-13 ENCOUNTER — Other Ambulatory Visit: Payer: Self-pay | Admitting: Physical Medicine & Rehabilitation

## 2022-10-13 DIAGNOSIS — G839 Paralytic syndrome, unspecified: Secondary | ICD-10-CM

## 2022-10-13 DIAGNOSIS — S24109S Unspecified injury at unspecified level of thoracic spinal cord, sequela: Secondary | ICD-10-CM

## 2022-11-18 ENCOUNTER — Encounter: Payer: Medicare HMO | Attending: Physical Medicine & Rehabilitation | Admitting: Physical Medicine & Rehabilitation

## 2022-11-18 ENCOUNTER — Encounter: Payer: Self-pay | Admitting: Physical Medicine & Rehabilitation

## 2022-11-18 VITALS — BP 124/73 | HR 88 | Ht 64.0 in | Wt 160.0 lb

## 2022-11-18 DIAGNOSIS — G253 Myoclonus: Secondary | ICD-10-CM | POA: Insufficient documentation

## 2022-11-18 DIAGNOSIS — S069X3S Unspecified intracranial injury with loss of consciousness of 1 hour to 5 hours 59 minutes, sequela: Secondary | ICD-10-CM | POA: Diagnosis not present

## 2022-11-18 DIAGNOSIS — S24109S Unspecified injury at unspecified level of thoracic spinal cord, sequela: Secondary | ICD-10-CM | POA: Insufficient documentation

## 2022-11-18 NOTE — Progress Notes (Signed)
Subjective:    Patient ID: Richard Bridges, male    DOB: 1975/03/05, 47 y.o.   MRN: [id number]  HPI  Richard Bridges is here in follow up of his TBI and SCI. He has been doing fairly for the most part. He has been working on a deck which needs repair d/t rotted joists. He has been feeling well enough to be crawling under the deck.   Bowels and bladder have remained steady. Spasms are under control.   He uses tramadol still only at night. He uses klonopin twice daily.            Pain Inventory Average Pain 0 Pain Right Now 0 My pain is  No Pain  LOCATION OF PAIN  N/A  BOWEL Number of stools per week: 2  BLADDER Normal    Mobility use a walker how many minutes can you walk? 30 minutes ability to climb steps?  yes do you drive?  no Do you have any goals in this area?  no  Function disabled: date disabled .  Neuro/Psych No problems in this area  Prior Studies Any changes since last visit?  no  Physicians involved in your care Any changes since last visit?  no   History reviewed. No pertinent family history. Social History   Socioeconomic History   Marital status: Single    Spouse name: Not on file   Number of children: Not on file   Years of education: Not on file   Highest education level: Not on file  Occupational History   Not on file  Tobacco Use   Smoking status: Former   Smokeless tobacco: Never  Vaping Use   Vaping status: Never Used  Substance and Sexual Activity   Alcohol use: Yes    Comment: rare   Drug use: No   Sexual activity: Not on file  Other Topics Concern   Not on file  Social History Narrative   Not on file   Social Determinants of Health   Financial Resource Strain: Not on file  Food Insecurity: Not on file  Transportation Needs: Not on file  Physical Activity: Not on file  Stress: Not on file  Social Connections: Not on file   Past Surgical History:  Procedure Laterality Date   SPINE SURGERY  04/15/2014   laminectomy  s/p multi-trauma   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   Past Medical History:  Diagnosis Date   Fractures involving multiple body regions 04/14/2014   s/p multi-trauma with pelvic and spinal fractures   BP 124/73   Pulse 88   Ht 5\' 4"  (1.626 m)   Wt 160 lb (72.6 kg)   SpO2 98%   BMI 27.46 kg/m   Opioid Risk Score:   Fall Risk Score:  `1  Depression screen [facility] 2/9     11/18/2022   10:56 AM 05/20/2022   11:39 AM 11/12/2021    1:10 PM 04/30/2021   11:06 AM 11/01/2019   10:30 AM 12/30/2015   11:46 AM 04/10/2015   10:59 AM  Depression screen PHQ 2/9  Decreased Interest 0 0 0 0 0 0 0  Down, Depressed, Hopeless 0 0 0 0 0 0 0  PHQ - 2 Score 0 0 0 0 0 0 0      Review of Systems  All other systems reviewed and are negative.     Objective:   Physical Exam General: No acute distress HEENT: NCAT, EOMI, oral membranes moist Cards: reg rate  Chest: normal effort Abdomen: Soft, NT, ND Skin: dry, intact Extremities: no edema Psych: pleasant and appropriate  Neuro: Cognitively is appropriate with good insight and awareness.  Cranial nerve exam is nonfocal.  He still has slightly decreased light touch in both of his legs.  Lower extremity strength is 4 - to 4 out of 5 in both hip flexors and knee extensors.  Ankle dorsiflexors and plantar flexors are 3+ out of 5. Still walks with air cast.  Is very stable however.  Minimal clonus today. Neuro exam is at baseline. 11/18/22 Musculoskeletal: Full ROM, No pain with AROM or PROM in the neck, trunk, or extremities. Posture appropriate      Assessment & Plan:  1. 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  2. 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 Neurogenic Bladder with urinary retention 4.  Sleep disorder   Plan:   Continue with air cast for gait. This works for him 2. Continue klonopin 1mg  daily only --no refill needed today -melatonin 5mg  at night 3.  Continue ultram which he takes at bedtime only--       .outpt      -no refills needed today 4. Urological follow up per Dr. Ronne Binning prn            -voiding and continently   Fifteen minutes of face to face patient care time were spent during this visit. All questions were encouraged and answered.  Follow up with me in 6 MOS .

## 2022-11-18 NOTE — Patient Instructions (Signed)
ALWAYS FEEL FREE TO CALL OUR OFFICE WITH ANY PROBLEMS OR QUESTIONS ([phone])  **PLEASE NOTE** ALL MEDICATION REFILL REQUESTS (INCLUDING CONTROLLED SUBSTANCES) NEED TO BE MADE AT LEAST 7 DAYS PRIOR TO REFILL BEING DUE. ANY REFILL REQUESTS INSIDE THAT TIME FRAME MAY RESULT IN DELAYS IN RECEIVING YOUR PRESCRIPTION.                    

## 2023-01-03 ENCOUNTER — Other Ambulatory Visit: Payer: Self-pay | Admitting: Physical Medicine & Rehabilitation

## 2023-01-03 DIAGNOSIS — S069X2S Unspecified intracranial injury with loss of consciousness of 31 minutes to 59 minutes, sequela: Secondary | ICD-10-CM

## 2023-01-03 DIAGNOSIS — S24109S Unspecified injury at unspecified level of thoracic spinal cord, sequela: Secondary | ICD-10-CM

## 2023-01-03 DIAGNOSIS — S069X5S Unspecified intracranial injury with loss of consciousness greater than 24 hours with return to pre-existing conscious level, sequela: Secondary | ICD-10-CM

## 2023-02-28 ENCOUNTER — Other Ambulatory Visit: Payer: Self-pay | Admitting: Physical Medicine & Rehabilitation

## 2023-02-28 DIAGNOSIS — S24109S Unspecified injury at unspecified level of thoracic spinal cord, sequela: Secondary | ICD-10-CM

## 2023-02-28 DIAGNOSIS — G839 Paralytic syndrome, unspecified: Secondary | ICD-10-CM

## 2023-05-19 ENCOUNTER — Encounter: Payer: Self-pay | Admitting: Physical Medicine & Rehabilitation

## 2023-05-19 ENCOUNTER — Encounter: Payer: Medicare HMO | Attending: Physical Medicine & Rehabilitation | Admitting: Physical Medicine & Rehabilitation

## 2023-05-19 VITALS — BP 139/94 | HR 95 | Ht 64.0 in | Wt 171.0 lb

## 2023-05-19 DIAGNOSIS — S069X3D Unspecified intracranial injury with loss of consciousness of 1 hour to 5 hours 59 minutes, subsequent encounter: Secondary | ICD-10-CM | POA: Insufficient documentation

## 2023-05-19 DIAGNOSIS — S24109D Unspecified injury at unspecified level of thoracic spinal cord, subsequent encounter: Secondary | ICD-10-CM | POA: Diagnosis not present

## 2023-05-19 DIAGNOSIS — G894 Chronic pain syndrome: Secondary | ICD-10-CM | POA: Insufficient documentation

## 2023-05-19 DIAGNOSIS — Z5181 Encounter for therapeutic drug level monitoring: Secondary | ICD-10-CM | POA: Insufficient documentation

## 2023-05-19 DIAGNOSIS — G253 Myoclonus: Secondary | ICD-10-CM | POA: Insufficient documentation

## 2023-05-19 DIAGNOSIS — Z79899 Other long term (current) drug therapy: Secondary | ICD-10-CM | POA: Insufficient documentation

## 2023-05-19 MED ORDER — TRAMADOL HCL 50 MG PO TABS: 50.0000 mg | ORAL_TABLET | Freq: Two times a day (BID) | ORAL | 3 refills | Status: DC | PRN
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
## 2023-05-22 LAB — DRUG TOX MONITOR 1 W/CONF, ORAL FLD
Alprazolam: NEGATIVE ng/mL (ref <0.50)
Aminoclonazepam: 2.07 ng/mL — ABNORMAL HIGH (ref <0.50)
Amphetamines: NEGATIVE ng/mL (ref <10)
Barbiturates: NEGATIVE ng/mL (ref <10)
Benzodiazepines: POSITIVE ng/mL — AB (ref <0.50)
Buprenorphine: NEGATIVE ng/mL (ref <0.10)
Chlordiazepoxide: NEGATIVE ng/mL (ref <0.50)
Clonazepam: 0.88 ng/mL — ABNORMAL HIGH (ref <0.50)
Cocaine: NEGATIVE ng/mL (ref <5.0)
Cotinine: 144.1 ng/mL — ABNORMAL HIGH (ref <5.0)
Diazepam: NEGATIVE ng/mL (ref <0.50)
Fentanyl: NEGATIVE ng/mL (ref <0.10)
Flunitrazepam: NEGATIVE ng/mL (ref <0.50)
Flurazepam: NEGATIVE ng/mL (ref <0.50)
Heroin Metabolite: NEGATIVE ng/mL (ref <1.0)
Lorazepam: NEGATIVE ng/mL (ref <0.50)
MARIJUANA: NEGATIVE ng/mL (ref <2.5)
MDMA: NEGATIVE ng/mL (ref <10)
Meprobamate: NEGATIVE ng/mL (ref <2.5)
Methadone: NEGATIVE ng/mL (ref <5.0)
Midazolam: NEGATIVE ng/mL (ref <0.50)
Nicotine Metabolite: POSITIVE ng/mL — AB (ref <5.0)
Nordiazepam: NEGATIVE ng/mL (ref <0.50)
Opiates: NEGATIVE ng/mL (ref <2.5)
Oxazepam: NEGATIVE ng/mL (ref <0.50)
Phencyclidine: NEGATIVE ng/mL (ref <10)
Tapentadol: NEGATIVE ng/mL (ref <5.0)
Temazepam: NEGATIVE ng/mL (ref <0.50)
Tramadol: >500 ng/mL — ABNORMAL HIGH (ref <5.0)
Tramadol: POSITIVE ng/mL — AB (ref <5.0)
Triazolam: NEGATIVE ng/mL (ref <0.50)
Zolpidem: NEGATIVE ng/mL (ref <5.0)

## 2023-05-22 LAB — DRUG TOX ALC METAB W/CON, ORAL FLD: Alcohol Metabolite: NEGATIVE ng/mL (ref <25)

## 2023-09-07 ENCOUNTER — Other Ambulatory Visit: Payer: Self-pay | Admitting: Physical Medicine & Rehabilitation

## 2023-09-07 DIAGNOSIS — S24109S Unspecified injury at unspecified level of thoracic spinal cord, sequela: Secondary | ICD-10-CM

## 2023-09-07 DIAGNOSIS — G839 Paralytic syndrome, unspecified: Secondary | ICD-10-CM

## 2023-09-13 DIAGNOSIS — H524 Presbyopia: Secondary | ICD-10-CM | POA: Diagnosis not present

## 2023-09-24 DIAGNOSIS — D473 Essential (hemorrhagic) thrombocythemia: Secondary | ICD-10-CM | POA: Diagnosis not present

## 2023-09-24 DIAGNOSIS — E782 Mixed hyperlipidemia: Secondary | ICD-10-CM | POA: Diagnosis not present

## 2023-10-22 ENCOUNTER — Other Ambulatory Visit: Payer: Self-pay

## 2023-10-22 DIAGNOSIS — S24109S Unspecified injury at unspecified level of thoracic spinal cord, sequela: Secondary | ICD-10-CM

## 2023-10-22 DIAGNOSIS — G839 Paralytic syndrome, unspecified: Secondary | ICD-10-CM

## 2023-10-22 MED ORDER — CLONAZEPAM 1 MG PO TABS: ORAL_TABLET | ORAL | 3 refills | Status: DC

## 2023-10-28 ENCOUNTER — Inpatient Hospital Stay

## 2023-10-28 ENCOUNTER — Inpatient Hospital Stay: Attending: Hematology and Oncology | Admitting: Hematology and Oncology

## 2023-10-28 VITALS — BP 165/95 | HR 106 | Temp 98.8°F | Resp 17 | Wt 170.5 lb

## 2023-10-28 DIAGNOSIS — Z7982 Long term (current) use of aspirin: Secondary | ICD-10-CM | POA: Insufficient documentation

## 2023-10-28 DIAGNOSIS — D75839 Thrombocytosis, unspecified: Secondary | ICD-10-CM | POA: Diagnosis not present

## 2023-10-28 DIAGNOSIS — Z9081 Acquired absence of spleen: Secondary | ICD-10-CM | POA: Insufficient documentation

## 2023-10-28 DIAGNOSIS — Z87891 Personal history of nicotine dependence: Secondary | ICD-10-CM | POA: Diagnosis not present

## 2023-10-28 NOTE — Assessment & Plan Note (Signed)
 Lab review 07/24/2019: WBC 12.8, hemoglobin 15.8, platelets 515 07/24/2020: WBC 11.5, hemoglobin 15.6, platelets 532 08/01/2021: WBC 10.8, hemoglobin 15.3, platelets 585 09/16/2022: WBC 11.4, hemoglobin 15.1, platelets 497 09/24/2023: WBC 12.5, hemoglobin 15.5, platelets 505  Differential diagnosis 1. Primary thrombocytosis: Related to myeloproliferative disorders of the bone marrow especially essential thrombocytosis and CML. I would like to send for BCR-ABL as well as JAK-2 dictation testings. Patient understands that JAK2 mutation is only present in 50% of essential thrombocytosis so the test is advantageous only if it is positive. If it is negative, it does not rule out. 2. Secondary/reactive thrombocytosis Different causes including infections, inflammation, iron deficiency.  I would like to send out for C-reactive protein, iron studies with ferritin to complete the workup.  Treatment options: 1. If it is primary essential thrombocytosis, treatment would depend on platelet count level as well as history of thrombosis. A. For low risk patients, (platelet counts less than 1000 and no history of blood clots) the treatment would be with aspirin therapy B. for high risk patients(platelet counts greater than 1000/history of blood clot) the treatment would be platelet lowering therapy with aspirin 2. Treatment of secondary thrombocytosis would be to treat underlying cause. There would not be any risk of thrombosis with secondary thrombocytosis.  Return to clinic in 3 weeks to discuss the results of these tests.
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
## 2023-11-17 ENCOUNTER — Encounter: Admitting: Physical Medicine & Rehabilitation

## 2023-12-08 ENCOUNTER — Encounter: Attending: Physical Medicine & Rehabilitation | Admitting: Physical Medicine & Rehabilitation

## 2023-12-08 ENCOUNTER — Encounter: Payer: Self-pay | Admitting: Physical Medicine & Rehabilitation

## 2023-12-08 ENCOUNTER — Telehealth: Payer: Self-pay | Admitting: Physical Medicine & Rehabilitation

## 2023-12-08 VITALS — BP 168/92 | HR 89 | Ht 64.0 in | Wt 174.6 lb

## 2023-12-08 DIAGNOSIS — G253 Myoclonus: Secondary | ICD-10-CM | POA: Diagnosis not present

## 2023-12-08 DIAGNOSIS — S069X3D Unspecified intracranial injury with loss of consciousness of 1 hour to 5 hours 59 minutes, subsequent encounter: Secondary | ICD-10-CM | POA: Insufficient documentation

## 2023-12-08 DIAGNOSIS — S24109D Unspecified injury at unspecified level of thoracic spinal cord, subsequent encounter: Secondary | ICD-10-CM | POA: Insufficient documentation
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
## 2023-12-08 NOTE — Telephone Encounter (Signed)
 Sister called wanted to know if she can get a referral for [employer] for fitness.  She said you had discussed it before but he wasn't ready and now he is.

## 2023-12-08 NOTE — Patient Instructions (Addendum)
 ALWAYS FEEL FREE TO CALL OUR OFFICE WITH ANY PROBLEMS OR QUESTIONS [phone])  **PLEASE NOTE** ALL MEDICATION REFILL REQUESTS (INCLUDING CONTROLLED SUBSTANCES) NEED TO BE MADE AT LEAST 7 DAYS PRIOR TO REFILL BEING DUE. ANY REFILL REQUESTS INSIDE THAT TIME FRAME MAY RESULT IN DELAYS IN RECEIVING YOUR PRESCRIPTION.     [facility]: [email or web address]  [phone]    [address] [place], [place] [address]    [facility] [email or web address] Address: [address] [place], [place] [address] Phone: [phone] Hours:  Open ? Closes 4?PM

## 2023-12-09 NOTE — Telephone Encounter (Signed)
 Called and spoke with sister. He wants to work out at [employer]. Shouldn't need a note.

## 2023-12-31 ENCOUNTER — Other Ambulatory Visit: Payer: Self-pay | Admitting: Physical Medicine & Rehabilitation

## 2023-12-31 DIAGNOSIS — G894 Chronic pain syndrome: Secondary | ICD-10-CM

## 2024-06-07 ENCOUNTER — Encounter: Admitting: Physical Medicine & Rehabilitation
